# Patient Record
Sex: Female | Born: 1975 | Race: Black or African American | Hispanic: No | Smoking: Former smoker
Health system: Southern US, Community
[De-identification: ages and names within clinical notes are randomized; demographics above are authoritative.]

## PROBLEM LIST (undated history)

## (undated) DIAGNOSIS — K8020 Calculus of gallbladder without cholecystitis without obstruction: Secondary | ICD-10-CM

## (undated) DIAGNOSIS — D249 Benign neoplasm of unspecified breast: Secondary | ICD-10-CM

## (undated) DIAGNOSIS — J209 Acute bronchitis, unspecified: Secondary | ICD-10-CM

## (undated) DIAGNOSIS — K625 Hemorrhage of anus and rectum: Secondary | ICD-10-CM

## (undated) DIAGNOSIS — T7840XA Allergy, unspecified, initial encounter: Secondary | ICD-10-CM

## (undated) DIAGNOSIS — K5790 Diverticulosis of intestine, part unspecified, without perforation or abscess without bleeding: Secondary | ICD-10-CM

## (undated) DIAGNOSIS — E039 Hypothyroidism, unspecified: Secondary | ICD-10-CM

## (undated) DIAGNOSIS — E119 Type 2 diabetes mellitus without complications: Secondary | ICD-10-CM

## (undated) DIAGNOSIS — R748 Abnormal levels of other serum enzymes: Secondary | ICD-10-CM

## (undated) DIAGNOSIS — E669 Obesity, unspecified: Secondary | ICD-10-CM

## (undated) DIAGNOSIS — K760 Fatty (change of) liver, not elsewhere classified: Secondary | ICD-10-CM

## (undated) DIAGNOSIS — Z8489 Family history of other specified conditions: Secondary | ICD-10-CM

## (undated) DIAGNOSIS — E079 Disorder of thyroid, unspecified: Secondary | ICD-10-CM

## (undated) DIAGNOSIS — K648 Other hemorrhoids: Secondary | ICD-10-CM

## (undated) DIAGNOSIS — D649 Anemia, unspecified: Secondary | ICD-10-CM

## (undated) HISTORY — PX: ABDOMINAL HYSTERECTOMY: SHX81

## (undated) HISTORY — DX: Calculus of gallbladder without cholecystitis without obstruction: K80.20

## (undated) HISTORY — DX: Diverticulosis of intestine, part unspecified, without perforation or abscess without bleeding: K57.90

## (undated) HISTORY — DX: Anemia, unspecified: D64.9

## (undated) HISTORY — PX: TUBAL LIGATION: SHX77

## (undated) HISTORY — DX: Hemorrhage of anus and rectum: K62.5

## (undated) HISTORY — DX: Allergy, unspecified, initial encounter: T78.40XA

## (undated) HISTORY — DX: Abnormal levels of other serum enzymes: R74.8

## (undated) HISTORY — DX: Type 2 diabetes mellitus without complications: E11.9

## (undated) HISTORY — DX: Benign neoplasm of unspecified breast: D24.9

## (undated) HISTORY — DX: Fatty (change of) liver, not elsewhere classified: K76.0

## (undated) HISTORY — DX: Obesity, unspecified: E66.9

## (undated) HISTORY — DX: Acute bronchitis, unspecified: J20.9

## (undated) HISTORY — PX: BREAST SURGERY: SHX581

## (undated) HISTORY — DX: Disorder of thyroid, unspecified: E07.9

## (undated) HISTORY — DX: Other hemorrhoids: K64.8

## (undated) HISTORY — PX: CYSTOSCOPY: SUR368

---

## 1997-12-17 ENCOUNTER — Other Ambulatory Visit: Admission: RE | Admit: 1997-12-17 | Discharge: 1997-12-17 | Payer: Self-pay | Admitting: Obstetrics and Gynecology

## 2000-05-31 ENCOUNTER — Ambulatory Visit (HOSPITAL_COMMUNITY): Admission: RE | Admit: 2000-05-31 | Discharge: 2000-05-31 | Payer: Self-pay | Admitting: *Deleted

## 2000-05-31 ENCOUNTER — Encounter: Payer: Self-pay | Admitting: *Deleted

## 2000-08-18 ENCOUNTER — Inpatient Hospital Stay (HOSPITAL_COMMUNITY): Admission: AD | Admit: 2000-08-18 | Discharge: 2000-08-20 | Payer: Self-pay | Admitting: Obstetrics and Gynecology

## 2001-03-02 ENCOUNTER — Ambulatory Visit (HOSPITAL_COMMUNITY): Admission: RE | Admit: 2001-03-02 | Discharge: 2001-03-02 | Payer: Self-pay | Admitting: *Deleted

## 2001-03-02 ENCOUNTER — Encounter: Payer: Self-pay | Admitting: *Deleted

## 2002-03-28 ENCOUNTER — Other Ambulatory Visit: Admission: RE | Admit: 2002-03-28 | Discharge: 2002-03-28 | Payer: Self-pay | Admitting: *Deleted

## 2002-05-23 ENCOUNTER — Ambulatory Visit (HOSPITAL_COMMUNITY): Admission: RE | Admit: 2002-05-23 | Discharge: 2002-05-23 | Payer: Self-pay | Admitting: Obstetrics and Gynecology

## 2002-05-23 ENCOUNTER — Encounter: Payer: Self-pay | Admitting: Obstetrics and Gynecology

## 2002-10-25 ENCOUNTER — Inpatient Hospital Stay (HOSPITAL_COMMUNITY): Admission: AD | Admit: 2002-10-25 | Discharge: 2002-10-27 | Payer: Self-pay | Admitting: Obstetrics and Gynecology

## 2004-05-06 ENCOUNTER — Other Ambulatory Visit: Admission: RE | Admit: 2004-05-06 | Discharge: 2004-05-06 | Payer: Self-pay | Admitting: Obstetrics and Gynecology

## 2004-05-07 ENCOUNTER — Ambulatory Visit (HOSPITAL_COMMUNITY): Admission: RE | Admit: 2004-05-07 | Discharge: 2004-05-07 | Payer: Self-pay | Admitting: Obstetrics and Gynecology

## 2004-09-30 ENCOUNTER — Inpatient Hospital Stay (HOSPITAL_COMMUNITY): Admission: AD | Admit: 2004-09-30 | Discharge: 2004-10-02 | Payer: Self-pay | Admitting: Obstetrics and Gynecology

## 2004-10-01 ENCOUNTER — Encounter (INDEPENDENT_AMBULATORY_CARE_PROVIDER_SITE_OTHER): Payer: Self-pay | Admitting: Specialist

## 2007-10-10 ENCOUNTER — Encounter (INDEPENDENT_AMBULATORY_CARE_PROVIDER_SITE_OTHER): Payer: Self-pay | Admitting: Obstetrics and Gynecology

## 2007-10-10 ENCOUNTER — Inpatient Hospital Stay (HOSPITAL_COMMUNITY): Admission: AD | Admit: 2007-10-10 | Discharge: 2007-10-10 | Payer: Self-pay | Admitting: Obstetrics and Gynecology

## 2008-08-02 ENCOUNTER — Emergency Department (HOSPITAL_COMMUNITY): Admission: EM | Admit: 2008-08-02 | Discharge: 2008-08-02 | Payer: Self-pay | Admitting: Emergency Medicine

## 2008-08-11 ENCOUNTER — Emergency Department (HOSPITAL_COMMUNITY): Admission: EM | Admit: 2008-08-11 | Discharge: 2008-08-11 | Payer: Self-pay | Admitting: Emergency Medicine

## 2009-07-20 ENCOUNTER — Emergency Department (HOSPITAL_COMMUNITY): Admission: EM | Admit: 2009-07-20 | Discharge: 2009-07-20 | Payer: Self-pay | Admitting: Emergency Medicine

## 2009-08-13 ENCOUNTER — Encounter (INDEPENDENT_AMBULATORY_CARE_PROVIDER_SITE_OTHER): Payer: Self-pay | Admitting: *Deleted

## 2009-08-13 ENCOUNTER — Ambulatory Visit: Payer: Self-pay | Admitting: Obstetrics & Gynecology

## 2009-08-13 LAB — CONVERTED CEMR LAB
Chlamydia, DNA Probe: NEGATIVE
GC Probe Amp, Genital: NEGATIVE

## 2009-08-14 ENCOUNTER — Encounter: Payer: Self-pay | Admitting: Physician Assistant

## 2009-08-14 LAB — CONVERTED CEMR LAB
Platelets: 436 10*3/uL — ABNORMAL HIGH (ref 150–400)
RBC: 4.15 M/uL (ref 3.87–5.11)
WBC: 7.1 10*3/uL (ref 4.0–10.5)

## 2009-10-15 ENCOUNTER — Ambulatory Visit: Payer: Self-pay | Admitting: Obstetrics & Gynecology

## 2009-11-10 ENCOUNTER — Inpatient Hospital Stay (HOSPITAL_COMMUNITY)
Admission: RE | Admit: 2009-11-10 | Discharge: 2009-11-12 | Payer: Self-pay | Source: Home / Self Care | Admitting: Obstetrics & Gynecology

## 2009-11-10 ENCOUNTER — Encounter: Payer: Self-pay | Admitting: Obstetrics & Gynecology

## 2009-11-10 HISTORY — PX: ABDOMINAL HYSTERECTOMY: SHX81

## 2009-12-05 ENCOUNTER — Ambulatory Visit: Payer: Self-pay | Admitting: Obstetrics & Gynecology

## 2010-01-04 HISTORY — PX: COLONOSCOPY: SHX174

## 2010-03-06 ENCOUNTER — Ambulatory Visit: Payer: Self-pay | Admitting: Obstetrics and Gynecology

## 2010-03-17 LAB — CBC
HCT: 27.4 % — ABNORMAL LOW (ref 36.0–46.0)
Hemoglobin: 8.7 g/dL — ABNORMAL LOW (ref 12.0–15.0)
MCHC: 31.9 g/dL (ref 30.0–36.0)
MCHC: 32.6 g/dL (ref 30.0–36.0)
MCV: 80.4 fL (ref 78.0–100.0)
Platelets: 334 10*3/uL (ref 150–400)
RDW: 21.5 % — ABNORMAL HIGH (ref 11.5–15.5)
RDW: 22.3 % — ABNORMAL HIGH (ref 11.5–15.5)
WBC: 8.9 10*3/uL (ref 4.0–10.5)

## 2010-03-17 LAB — SURGICAL PCR SCREEN
MRSA, PCR: NEGATIVE
Staphylococcus aureus: NEGATIVE

## 2010-03-17 LAB — PREGNANCY, URINE: Preg Test, Ur: NEGATIVE

## 2010-03-17 LAB — TYPE AND SCREEN: ABO/RH(D): O POS

## 2010-03-21 LAB — URINALYSIS, ROUTINE W REFLEX MICROSCOPIC
Bilirubin Urine: NEGATIVE
Glucose, UA: NEGATIVE mg/dL
Leukocytes, UA: NEGATIVE
Nitrite: NEGATIVE
Specific Gravity, Urine: 1.025 (ref 1.005–1.030)
pH: 5.5 (ref 5.0–8.0)

## 2010-03-21 LAB — DIFFERENTIAL
Basophils Absolute: 0 10*3/uL (ref 0.0–0.1)
Eosinophils Absolute: 0.1 10*3/uL (ref 0.0–0.7)
Eosinophils Relative: 1 % (ref 0–5)
Neutrophils Relative %: 89 % — ABNORMAL HIGH (ref 43–77)

## 2010-03-21 LAB — CBC
MCH: 22.1 pg — ABNORMAL LOW (ref 26.0–34.0)
MCV: 70.7 fL — ABNORMAL LOW (ref 78.0–100.0)
Platelets: 350 10*3/uL (ref 150–400)
RDW: 19.3 % — ABNORMAL HIGH (ref 11.5–15.5)
WBC: 13.4 10*3/uL — ABNORMAL HIGH (ref 4.0–10.5)

## 2010-03-21 LAB — URINE MICROSCOPIC-ADD ON

## 2010-03-21 LAB — POCT I-STAT, CHEM 8
HCT: 30 % — ABNORMAL LOW (ref 36.0–46.0)
Hemoglobin: 10.2 g/dL — ABNORMAL LOW (ref 12.0–15.0)
Sodium: 138 mEq/L (ref 135–145)
TCO2: 25 mmol/L (ref 0–100)

## 2010-03-21 LAB — POCT PREGNANCY, URINE: Preg Test, Ur: NEGATIVE

## 2010-03-21 LAB — GC/CHLAMYDIA PROBE AMP, GENITAL: GC Probe Amp, Genital: NEGATIVE

## 2010-04-12 LAB — CBC
MCHC: 33.1 g/dL (ref 30.0–36.0)
RBC: 4.28 MIL/uL (ref 3.87–5.11)
WBC: 5.7 10*3/uL (ref 4.0–10.5)

## 2010-04-12 LAB — DIFFERENTIAL
Basophils Relative: 0 % (ref 0–1)
Lymphocytes Relative: 31 % (ref 12–46)
Lymphs Abs: 1.8 10*3/uL (ref 0.7–4.0)
Monocytes Relative: 4 % (ref 3–12)
Neutro Abs: 3.5 10*3/uL (ref 1.7–7.7)
Neutrophils Relative %: 61 % (ref 43–77)

## 2010-04-12 LAB — POCT I-STAT, CHEM 8
BUN: <3 mg/dL — ABNORMAL LOW (ref 6–23)
Potassium: 3.9 mEq/L (ref 3.5–5.1)
Sodium: 139 mEq/L (ref 135–145)
TCO2: 24 mmol/L (ref 0–100)

## 2010-04-12 LAB — TSH: TSH: 0.009 u[IU]/mL — ABNORMAL LOW (ref 0.350–4.500)

## 2010-05-22 NOTE — H&P (Signed)
NAMENIEVE, ROJERO                             ACCOUNT NO.:  [id number]   MEDICAL RECORD NO.:  [id number]                   PATIENT TYPE:  INP   LOCATION:  9162                                 FACILITY:  [facility]   PHYSICIAN:  Crist Fat. Rivard, M.D.              DATE OF BIRTH:  1975/04/18   DATE OF ADMISSION:  10/25/2002  DATE OF DISCHARGE:                                HISTORY & PHYSICAL   Ms. Allyne Gee is a 35 year old single black female gravida 4, para 2, 0, 1, 2,  at 40-3/7 weeks who presents with regular uterine contractions this evening.  She denies leaking, bleeding, headache, nausea, vomiting or visual  disturbances.  Her pregnancy has been followed through the [facility]  OB/GYN Certified Nurse Midwife service and has been remarkable for:  1. First trimester bleeding.  2. Smoker.  3. Group B strep positive.    PRENATAL LABS:  Her prenatal labs were collected on March 28, 2002.  Hemoglobin 13.1, hematocrit 39.9, platelets 335,000, blood type O positive,  antibody negative.  Sickle cell trait negative.  RPR nonreactive.  Rubella  immune.  Hepatitis B surface antigen negative.  HIV nonreactive.  Pap smear  within normal limits.  Gonorrhea negative, chlamydia negative.  On May 07, 2002 her quad screen was within normal limits.  One hour Glucola  on August 02, 2002 was 116 and her hemoglobin at that time was 11.4, and her  RPR at that time was nonreactive.  Culture of the vaginal tract for group B  strep, gonorrhea and chlamydia was done on September 24, 2002, gonorrhea and  chlamydia were negative, group B strep was positive.   HISTORY OF PRESENT PREGNANCY:  She presented for care on March 28, 2002 at  [redacted] weeks gestation.  Pregnancy fetal ultrasonography at [redacted] weeks gestation  showed growth consistent with previous dating.  The rest of her prenatal  care was unremarkable.   OBSTETRIC HISTORY:  She is a gravida 4, para 2, 0, 1, 2.  In 1994 she  vaginally delivered a female  infant at [redacted] weeks gestation weighing 7 pounds  and 4 ounces after eight hours of labor.  In 2002 she vaginally delivered a  female infant at 80 and 3/[redacted] weeks gestation weighing 7 pounds and 9 ounces  after ten hours of labor, she had no anesthesia.  In September, 2203 she had  a first trimester spontaneous AB with no D&C.  She had anemia with her  previous pregnancies but no other complications with the pregnancies or the  births.   ALLERGIES:  She has no medication allergies.   MEDICATIONS:  She has used condoms and foams for contraception.   PAST MEDICAL HISTORY:  She reports having had group B strep with her second  pregnancy.   SOCIAL HISTORY:  The patient smokes approximately one pack a day. She denies  any alcohol or illicit drug  use with the pregnancy. The patient is single,  father of the baby is involved and supportive.  The patient has a high  school education and is employed.   SURGICAL HISTORY:  Negative.   FAMILY HISTORY:  She has a family history of arthritis.  Her father and  maternal grandmother both have diabetes.  Mother has thyroid disease.   GENETIC HISTORY:  Remarkable for someone in the family with sickle cell  trait, also patient's daughter has sickle cell trait.   PHYSICAL EXAMINATION:  VITAL SIGNS:  Stable, she is afebrile.  HEENT:  Grossly within normal limits.  CHEST:  Clear to auscultation.  HEART:  Regular rate and rhythm.  ABDOMEN:  Is gravid in contour with fundal height extending approximately 40  cm above the pubic symphysis.  Fetal heart rate is reactive and reassuring.  Uterine contractions every five minutes.  CERVICAL EXAM:  Dilated 6 cm, 80%, vertex -2 with bulging bag of water that  ruptured with exam, for clear fluid.  EXTREMITIES:  Within normal limits.   ASSESSMENT:  1. Intrauterine pregnancy at term.  2. Active labor.  3. Group B strep positive.   PLAN:  1. Admit to birthing suite for consult with Dr. Estanislado Pandy.  2. Routine  orders.  3. Anticipate normal spontaneous birth.  4. Plan Penicillin G for group B strep.     Cam Hai, C.N.M.                     Crist Fat Rivard, M.D.    KS/MEDQ  D:  10/25/2002  T:  10/25/2002  Job:  [id number]

## 2010-05-22 NOTE — Op Note (Signed)
Kim Delgado, Kim Delgado NO.:  [id number]   MEDICAL RECORD NO.:  [id number]          PATIENT TYPE:  INP   LOCATION:  9118                          FACILITY:  [facility]   PHYSICIAN:  Janine Limbo, M.D.DATE OF BIRTH:  July 29, 1975   DATE OF PROCEDURE:  10/01/2004  DATE OF DISCHARGE:                                 OPERATIVE REPORT   PREOPERATIVE DIAGNOSES:  1.  Postpartum day #1.  2.  Desires sterilization.   POSTOPERATIVE DIAGNOSES:  1.  Postpartum day #1.  2.  Desires sterilization.   PROCEDURE:  Postpartum bilateral tubal ligation.   SURGEON:  Janine Limbo, M.D.   FIRST ASSISTANT:  None.   ANESTHETIC:  General.   DISPOSITION:  [name] is a 35 year old female who is now day #1 from a  vaginal delivery of a healthy female.  She is now para 4-0-1-4.  The patient  desires permanent sterilization.  She understands the indications for her  surgical procedure and she accepts the risk of, but not limited to,  anesthetic complications, bleeding, infections, and possible damage to the  surrounding organs.  She understands that there is a small but real failure  rate associated with tubal ligation (17 per 1000).   FINDINGS:  The fallopian tubes were normal bilaterally.   PROCEDURE:  The patient was taken to the operating room, where a general  anesthetic was given.  The patient's abdomen was prepped with multiple  layers of Betadine and then sterilely draped.  The subumbilical area was  injected with 6 mL of 0.5% Marcaine with epinephrine.  An incision was made  in the subumbilical area and carried sharply through the subcutaneous  tissue, fascia, and the anterior peritoneum.  The left fallopian tube was  identified and followed to its fimbriated end.  A knuckle of tube was made  on the left using a free tie and then a suture ligature of 0 plain catgut.  The knuckle of tube thus made was excised.  Hemostasis was adequate.  An identical procedure was carried  out on the  opposite side.  Again hemostasis was adequate.  All instruments were then  removed.  The anterior peritoneum and the fascia were closed using a running  suture of 2-0 Vicryl.  The skin was reapproximated using a subcuticular  suture of 4-0 Monocryl.  Sponge, needle,  and instrument counts were correct on two occasions.  Estimated blood loss was 1 mL.  The patient tolerated her procedure well.  She was awakened from her anesthetic and taken to the recovery room in  stable condition.      Janine Limbo, M.D.  Electronically Signed     AVS/MEDQ  D:  10/01/2004  T:  10/01/2004  Job:  [id number]

## 2010-05-22 NOTE — Discharge Summary (Signed)
Kim Delgado NO.:  [id number]   MEDICAL RECORD NO.:  [id number]          PATIENT TYPE:  INP   LOCATION:  9118                          [facility]ACILITY:  [facility]   PHYSICIAN:  Osborn Coho, M.D.   DATE OF BIRTH:  11-25-1975   DATE OF ADMISSION:  09/30/2004  DATE OF DISCHARGE:  10/02/2004                                 DISCHARGE SUMMARY   ADMISSION DIAGNOSES:  1.  Intrauterine pregnancy at term.  2.  Active labor.  3.  Desires sterilization.   DISCHARGE DIAGNOSES:  1.  Intrauterine pregnancy at term.  2.  Sterilization performed.   PROCEDURES:  1.  Normal spontaneous vaginal birth.  2.  Postpartum bilateral tubal ligation.  3.  General anesthesia.   HOSPITAL COURSE:  Kim Delgado is a 35 year old, G5, P3-0-1-3 at 40-1/7 weeks  who presented in advanced active labor on the afternoon of September 30, 2004.  Her pregnancy had been remarkable for late to prenatal care, smoker,  obesity, positive Group B Streptococcus, desires sterilization.  On  admission, cervix was 9 cm.  She was given 1 dose of IV ampicillin and one  dose of Stadol per patient request.  She then progressed to completely  dilated at 7:32 p.m. and delivered at 7:38 p.m. over an intact perineum.  The infant was a viable female with weight 7 pounds 11 ounces, Apgar's were  9 and 9.  The patient was taken to the recovery phase in good condition.  The infant was taken to full term nursery.   By postpartum day #1, the patient was doing well.  She was anxious about  being put to sleep for her tubal, but did continue to consent for the  procedure.  Dr. Stefano Gaul performed the tubal ligation on October 01, 2004,  under general anesthesia.  The patient tolerated the procedure well and  continued to do well throughout the rest of her hospitalization.  Her  hemoglobin on day #1, post delivery was 11.3.  White blood cell count 14.7  and platelets were 286.  By postpartum day #2 and postop day #1, the  patient  was doing well.  She was up ad lib.  She was tolerating a regular diet.  Her  incision was clean, dry and intact.  Her physical exam was within normal  limits.  She was deemed to have received full benefit of her hospital stay  and was discharged home.   SPECIAL INSTRUCTIONS:  Discharge instructions per [facility]  handout.   DISCHARGE MEDICATIONS:  1.  Motrin 600 mg p.o. q.6h. p.r.n. pain.  2.  Tylox one to two p.o. q.3-4h. p.r.n. pain.   FOLLOW UP:  Followup will occur in 6 weeks at [facility].   CONDITION ON DISCHARGE:  Stable.      Kim Delgado, C.N.M.      Osborn Coho, M.D.  Electronically Signed    VLL/MEDQ  D:  10/02/2004  T:  10/02/2004  Job:  [id number]

## 2010-05-22 NOTE — H&P (Signed)
[facility]  Patient:    Kim Delgado, Kim Delgado                          MRN: [id number] Adm. Date:  [id number] Attending:  Leonard Schwartz Dictator:   Nigel Bridgeman, C.N.M.                         History and Physical  HISTORY OF PRESENT ILLNESS:   The patient is 35 year old, gravida 3, para 1-0-1-1 at 40-3/7 weeks, who presents with uterine contractions since early last evening.  She denies any leaking or bleeding and reports positive fetal movement.  Pregnancy is remarkable for:                               1. Late transfer to [facility] from                                  Dr. Holley Raring office.                               2. Positive group B strep.                               3. Late to care with care beginning at                                  Dr. Holley Raring office at 27 weeks.                               4. Smoker.                               5. Increased body habitus.  PRENATAL LABORATORY DATA:     Blood type is O positive.  Rh antibody negative. VDRL nonreactive.  Rubella titer positive.  Hepatitis B surface antigen negative.  HIV nonreactive.  GC and Chlamydia cultures were negative.  Group B strep culture was positive in May.  Hemoglobin upon entry into practice was 11. It was 11.9 at 36 weeks.  Glucola was normal.  Pap was normal.  EDC of August 15, 2000, was established by last menstrual period and was in agreement with late ultrasound at 29 weeks.  HISTORY OF PRESENT PREGNANCY:                    The patient entered care at [facility] at 36 weeks.  She did not start prenatal care until approximately 27 weeks. Her pregnancy was essentially uncomplicated.  OBSTETRICAL HISTORY:          In 1994, she had a vaginal delivery of a female infant, weight 7 pounds 4 ounces at [redacted] weeks gestation.  She was in labor approximately eight hours.  She had epidural anesthesia.  She had no complications.  She was treated with iron  for anemia.  PAST MEDICAL HISTORY:         She is a previous condom and foam user.  Her  only other hospitalization was for childbirth.  ALLERGIES:                    She has no known medication allergies.  FAMILY HISTORY:               She has a family history of arthritis.  Her father has diabetes.  Her maternal grandmother has diabetes.  There is a family history of thyroid disease and a family history of sickle cell trait.  SOCIAL HISTORY:               The patient is single.  The father of the baby was not involved in the early part of her pregnancy.  He is questionably involved at this point.  The patient is high school educated.  She is employed as Curator.  She is African-American.  She has been followed by the Certified Nurse-Midwife Service, [place].  She denies any alcohol or drug use during this pregnancy.  She has been a 3/4 pack-per-day smoker.  PHYSICAL EXAMINATION:  VITAL SIGNS:                  Vital signs are stable.  The patient is afebrile.  HEENT:                        Within normal limits.  LUNGS:                        Bilateral breath sounds are clear.  HEART:                        Regular rate and rhythm without murmur.  BREASTS:                      Soft and nontender.  ABDOMEN:                      Fundal height is approximately 39 cm. Estimated fetal weight is 7-8 pounds.  Uterine contractions are every 2 to 4-5 minutes, moderate quality.  PELVIC:                       Cervical examination 5-6 cm, 80%, vertex at as -1 station with bulging bag of waters.  Fetal heart rate is reassuring with no decelerations.  EXTREMITIES:                  Deep tendon reflexes are 2+ without clonus. There is a edema noted.  IMPRESSION:                   1. Intrauterine pregnancy, at 40-3/7 weeks.                               2. Active labor.                               3. Positive group B streptococcus.                                4. Smoker.  PLAN:  1. Admit to birthing suite for consult with                                  Dr. Jaymes Graff as attending physician.                               2. Routine Certified Nurse-Midwife orders.                               3. Plan group B strep prophylaxis with                                  penicillin G per standard dosing.                               4. Will plan epidural or IV pain medication                                  per patient request. DD:  08/18/00 TD:  08/18/00 Job: [id number]

## 2010-05-22 NOTE — H&P (Signed)
[name], [name]                   ACCOUNT NO.:  [id number]   MEDICAL RECORD NO.:  [id number]          PATIENT TYPE:  INP   LOCATION:  9164                          [facility]ACILITY:  [facility]   PHYSICIAN:  Kim Delgado, M.D. DATE OF BIRTH:  20-Mar-1975   DATE OF ADMISSION:  09/30/2004  DATE OF DISCHARGE:                                HISTORY & PHYSICAL   HISTORY OF PRESENT ILLNESS:  Kim Delgado is a 35 year old gravida 5, para 3-0-  1-3,  who presents at 40-1/7 weeks, EDD September 29, 2004, in advanced  active labor.  Her contractions have been increasing in frequency and  intensity this afternoon.  She reports no vaginal bleeding.  Spontaneous  rupture of membranes occurred in MAU on admission for clear fluid.  She  denies any headache, visual changes, or epigastric pain.  Her pregnancy has  been followed by the CNM service at [facility] and is remarkable for:  1.  Late  prenatal care; 2.  Smoker; 3.  Obesity; 4.  Group B Strep positive; 5.  Desires sterilization.  This patient entered prenatal care at [facility] at 18  weeks and 6 days, EDC determined by ultrasound and confirmed with follow-up.  Her pregnancy has been essentially unremarkable.  She has been size equal to  dates throughout.  She has been normotensive with no proteinuria.   LABORATORY DATA:  Prenatal lab work on May 06, 2004:  Hemoglobin and  hematocrit 10.9 and 33.8, platelets 282,000, blood type and Rh O positive,  antibody screen negative.  Sickle cell trait negative.  VDRL nonreactive.  Rubella immune.  Hepatitis B surface antigen negative.  HIV nonreactive.  Quad screen within normal limits.  At 28 weeks, one hour glucose challenge  119, hemoglobin 11.4.  At 36 weeks, culture of the vaginal tract is positive  for group B Strep, negative for GC and Chlamydia.   ALLERGIES:  The patient has no known drug allergies.   HABITS:  She smokes approximately one pack of cigarettes per day and denies  the use of alcohol or illicit drugs.   OB HISTORY:  In 1994 the patient had a normal spontaneous vaginal delivery  with the birth of a 7 pound 4 ounce female infant named Kim Delgado with no  complications.  In 2002 the patient had a normal spontaneous vaginal  delivery at term with the birth of a 7 pound 9 ounce female infant named  Kim Delgado with no complications.  In 2003 she had a first trimester SAB with  no D&E and no complications.  In 2004 she had a normal spontaneous vaginal  delivery with the birth of an 8 pound 10 ounce female infant named Kim Delgado with  no complications, and the present pregnancy.   PAST MEDICAL HISTORY:  Unremarkable except for obesity.   FAMILY HISTORY:  Maternal grandmother and patient's father with a history of  diabetes.  Patient's mother with a history of thyroid disease.   GENETIC HISTORY:  The patient's daughter has sickle cell trait, otherwise  unremarkable.   SOCIAL HISTORY:  Kim Delgado is a 35 year old African-American  female.  Her  husband, Marielouise Amey, is involved and supportive.  They do not subscribe to  a religious faith.   REVIEW OF SYSTEMS:  As described above.  The patient is typical of one with  uterine pregnancy at term in advanced active labor.   PHYSICAL EXAMINATION:  VITAL SIGNS:  Stable.  Afebrile.  HEENT:  Unremarkable.  HEART:  Regular rate and rhythm.  LUNGS:  Clear.  ABDOMEN:  Gravid in its contour.  Uterine fundus is noted to extend 40 cm  above the level of the pubic symphysis.  Leopold's maneuver finds the infant  to be in a longitudinalized cephalic presentation and the estimated fetal  weight is 8 pounds.  Digital exam of the cervix finds it to be 9 cm dilated,  completely effaced with the cephalic presenting part a -2 station.  Membranes have ruptured for clear fluid.  The patient is contracting every 3-  4 minutes.  EXTREMITIES:  Show no pathologic edema.  DTRs are 1+ with no clonus.   ASSESSMENT:  1.  Intrauterine pregnancy at term.  2.  Active labor.    PLAN:  Admit per Dr. Jaymes Graff.  Routine CNM orders.  Ampicillin 2 g IV  x1 now due to advanced active labor.  Anticipate spontaneous vaginal  delivery.      Rica Koyanagi, C.N.M.      Kim A. Normand Sloop, M.D.  Electronically Signed    SDM/MEDQ  D:  09/30/2004  T:  09/30/2004  Job:  [id number]

## 2010-07-06 ENCOUNTER — Other Ambulatory Visit: Payer: Self-pay | Admitting: Radiology

## 2010-07-16 ENCOUNTER — Encounter (INDEPENDENT_AMBULATORY_CARE_PROVIDER_SITE_OTHER): Payer: Self-pay | Admitting: General Surgery

## 2010-07-23 ENCOUNTER — Ambulatory Visit (INDEPENDENT_AMBULATORY_CARE_PROVIDER_SITE_OTHER): Payer: 59 | Admitting: General Surgery

## 2010-07-23 ENCOUNTER — Encounter (INDEPENDENT_AMBULATORY_CARE_PROVIDER_SITE_OTHER): Payer: Self-pay | Admitting: General Surgery

## 2010-07-23 VITALS — BP 126/82 | HR 84 | Temp 97.8°F | Ht 68.0 in | Wt 267.8 lb

## 2010-07-23 DIAGNOSIS — D249 Benign neoplasm of unspecified breast: Secondary | ICD-10-CM

## 2010-07-23 NOTE — Progress Notes (Signed)
Subjective:     Patient ID: Kim Delgado, female   DOB: [birth year], 35 y.o.   MRN: [id number]  HPI This is a 35 year old African American female, in good health other than tobacco use and obesity. She is referred by [facility] health, Dr. 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  The patient thought she felt a left breast mass and reported that to her gynecologist. She said that mass has now resolved. She had mammograms and ultrasound. There was no abnormality in the left breast, but in the right breast in the subareolar area there was a 1.7 x 0.8 cm solid density at the 1:00 position in the subareolar location. This was biopsied and shows an intraductal papilloma.  The patient has not had any pain or perceived mass in the right breast. There is been no nipple discharge. She has not had any prior breast problems. There is no family history of breast cancer.  She is here today to decide about whether to have excisional biopsy of this area.  Past Medical History  Diagnosis Date  . Papilloma of breast   . Rectal bleeding     Current outpatient prescriptions:Calcium Carbonate-Vitamin D (CALCIUM PLUS VITAMIN D PO), Take 500 mg by mouth 3 (three) times a week.  , Disp: , Rfl: ;  pediatric multivitamin-iron (POLY-VI-SOL WITH IRON) 15 MG chewable tablet, Chew 1 tablet by mouth daily.  , Disp: , Rfl:   No Known Allergies  Family History  Problem Relation Age of Onset  . Transient ischemic attack Mother   . Breast cancer Maternal Aunt   . Throat cancer Maternal Aunt   . Lung cancer Paternal Aunt     History  Substance Use Topics  . Smoking status: Current Everyday Smoker -- 0.3 packs/day    Types: Cigarettes  . Smokeless tobacco: Not on file  . Alcohol Use: No      Review of Systems  Constitutional: Negative.   HENT: Negative.   Eyes: Negative.   Respiratory: Negative.     Cardiovascular: Negative.   Gastrointestinal: Negative.   Genitourinary: Negative.   Musculoskeletal: Negative.   Skin: Negative.   Neurological: Negative.   Hematological: Negative.   Psychiatric/Behavioral: Negative.        Objective:   Physical Exam  Constitutional: She is oriented to person, place, and time. She appears well-developed and well-nourished. No distress.  HENT:  Head: Atraumatic.  Mouth/Throat: No oropharyngeal exudate.  Eyes: Conjunctivae are normal. Pupils are equal, round, and reactive to light. No scleral icterus.  Neck: Neck supple. No JVD present. No thyromegaly present.  Cardiovascular: Normal rate and regular rhythm.   No murmur heard. Pulmonary/Chest: Effort normal and breath sounds normal. No respiratory distress. She has no wheezes. She has no rales. She exhibits no tenderness.    Abdominal: Soft. Bowel sounds are normal. She exhibits no distension and no mass. There is no rebound and no guarding.    Musculoskeletal: Normal range of motion. She exhibits no edema and no tenderness.  Lymphadenopathy:    She has no cervical adenopathy.  Neurological: She is oriented to person, place, and time. She has normal reflexes. No cranial nerve deficit. She exhibits normal muscle tone. Coordination normal.  Skin: Skin is warm and dry. No rash noted. She is not diaphoretic. No erythema. No pallor.  Psychiatric: She has a normal mood and affect. Her behavior is normal. Judgment and thought content normal.  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  Tobacco use.  Obesity.   Plan:     Scheduled for right breast biopsy with needle localization in the near future.  I discussed the diagnosis with the patient. I have discussed the indications and details of the biopsy with her. Risks and complications have been outlined, including but limited to bleeding, infection, skin  necrosis, cosmetic deformity, reoperation if cancer is found, nerve damage with numbness or chronic pain, and other increasing problems. She seems to understand these issues well. His tunneled or questions are answered. She is infiltrated with this plan.

## 2010-07-23 NOTE — Patient Instructions (Signed)
You have a biopsy proven papilloma in the right breast in the subareolar area. We will schedule you to have a right breast biopsy with needle localization in the near future.

## 2010-08-07 ENCOUNTER — Encounter (HOSPITAL_COMMUNITY)
Admission: RE | Admit: 2010-08-07 | Discharge: 2010-08-07 | Disposition: A | Discharge status: Home / Self Care | Payer: 59 | Source: Ambulatory Visit | Attending: General Surgery | Admitting: General Surgery

## 2010-08-07 LAB — CBC
Hemoglobin: 11.9 g/dL — ABNORMAL LOW (ref 12.0–15.0)
MCH: 27.4 pg (ref 26.0–34.0)
RBC: 4.35 MIL/uL (ref 3.87–5.11)

## 2010-08-07 LAB — DIFFERENTIAL
Basophils Absolute: 0 10*3/uL (ref 0.0–0.1)
Basophils Relative: 0 % (ref 0–1)
Monocytes Relative: 4 % (ref 3–12)
Neutro Abs: 5.5 10*3/uL (ref 1.7–7.7)
Neutrophils Relative %: 58 % (ref 43–77)

## 2010-08-07 LAB — URINE MICROSCOPIC-ADD ON

## 2010-08-07 LAB — URINALYSIS, ROUTINE W REFLEX MICROSCOPIC
Glucose, UA: NEGATIVE mg/dL
pH: 6 (ref 5.0–8.0)

## 2010-08-07 LAB — COMPREHENSIVE METABOLIC PANEL
ALT: 16 U/L (ref 0–35)
AST: 15 U/L (ref 0–37)
Calcium: 9.1 mg/dL (ref 8.4–10.5)
Sodium: 140 mEq/L (ref 135–145)
Total Protein: 7.1 g/dL (ref 6.0–8.3)

## 2010-08-14 ENCOUNTER — Ambulatory Visit (HOSPITAL_COMMUNITY)
Admission: RE | Admit: 2010-08-14 | Discharge: 2010-08-14 | Disposition: A | Discharge status: Home / Self Care | Payer: 59 | Source: Ambulatory Visit | Attending: General Surgery | Admitting: General Surgery

## 2010-08-14 ENCOUNTER — Other Ambulatory Visit (INDEPENDENT_AMBULATORY_CARE_PROVIDER_SITE_OTHER): Payer: Self-pay | Admitting: General Surgery

## 2010-08-14 DIAGNOSIS — N6019 Diffuse cystic mastopathy of unspecified breast: Secondary | ICD-10-CM

## 2010-08-14 DIAGNOSIS — Z01810 Encounter for preprocedural cardiovascular examination: Secondary | ICD-10-CM | POA: Insufficient documentation

## 2010-08-14 DIAGNOSIS — D249 Benign neoplasm of unspecified breast: Secondary | ICD-10-CM | POA: Insufficient documentation

## 2010-08-14 DIAGNOSIS — Z01812 Encounter for preprocedural laboratory examination: Secondary | ICD-10-CM | POA: Insufficient documentation

## 2010-08-14 DIAGNOSIS — Z8673 Personal history of transient ischemic attack (TIA), and cerebral infarction without residual deficits: Secondary | ICD-10-CM | POA: Insufficient documentation

## 2010-08-14 HISTORY — PX: BREAST SURGERY: SHX581

## 2010-08-18 ENCOUNTER — Telehealth (INDEPENDENT_AMBULATORY_CARE_PROVIDER_SITE_OTHER): Payer: Self-pay

## 2010-08-18 NOTE — Op Note (Signed)
NAMESTEVE, Kim Delgado NO.:  [id number]  MEDICAL RECORD NO.:  [id number]  LOCATION:  SDSC                         FACILITY:  [facility]  PHYSICIAN:  Angelia Mould. Derrell Lolling, M.D.DATE OF BIRTH:  04-Nov-1975  DATE OF PROCEDURE:  08/14/2010 DATE OF DISCHARGE:  08/14/2010                              OPERATIVE REPORT   PREOPERATIVE DIAGNOSIS:  Right breast mass, subareolar, suspect papilloma.  POSTOPERATIVE DIAGNOSIS:  Right breast mass, subareolar, suspect papilloma.  OPERATION PERFORMED:  Right partial mastectomy with needle localization.  SURGEON:  Angelia Mould. Derrell Lolling, MD  OPERATIVE INDICATIONS:  This is a 35 year old African American female. She recently presented perceiving a left breast mass which then resolved.  She subsequently had mammograms and ultrasound.  There was no mass in the left, but instead, the radiologist found a 1.7 x 0.8 cm solid density in the right breast in the subareolar area at the 1 o'clock position.  This was biopsied and showed an intraductal papilloma.  She was referred for consideration of excision to exclude occult carcinoma.  On exam, her breasts were large and there was no palpable mass or nipple discharge.  She was brought to operating room electively for excision of this area.  OPERATIVE TECHNIQUE:  The patient underwent wire localization at [facility] this morning.  The wire entered from the very medial aspect of the breast and very superficially traversed across the breast into the subareolar area but was placed very nicely close to the marker clip.  The patient was then examined in the holding area and the x-rays were reviewed.  Everything was felt to be satisfactory.  She was taken to the operating room.  General endotracheal anesthesia was induced. Intravenous antibiotics were given.  The patient was identified as correct patient, correct procedure, and correct site.  The right breast was prepped and draped in a sterile  fashion.  A 0.5% Marcaine with epinephrine was used as a local infiltration anesthetic.  A curvilinear incision was made at the areolar margin medially.  Dissection was carried straight down into the breast tissue until I encountered the wire.  I then brought the wire up into the wound.  I then dissected the subareolar breast tissue around the wire tip removing about a 2.5 cm diameter area of subareolar breast tissue.  Specimen mammogram was performed and this confirmed that the marker clip and the wire were completely contained within the specimen and this looked good.  The specimen was marked with the 6 color margin marker kit and sent to pathology.  Hemostasis was excellent and achieved with electrocautery. The wound was irrigated with saline.  It should be noted there was a lot of milk ducts containing creamy fluid that were encountered.  The breast tissue was reapproximated in layers with interrupted sutures of 3-0 Vicryl.  The skin was closed with a running subcuticular suture of 4-0 Monocryl and Dermabond.  Ice pack was placed.  The patient was taken to recovery room in stable condition.  Estimated blood loss was about 10 mL.  Complications none.  Sponge, needle, and instrument counts were correct.     Angelia Mould. Derrell Lolling, M.D.  HMI/MEDQ  D:  08/14/2010  T:  08/14/2010  Job:  [id number]  cc:   Rogelia Mire, MD  Electronically Signed by Claud Kelp M.D. on 08/18/2010 04:54:09 AM

## 2010-08-18 NOTE — Telephone Encounter (Signed)
Pt advised of benign path result and given po appt for 08-31-10. Pt to call with concerns.

## 2010-08-26 ENCOUNTER — Encounter (INDEPENDENT_AMBULATORY_CARE_PROVIDER_SITE_OTHER): Payer: Self-pay | Admitting: General Surgery

## 2010-08-31 ENCOUNTER — Encounter (INDEPENDENT_AMBULATORY_CARE_PROVIDER_SITE_OTHER): Payer: 59 | Admitting: General Surgery

## 2010-09-10 ENCOUNTER — Encounter (INDEPENDENT_AMBULATORY_CARE_PROVIDER_SITE_OTHER): Payer: Self-pay | Admitting: General Surgery

## 2010-09-10 ENCOUNTER — Ambulatory Visit (INDEPENDENT_AMBULATORY_CARE_PROVIDER_SITE_OTHER): Payer: 59 | Admitting: General Surgery

## 2010-09-10 VITALS — BP 120/78 | HR 80 | Temp 96.8°F | Resp 18

## 2010-09-10 DIAGNOSIS — Z9889 Other specified postprocedural states: Secondary | ICD-10-CM

## 2010-09-10 NOTE — Progress Notes (Signed)
Subjective:     Patient ID: Kim Delgado, female   DOB: Apr 22, 1975, 35 y.o.   MRN: [id number]  HPI This 35 year old white female underwent a right partial mastectomy with needle localization August 13, 2009. Pathology report showed benign intraductal papilloma and benign adenosis. There was no evidence of atypia. I have discussed this with the patient given her a copy of the pathology report  She has no complaints about her wound. No pain or drainage no lumps. She has returned to work and back to full activities.  Review of Systems     Objective:   Physical Exam Right breast exam reveals that the wound has healed very nicely. Circumareolar scar. Tissues are very soft. No sign of induration. No sign of hematoma. No drainage. Good sensation. No deformity.    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  The patient was advised to get a mammogram at age. 39, and annually thereafter, unless any new problems arise.  Return to see me p.r.n.

## 2010-09-10 NOTE — Patient Instructions (Signed)
The biopsy of the right breast reveals a benign papilloma. There is no evidence of malignancy. You have been given a copy of the pathology report. Your wound appears to be healing well. I advise you to get a mammogram at age 35 and annually thereafter. Return to see me if any new problems arise.

## 2010-10-05 LAB — POCT PREGNANCY, URINE: Preg Test, Ur: NEGATIVE

## 2010-10-05 LAB — URINALYSIS, ROUTINE W REFLEX MICROSCOPIC
Bilirubin Urine: NEGATIVE
Hgb urine dipstick: NEGATIVE
Ketones, ur: NEGATIVE
Protein, ur: NEGATIVE
Urobilinogen, UA: 0.2

## 2010-10-05 LAB — CBC
HCT: 40.4
Platelets: 373
RBC: 4.85
WBC: 10.8 — ABNORMAL HIGH

## 2010-10-05 LAB — WET PREP, GENITAL
Trich, Wet Prep: NONE SEEN
Yeast Wet Prep HPF POC: NONE SEEN

## 2010-12-03 ENCOUNTER — Encounter: Payer: Self-pay | Admitting: Internal Medicine

## 2010-12-17 ENCOUNTER — Encounter: Payer: Self-pay | Admitting: Internal Medicine

## 2010-12-23 ENCOUNTER — Telehealth: Payer: Self-pay | Admitting: Gastroenterology

## 2010-12-23 ENCOUNTER — Ambulatory Visit (INDEPENDENT_AMBULATORY_CARE_PROVIDER_SITE_OTHER): Payer: 59 | Admitting: Internal Medicine

## 2010-12-23 ENCOUNTER — Encounter: Payer: Self-pay | Admitting: Internal Medicine

## 2010-12-23 VITALS — BP 134/78 | HR 88 | Ht 68.0 in | Wt 271.0 lb

## 2010-12-23 DIAGNOSIS — K625 Hemorrhage of anus and rectum: Secondary | ICD-10-CM | POA: Insufficient documentation

## 2010-12-23 DIAGNOSIS — K649 Unspecified hemorrhoids: Secondary | ICD-10-CM | POA: Insufficient documentation

## 2010-12-23 DIAGNOSIS — K921 Melena: Secondary | ICD-10-CM

## 2010-12-23 MED ORDER — PEG-KCL-NACL-NASULF-NA ASC-C 100 G PO SOLR: 1.0000 | Freq: Once | ORAL | Status: DC

## 2010-12-23 NOTE — Telephone Encounter (Signed)
Spoke to pt, changed her appt. for her Colonoscopy W/prop from 12/24/10 @ 2pm to same date @ 11:30am she agreed it was fine. asked about going to work later that night, i told her per her instructions she is not to return to work that day, she said she doesn't go back until later that night. I said she can talk to the Dr. Bea Laura at her appt. About that.

## 2010-12-23 NOTE — Progress Notes (Signed)
Subjective:    Patient ID: Kim Delgado, female    DOB: 1975/07/01, 35 y.o.   MRN: [id number]  HPI Kim Delgado is a  35 yo female with PMH of endometriosis and menorrhagia who status post total hysterectomy who is seen in consultation at the request of Dr. Patsy Lager for evaluation of intermittent rectal bleeding. The patient states that she has intermittent bright red blood per rectum over the last 1 year. She reports this is always with bowel movement and is bright red in color. She reports this as painless. It is not occurring with every bowel movement. She reports that she has issues with feeling constipated if she does not drink caffeine and smoke tobacco. She reports while she is using tobacco and caffeine she has 3 bowel movements daily. She reports her bowel movements as soft, but formed and brown. No diarrhea or melena. No abdominal pain. Good appetite. Stable weight. No nausea or vomiting. No heartburn. No known history of hemorrhoids. No fevers or chills.  Review of Systems Constitutional: Negative for fever, chills, night sweats, activity change, appetite change and unexpected weight change HEENT: Negative for sore throat, mouth sores and trouble swallowing. Eyes: Negative for visual disturbance Respiratory: Negative for cough, chest tightness and shortness of breath Cardiovascular: Negative for chest pain, palpitations and lower extremity swelling Gastrointestinal: See history of present illness Genitourinary: Negative for dysuria and hematuria. Musculoskeletal: Negative for back pain, arthralgias and myalgias Skin: Negative for rash or color change Neurological: Negative for headaches, weakness, numbness Hematological: Negative for adenopathy, negative for easy bruising/bleeding Psychiatric/behavioral: Negative for depressed mood, negative for anxiety  Past Medical History  Diagnosis Date  . Papilloma of breast   . Rectal bleeding   . Anemia   . Obesity    Past Surgical History    Procedure Date  . Tubal ligation   . Cystoscopy   . Abdominal hysterectomy     BSO  . Breast surgery    Current Outpatient Prescriptions  Medication Sig Dispense Refill  . Ascorbic Acid (VITAMIN C PO) Take by mouth. Will take three times a week       . Calcium Carbonate-Vitamin D (CALCIUM PLUS VITAMIN D PO) Take 500 mg by mouth 3 (three) times a week.        . Multiple Vitamins-Iron (MULTIVITAMIN/IRON PO) Take 1 tablet by mouth. Will take three times a week       . peg 3350 powder (MOVIPREP) 100 G SOLR Take 1 kit (100 g total) by mouth once.  1 kit  0   No Known Allergies  Family History  Problem Relation Age of Onset  . Transient ischemic attack Mother   . Breast cancer Maternal Aunt   . Throat cancer Maternal Aunt   . Lung cancer Paternal Aunt   . Colon cancer Neg Hx    Social History  . Marital Status: Married    Number of Children: 4   Occupational History  . LPN    Social History Main Topics  . Smoking status: Current Everyday Smoker -- 0.3 packs/day    Types: Cigarettes  . Smokeless tobacco: Never Used   Comment: Counseling sheet given to quit smoking   . Alcohol Use: No  . Drug Use: No  . Sexually Active: None   Social History Narrative   Caffeine daily       Objective:   Physical Exam BP 134/78  Pulse 88  Ht 5\' 8"  (1.727 m)  Wt 271 lb (122.925 kg)  BMI 41.21 kg/m2 Constitutional: Well-developed and well-nourished. No distress. HEENT: Normocephalic and atraumatic. Oropharynx is clear and moist. No oropharyngeal exudate. Conjunctivae are normal. Pupils are equal round and reactive to light. No scleral icterus. Cardiovascular: Normal rate, regular rhythm and intact distal pulses. No M/R/G Pulmonary/chest: Effort normal and breath sounds normal. No wheezing, rales or rhonchi. Abdominal: Soft, mild left lower quadrant tenderness without rebound or guarding, nondistended. Bowel sounds active throughout. There are no masses palpable. No  hepatosplenomegaly. Extremities: no clubbing, cyanosis, or edema Lymphadenopathy: No cervical adenopathy noted. Neurological: Alert and oriented to person place and time. Skin: Skin is warm and dry. No rashes noted. Psychiatric: Normal mood and affect. Behavior is normal.  Urgent care visit reviewed from 11/24/2010, CBC is documented as: Do CBC 10.8 hemoglobin 12.6 platelets 352    Assessment & Plan:  35 yo female with PMH of endometriosis and menorrhagia who status post total hysterectomy who is seen in consultation at the request of Dr. Patsy Lager for evaluation of intermittent rectal bleeding.  1. Rectal bleeding -- the patient has experienced intermittent rectal bleeding over the past year. This does seem to be from a distal source, however given its painless nature it is unlikely to be external hemorrhoids or secondary to  anal fissure.  Given the chronicity of her bleeding, we will pursue colonoscopy for further evaluation and diagnosis. We discussed this test today including the risks and benefits and she is willing to proceed. We will use propofol for sedation. Further treatment will be based on findings at colonoscopy. She did ask that this test be done this calendar year for insurance reasons, and I was able to accommodate her tomorrow.

## 2010-12-23 NOTE — Patient Instructions (Addendum)
You have been given a separate informational sheet regarding your tobacco use, the importance of quitting and local resources to help you quit.  You have been scheduled for a colonoscopy. Please follow written instructions given to you at your visit today.  Please pick up your prep kit at the pharmacy within the next 2-3 days.  We have sent the following medications to your pharmacy for you to pick up at your convenience: MoviPrep

## 2010-12-24 ENCOUNTER — Encounter: Payer: Self-pay | Admitting: *Deleted

## 2010-12-24 ENCOUNTER — Encounter: Payer: Self-pay | Admitting: Internal Medicine

## 2010-12-24 ENCOUNTER — Ambulatory Visit (AMBULATORY_SURGERY_CENTER): Payer: 59 | Admitting: Internal Medicine

## 2010-12-24 ENCOUNTER — Other Ambulatory Visit: Payer: Self-pay | Admitting: Internal Medicine

## 2010-12-24 VITALS — BP 131/75 | HR 76 | Temp 96.8°F | Resp 16 | Ht 68.0 in | Wt 271.0 lb

## 2010-12-24 DIAGNOSIS — K529 Noninfective gastroenteritis and colitis, unspecified: Secondary | ICD-10-CM

## 2010-12-24 DIAGNOSIS — K625 Hemorrhage of anus and rectum: Secondary | ICD-10-CM

## 2010-12-24 DIAGNOSIS — K921 Melena: Secondary | ICD-10-CM

## 2010-12-24 MED ORDER — PRAMOXINE-HC 1-2.5 % EX CREA: TOPICAL_CREAM | Freq: Three times a day (TID) | CUTANEOUS | Status: AC

## 2010-12-24 MED ORDER — SODIUM CHLORIDE 0.9 % IV SOLN: 500.0000 mL | INTRAVENOUS | Status: DC

## 2010-12-24 NOTE — Op Note (Signed)
 [facility] [address] [place], [place]  [address]  COLONOSCOPY PROCEDURE REPORT  PATIENT:  Kim, Delgado  MR#:  [id number] BIRTHDATE:  1975-11-15, 35 yrs. old  GENDER:  female ENDOSCOPIST:  Carie Caddy. Pyrtle, MD REF. BY:  Abbe Amsterdam, M.D. PROCEDURE DATE:  12/24/2010 PROCEDURE:  Colonoscopy with biopsy ASA CLASS:  Class I INDICATIONS:  hematochezia MEDICATIONS:   MAC sedation, administered by CRNA, propofol (Diprivan) 500 mg IV  DESCRIPTION OF PROCEDURE:   After the risks benefits and alternatives of the procedure were thoroughly explained, informed consent was obtained.  Digital rectal exam was performed and revealed no rectal masses.   The LB [id number]-[id number] endoscope was introduced through the anus and advanced to the terminal ileum which was intubated for a short distance, without limitations. The quality of the prep was good, using MoviPrep.  The instrument was then slowly withdrawn as the colon was fully examined. <<PROCEDUREIMAGES>>  FINDINGS:  The mucosa was erythematous in the descending colon and sigmoid/rectosigmoid colon.  Unclear significance, query preparation artifact.  Multiple biopsies were obtained and sent to pathology.  Internal Hemorrhoids were found.   Retroflexed views in the rectum revealed no other findings other than those already described.   The scope was then withdrawn  from the cecum and the procedure completed.  COMPLICATIONS:  None ENDOSCOPIC IMPRESSION: 1) Erythema in the descending colon, sigmoid and rectosigmoid colon.  Biopsies performed and sent to pathology. 2) Internal hemorrhoids  RECOMMENDATIONS: 1) Avoid all NSAIDS for the next 2 weeks. 2) Await pathology results 3) You will receive a letter within 1-2 weeks with the results of your biopsy as well as final recommendations. Please call my office if you have not received a letter after 3 weeks. 4) Trial of protofoam as directed for internal hemorrhoids. 5) Office  followup in 4-6 weeks.  Carie Caddy. Rhea Belton, MD  CC:  Abbe Amsterdam, MD The Patient  n. eSIGNEDCarie Caddy. Pyrtle at 12/24/2010 12:37 PM  Laurence Slate, [phone]

## 2010-12-24 NOTE — Progress Notes (Signed)
Patient did not experience any of the following events: a burn prior to discharge; a fall within the facility; wrong site/side/patient/procedure/implant event; or a hospital transfer or hospital admission upon discharge from the facility. (G8907) Patient did not have preoperative order for IV antibiotic SSI prophylaxis. (G8918)  

## 2010-12-24 NOTE — Patient Instructions (Signed)
No aspirin containing products for two weeks. Just use Tylenol if you need something for pain.  Please follow all discharge instructions given to you by the recovery room nurse. If you have any questions or problems after discharge please call [phone]. You will receive a phone call in the am to see how you are doing and answer any questions you may have. Thank you for choosing [facility] for your health care needs.

## 2010-12-25 ENCOUNTER — Telehealth: Payer: Self-pay | Admitting: *Deleted

## 2010-12-25 NOTE — Telephone Encounter (Signed)
Left message on number given in admitting yesterday, ewm 

## 2010-12-31 ENCOUNTER — Telehealth: Payer: Self-pay | Admitting: Gastroenterology

## 2010-12-31 NOTE — Telephone Encounter (Signed)
Received a refill note from [facility] ? The strength of pramoxine-hydrocortisone cream. Verbal order per Dr. Rhea Belton, called in to the pharmacy 2.55-1%.

## 2011-01-01 ENCOUNTER — Encounter: Payer: Self-pay | Admitting: Internal Medicine

## 2011-01-26 ENCOUNTER — Encounter: Payer: Self-pay | Admitting: Internal Medicine

## 2011-01-28 ENCOUNTER — Ambulatory Visit: Payer: 59 | Admitting: Internal Medicine

## 2011-10-16 ENCOUNTER — Ambulatory Visit: Payer: 59

## 2011-10-16 ENCOUNTER — Ambulatory Visit: Payer: 59 | Admitting: Emergency Medicine

## 2011-10-16 VITALS — BP 115/79 | HR 73 | Temp 97.5°F | Resp 16 | Ht 68.0 in | Wt 285.0 lb

## 2011-10-16 DIAGNOSIS — G4700 Insomnia, unspecified: Secondary | ICD-10-CM

## 2011-10-16 DIAGNOSIS — S139XXA Sprain of joints and ligaments of unspecified parts of neck, initial encounter: Secondary | ICD-10-CM

## 2011-10-16 DIAGNOSIS — F17200 Nicotine dependence, unspecified, uncomplicated: Secondary | ICD-10-CM

## 2011-10-16 DIAGNOSIS — Z79899 Other long term (current) drug therapy: Secondary | ICD-10-CM

## 2011-10-16 DIAGNOSIS — B351 Tinea unguium: Secondary | ICD-10-CM

## 2011-10-16 DIAGNOSIS — Z6841 Body Mass Index (BMI) 40.0 and over, adult: Secondary | ICD-10-CM

## 2011-10-16 MED ORDER — TEMAZEPAM 30 MG PO CAPS: 30.0000 mg | ORAL_CAPSULE | Freq: Every evening | ORAL | Status: DC | PRN

## 2011-10-16 MED ORDER — HYDROCODONE-ACETAMINOPHEN 5-500 MG PO TABS: 1.0000 | ORAL_TABLET | ORAL | Status: AC | PRN

## 2011-10-16 MED ORDER — TERBINAFINE HCL 250 MG PO TABS: 250.0000 mg | ORAL_TABLET | Freq: Every day | ORAL | Status: DC

## 2011-10-16 MED ORDER — CYCLOBENZAPRINE HCL 10 MG PO TABS: 10.0000 mg | ORAL_TABLET | Freq: Three times a day (TID) | ORAL | Status: DC | PRN

## 2011-10-16 MED ORDER — NAPROXEN SODIUM 550 MG PO TABS: 550.0000 mg | ORAL_TABLET | Freq: Two times a day (BID) | ORAL | Status: AC
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
## 2011-10-17 LAB — HEPATIC FUNCTION PANEL
ALT: 20 U/L (ref 0–35)
AST: 16 U/L (ref 0–37)
Albumin: 4.2 g/dL (ref 3.5–5.2)
Total Bilirubin: 0.3 mg/dL (ref 0.3–1.2)

## 2011-10-19 ENCOUNTER — Telehealth: Payer: Self-pay

## 2011-10-19 DIAGNOSIS — F17200 Nicotine dependence, unspecified, uncomplicated: Secondary | ICD-10-CM | POA: Insufficient documentation

## 2011-10-19 DIAGNOSIS — Z6841 Body Mass Index (BMI) 40.0 and over, adult: Secondary | ICD-10-CM | POA: Insufficient documentation

## 2011-10-19 HISTORY — DX: Nicotine dependence, unspecified, uncomplicated: F17.200

## 2011-10-19 NOTE — Telephone Encounter (Signed)
Pt is requesting percoset.   Nausated with meds Dr. Dareen Piano Prescribed  Okay to lmom  Is not taking the muscle relaxer. vicodin is sickening.   Call [phone]

## 2011-10-20 NOTE — Telephone Encounter (Signed)
Dr. Anderson, please advise.

## 2011-10-21 MED ORDER — ONDANSETRON 4 MG PO TBDP: ORAL_TABLET | ORAL | Status: DC

## 2011-10-21 NOTE — Telephone Encounter (Signed)
Patient would like to know the status of this rx as she is in a lot of pain. She says she has done well with percocet in the past if that is an option for her.  Best [phone]

## 2011-10-21 NOTE — Telephone Encounter (Signed)
Pt reports that she does not like the way the muscle relaxant makes her feel mentally, and the vicodan gives her nausea. Advised pt that Dr Dareen Piano will need to review req for Percocet and he will not be in office until the morning. Pt asked if we can send in a Rx for something for nausea to take w/vicodan to see if she can tolerate it. Per Alycia Rossetti, advised pt that we can send in Zofran Rx. No need to contact pt unless there is a problem sending in Rx. Pt will CB if she can not tolerate w/Zofran and still needs to ask Dr Dareen Piano about a Percocet Rx.

## 2012-03-15 ENCOUNTER — Ambulatory Visit (INDEPENDENT_AMBULATORY_CARE_PROVIDER_SITE_OTHER): Payer: 59 | Admitting: Emergency Medicine

## 2012-03-15 VITALS — BP 116/82 | HR 84 | Temp 97.9°F | Resp 18 | Ht 67.5 in | Wt 291.0 lb

## 2012-03-15 DIAGNOSIS — E119 Type 2 diabetes mellitus without complications: Secondary | ICD-10-CM

## 2012-03-15 DIAGNOSIS — Z0000 Encounter for general adult medical examination without abnormal findings: Secondary | ICD-10-CM

## 2012-03-15 LAB — POCT CBC
Lymph, poc: 2.8 (ref 0.6–3.4)
MCH, POC: 27 pg (ref 27–31.2)
MCHC: 30.7 g/dL — AB (ref 31.8–35.4)
MID (cbc): 0.5 (ref 0–0.9)
MPV: 8.6 fL (ref 0–99.8)
POC MID %: 5.9 %M (ref 0–12)
Platelet Count, POC: 365 10*3/uL (ref 142–424)
RDW, POC: 15.1 %
WBC: 7.7 10*3/uL (ref 4.6–10.2)

## 2012-03-15 NOTE — Patient Instructions (Signed)
Your hemoglobin A1c is 7.1. It appears that you have developed diabetes. I would advise you to make an appointment at 10 for her evaluation and get started on treatment of your diabetes

## 2012-03-15 NOTE — Progress Notes (Deleted)
  Subjective:    Patient ID: Kim Delgado, female    DOB: 06-16-1975, 37 y.o.   MRN: [id number]  HPI Here for a wellness screening for her insurance. 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     Review of Systems     Objective:   Physical Exam        Assessment & Plan:

## 2012-03-15 NOTE — Progress Notes (Signed)
@[email or web address] @  Patient ID: Kim Delgado MRN: [id number], DOB: Jan 26, 1975, 37 y.o. Date of Encounter: 03/15/2012, 1:14 PM  Primary Physician: Abbe Amsterdam, MD  Chief Complaint: Physical (CPE)  HPI: 37 y.o. y/o female with history of noted below here for CPE.  Doing well. No issues/complaints.  LMP:  Pap: MMG: Review of Systems:  Consitutional: No fever, chills, fatigue, night sweats, lymphadenopathy, or weight gain. Eyes: No visual changes, eye redness, or discharge. ENT/Mouth: Ears: No otalgia, tinnitus, hearing loss, discharge. Nose: No congestion, rhinorrhea, sinus pain, or epistaxis. Throat: No sore throat, post nasal drip, or teeth pain. Cardiovascular: No CP, palpitations, diaphoresis, DOE, edema, orthopnea, PND. Respiratory: She has a history of exercise-induced asthma she has Dulera to take but rarely takes it. As a pro-air inhaler to use as  Gastrointestinal: No anorexia, dysphagia, reflux, pain, nausea, vomiting, hematemesis, diarrhea, constipation, BRBPR, or melena, history of internal hemmoroids, previous colonoscopy 11/2 year ago.  Breast: No discharge, pain, swelling, or mass. Genitourinary: No dysuria, frequency, urgency, hematuria, incontinence, nocturia, amenorrhea, vaginal discharge, pruritis, burning, abnormal bleeding, or pain. Musculoskeletal: No decreased ROM, myalgias, stiffness, joint swelling, or weakness. Skin: No rash, erythema, lesion changes, pain, warmth, jaundice, or pruritis. Neurological: No headache, dizziness, syncope, seizures, tremors, memory loss, coordination problems, or paresthesias. Psychological: No anxiety, depression, hallucinations, SI/HI. Endocrine: No fatigue, polydipsia, polyphagia, polyuria, or known diabetes. All other systems were reviewed and are otherwise negative.  Past Medical History  Diagnosis Date  . Papilloma of breast   . Rectal bleeding   . Anemia   . Obesity      Past Surgical History  Procedure Laterality Date  .  Tubal ligation    . Cystoscopy    . Abdominal hysterectomy      BSO  . Breast surgery      Home Meds:  Prior to Admission medications   Medication Sig Start Date End Date Taking? Authorizing Provider  Calcium Carbonate-Vitamin D (CALCIUM PLUS VITAMIN D PO) Take 500 mg by mouth 3 (three) times a week.     Yes Historical Provider, MD  Ascorbic Acid (VITAMIN C PO) Take by mouth. Will take three times a week     Historical Provider, MD  cyclobenzaprine (FLEXERIL) 10 MG tablet Take 1 tablet (10 mg total) by mouth 3 (three) times daily as needed for muscle spasms. 10/16/11   Phillips Odor, MD  Multiple Vitamins-Iron (MULTIVITAMIN/IRON PO) Take 1 tablet by mouth. Will take three times a week     Historical Provider, MD  naproxen sodium (ANAPROX DS) 550 MG tablet Take 1 tablet (550 mg total) by mouth 2 (two) times daily with a meal. 10/16/11 10/15/12  Phillips Odor, MD  ondansetron (ZOFRAN-ODT) 4 MG disintegrating tablet Dissolve one tablet under the tongue three times daily as needed for nausea 10/21/11   Raymon Mutton Dunn, PA-C  temazepam (RESTORIL) 30 MG capsule Take 1 capsule (30 mg total) by mouth at bedtime as needed for sleep. 10/16/11   Phillips Odor, MD  terbinafine (LAMISIL) 250 MG tablet Take 1 tablet (250 mg total) by mouth daily. 10/16/11   Phillips Odor, MD    Allergies: No Known Allergies  History   Social History  . Marital Status: Married    Spouse Name: N/A    Number of Children: 4  . Years of Education: N/A   Occupational History  . LPN    Social History Main Topics  . Smoking status: Current Every Day Smoker -- 0.30 packs/day    Types: Cigarettes  .  Smokeless tobacco: Never Used     Comment: Counseling sheet given to quit smoking   . Alcohol Use: No  . Drug Use: No  . Sexually Active: Yes    Birth Control/ Protection: Surgical   Other Topics Concern  . Not on file   Social History Narrative   Caffeine daily     Family History  Problem Relation Age  of Onset  . Transient ischemic attack Mother   . Breast cancer Maternal Aunt   . Throat cancer Maternal Aunt   . Lung cancer Paternal Aunt   . Colon cancer Neg Hx     Physical Exam  Blood pressure 116/82, pulse 84, temperature 97.9 F (36.6 C), temperature source Oral, resp. rate 18, height 5' 7.5" (1.715 m), weight 291 lb (131.997 kg), SpO2 99.00%., Body mass index is 44.88 kg/(m^2). General: Well developed, well nourished, in no acute distress. HEENT: Normocephalic, atraumatic. Conjunctiva pink, sclera non-icteric. Pupils 2 mm constricting to 1 mm, round, regular, and equally reactive to light and accomodation. EOMI. Internal auditory canal clear. TMs with good cone of light and without pathology. Nasal mucosa pink. Nares are without discharge. No sinus tenderness. Oral mucosa pink. Dentition normal. Pharynx without exudate.   Neck: Supple. Trachea midline. No thyromegaly. Full ROM. No lymphadenopathy. Lungs: Clear to auscultation bilaterally without wheezes, rales, or rhonchi. Breathing is of normal effort and unlabored. Cardiovascular: RRR with S1 S2. No murmurs, rubs, or gallops appreciated. Distal pulses 2+ symmetrically. No carotid or abdominal bruits Breast: There no masses felt on breast exam  Abdomen: Soft, non-tender, non-distended with normoactive bowel sounds. No hepatosplenomegaly or masses. No rebound/guarding. No CVA tenderness. Without hernias.  Genitourinary:  No exam performed. Patient is status post hysterectomy.   Musculoskeletal: Full range of motion and 5/5 strength throughout. Without swelling, atrophy, tenderness, crepitus, or warmth. Extremities without clubbing, cyanosis, or edema. Calves supple. Skin: Warm and moist without erythema, ecchymosis, wounds, or rash. Neuro: A+Ox3. CN II-XII grossly intact. Moves all extremities spontaneously. Full sensation throughout. Normal gait. DTR 2+ throughout upper and lower extremities. Finger to nose intact. Psych:  Responds  to questions appropriately with a normal affect.    Results for orders placed in visit on 03/15/12  POCT CBC      Result Value Range   WBC 7.7  4.6 - 10.2 K/uL   Lymph, poc 2.8  0.6 - 3.4   POC LYMPH PERCENT 35.8  10 - 50 %L   MID (cbc) 0.5  0 - 0.9   POC MID % 5.9  0 - 12 %M   POC Granulocyte 4.5  2 - 6.9   Granulocyte percent 58.3  37 - 80 %G   RBC 4.77  4.04 - 5.48 M/uL   Hemoglobin 12.9  12.2 - 16.2 g/dL   HCT, POC 16.1  09.6 - 47.9 %   MCV 88.0  80 - 97 fL   MCH, POC 27.0  27 - 31.2 pg   MCHC 30.7 (*) 31.8 - 35.4 g/dL   RDW, POC 04.5     Platelet Count, POC 365  142 - 424 K/uL   MPV 8.6  0 - 99.8 fL  POCT GLYCOSYLATED HEMOGLOBIN (HGB A1C)      Result Value Range   Hemoglobin A1C 7.1        Assessment/Plan:  37 y.o. y/o female here for CPE. She is completing a wellness exam so she can get a monetary break on her perineum to routine labs were done today  2  -  Signed, Earl Lites, MD 03/15/2012 1:14 PM

## 2012-03-16 DIAGNOSIS — E119 Type 2 diabetes mellitus without complications: Secondary | ICD-10-CM | POA: Insufficient documentation

## 2012-03-17 LAB — NICOTINE/COTININE METABOLITES: Cotinine: <10 ng/mL

## 2012-03-18 ENCOUNTER — Encounter: Payer: Self-pay | Admitting: *Deleted

## 2012-04-17 ENCOUNTER — Encounter: Payer: Self-pay | Admitting: Family Medicine

## 2012-04-17 ENCOUNTER — Ambulatory Visit (INDEPENDENT_AMBULATORY_CARE_PROVIDER_SITE_OTHER): Payer: 59 | Admitting: Family Medicine

## 2012-04-17 VITALS — BP 118/76 | HR 82 | Temp 98.4°F | Resp 16 | Ht 67.75 in | Wt 282.4 lb

## 2012-04-17 DIAGNOSIS — M62838 Other muscle spasm: Secondary | ICD-10-CM

## 2012-04-17 DIAGNOSIS — E119 Type 2 diabetes mellitus without complications: Secondary | ICD-10-CM

## 2012-04-17 DIAGNOSIS — Z23 Encounter for immunization: Secondary | ICD-10-CM

## 2012-04-17 DIAGNOSIS — M542 Cervicalgia: Secondary | ICD-10-CM

## 2012-04-17 MED ORDER — CYCLOBENZAPRINE HCL 5 MG PO TABS: ORAL_TABLET | ORAL | Status: DC

## 2012-04-17 NOTE — Patient Instructions (Addendum)
Heat, stretches, and range of motion to neck as discussed. See the neck care manual. Ibuprofen or alleve as needed only if soreness during the day. If not improved in next 10-14 days - recheck for possible repeat xrays. Return to the clinic or go to the nearest emergency room if any of your symptoms worsen or new symptoms occur.  We will refer you to diabetes and nutrition mgt center. Work on diet changes and increased activity as discussed. Check your blood sugars each day and bring record to office visit in next 8 weeks. If remaining above 200 - recheck sooner.
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
## 2012-04-20 ENCOUNTER — Telehealth: Payer: Self-pay

## 2012-04-20 NOTE — Telephone Encounter (Signed)
Pt came in on Monday to see Dr. Neva Seat. Pt is having back issues and is needing some pain medication. Call back number is [phone]  Pharmacy is [facility] [address]

## 2012-04-21 NOTE — Telephone Encounter (Signed)
Is she taking flexeril? - is she just out of this? - can refill flexeril once if needed, OR few days of Lortab 5/325 (1 every 6 hours - # 10) if needed, but should follow up next few days if not improving. Do not combine flexeril and hydrocodone. Check to see if she just needs refill of flexeril first. Still should follow up next few days if not improving.

## 2012-04-24 NOTE — Telephone Encounter (Signed)
Left message for her to call back

## 2012-04-26 NOTE — Telephone Encounter (Signed)
Called again. Left another message for her to call me back.

## 2012-04-27 ENCOUNTER — Telehealth: Payer: Self-pay | Admitting: Radiology

## 2012-04-27 NOTE — Telephone Encounter (Signed)
Patient not returning my calls  

## 2012-04-27 NOTE — Telephone Encounter (Signed)
Patient finally called back, and states she is better she has used biofreeze with good results. She states she has physical form and needs this filled out. Do you have the form?

## 2012-04-28 ENCOUNTER — Encounter: Payer: 59 | Attending: Family Medicine | Admitting: Dietician

## 2012-04-28 VITALS — Ht 68.0 in | Wt 282.6 lb

## 2012-04-28 DIAGNOSIS — Z713 Dietary counseling and surveillance: Secondary | ICD-10-CM | POA: Insufficient documentation

## 2012-04-28 DIAGNOSIS — E119 Type 2 diabetes mellitus without complications: Secondary | ICD-10-CM

## 2012-04-30 ENCOUNTER — Telehealth: Payer: Self-pay

## 2012-04-30 ENCOUNTER — Encounter: Payer: Self-pay | Admitting: Dietician

## 2012-04-30 NOTE — Telephone Encounter (Signed)
Patient gave Dr Neva Seat some forms (one wellness and the other for school) when she saw him 2 weeks ago. He told her he would pass it along to Dr Cleta Alberts to complete since he did the physical in March. It's been 2 weeks and she has not heard anything. She has called 2 times to see where the forms are and nobody has called back so she is very upset. She would like Dr. Neva Seat to call her back and let her know what is going on.  (I checked Dr. Ellis Parents box and the pickup drawer with no success.)  Best [phone]

## 2012-04-30 NOTE — Progress Notes (Signed)
  Patient was seen on 04/28/2012 for the first of a series of three diabetes self-management courses at [facility]. The following learning objectives were met by the patient during this course:  HT:68 in  WT: 282.6 lb  BMI: 43.1 kg/m2  A1C: 7.1% (03/15/2012)  Defines the role of glucose and insulin  Identifies type of diabetes and pathophysiology  Defines the diagnostic criteria for diabetes and prediabetes  States the risk factors for Type 2 Diabetes  States the symptoms of Type 2 Diabetes  Defines Type 2 Diabetes treatment goals  Defines Type 2 Diabetes treatment options  States the rationale for glucose monitoring  Identifies A1C, glucose targets, and testing times  Identifies proper sharps disposal  Defines the purpose of a diabetes food plan  Identifies carbohydrate food groups  Defines effects of carbohydrate foods on glucose levels  Identifies carbohydrate choices/grams/food labels  States benefits of physical activity and effect on glucose  Review of suggested activity guidelines  Handouts given during class include:  Type 2 Diabetes: Basics Book  My Food Plan Book  Food and Activity Log  Patient has established the following initial goals:  Increase exercise.  Follow a diabetes meal plan.  Lose weight.  Work on stress levels.   Follow-Up Plan: Attend the Diabetes Core Classes if her insurance company will pay for the classes

## 2012-05-01 NOTE — Telephone Encounter (Signed)
I saw her for acute visit, and because Dr. Cleta Alberts had done her physical - he was given her paperwork.  I do not have her forms, and were left for him after the 4/14 visit. I think he already completed them, but have not seen them.  I am having them look in other possible places for her ppwk.

## 2012-05-01 NOTE — Telephone Encounter (Signed)
I do not have the forms, do you?

## 2012-05-02 NOTE — Telephone Encounter (Signed)
Please check for the notes and see if they can be found. If unable to find ask the patient to bring in another set and I will complete them.

## 2012-05-02 NOTE — Telephone Encounter (Signed)
Called her, unable to locate forms. Left message for her to bring in another copy and deliver to me.

## 2012-05-04 ENCOUNTER — Telehealth: Payer: Self-pay

## 2012-05-04 NOTE — Telephone Encounter (Signed)
Please see previous phone message, I found the forms. They were at nurse's station. Called patient to notify. Faxed one form and she will pick the other one up.

## 2012-05-10 ENCOUNTER — Telehealth: Payer: Self-pay

## 2012-05-10 NOTE — Telephone Encounter (Signed)
Patients waist circumference can be measured, she should come in with the form, called her to advise, unfortunately her mail box is full. I am not able to leave a message.

## 2012-05-10 NOTE — Telephone Encounter (Signed)
Patient left voice mail concerning paperwork for CPE. States that we did not fill out the circumference on her paperwork but put "not done" instead. Her employer will not accept this, it is considered an incomplete form.

## 2012-05-12 NOTE — Telephone Encounter (Signed)
Called again. Mail box is full still

## 2012-05-17 NOTE — Telephone Encounter (Signed)
Patient will come in tomorrow for waist measurement only, she will bring her form and ask for me.

## 2012-06-23 ENCOUNTER — Ambulatory Visit: Payer: 59 | Admitting: Physician Assistant

## 2012-06-23 VITALS — BP 131/88 | HR 75 | Temp 98.4°F | Resp 16 | Ht 68.25 in | Wt 285.0 lb

## 2012-06-23 DIAGNOSIS — K648 Other hemorrhoids: Secondary | ICD-10-CM

## 2012-06-23 DIAGNOSIS — K649 Unspecified hemorrhoids: Secondary | ICD-10-CM | POA: Insufficient documentation

## 2012-06-23 DIAGNOSIS — E119 Type 2 diabetes mellitus without complications: Secondary | ICD-10-CM

## 2012-06-23 DIAGNOSIS — Z23 Encounter for immunization: Secondary | ICD-10-CM

## 2012-06-23 MED ORDER — METFORMIN HCL 500 MG PO TABS: 500.0000 mg | ORAL_TABLET | Freq: Two times a day (BID) | ORAL | Status: DC

## 2012-06-23 MED ORDER — HYDROCORTISONE ACETATE 25 MG RE SUPP: 25.0000 mg | Freq: Two times a day (BID) | RECTAL | Status: DC

## 2012-06-23 NOTE — Progress Notes (Signed)
  Subjective:    Patient ID: Kim Delgado, female    DOB: 11-06-1975, 37 y.o.   MRN: [id number]  HPI This 37 y.o. female presents for "A prescription of metformin and waist circumference measurement."  Diagnosed with DM type 2 in 03/2012 at a wellness exam.  Was seen again in 04/2012 for follow-up and advised that if her glucose began running above 200 regularly, to start metformin.  She's noticed some higher readings, and wants to start metformin now. A1C 03/2012 was 7.1%.  LDL was 76, but HDL was low.  She doesn't like to wear socks, but checks her feet daily.  Working on [employer], but hasn't started exercising. Has not had a pneumococcal vaccine.   Also, requests a refill of hemorrhoid treatment-internal hemorrhoids noted on colonoscopy.   Review of Systems Denies chest pain, shortness of breath, HA, dizziness, vision change, nausea, vomiting, diarrhea, constipation, melena, hematochezia, dysuria, increased urinary urgency or frequency, increased hunger or thirst, unintentional weight change, unexplained myalgias or arthralgias, rash.     Objective:   Physical Exam Blood pressure 131/88, pulse 75, temperature 98.4 F (36.9 C), temperature source Oral, resp. rate 16, height 5' 8.25" (1.734 m), weight 285 lb (129.275 kg), SpO2 98.00%. Body mass index is 42.99 kg/(m^2). Well-developed, well nourished BF who is awake, alert and oriented, in NAD. HEENT: [place]/AT, sclera and conjunctiva are clear.   Neck: supple, non-tender, no lymphadenopathy, thyromegaly. Heart: RRR, no murmur Lungs: normal effort, CTA Extremities: no cyanosis, clubbing or edema. Skin: warm and dry without rash. Psychologic: good mood and appropriate affect, normal speech and behavior.  See DM foot exam.  Results for orders placed in visit on 06/23/12  GLUCOSE, POCT (MANUAL RESULT ENTRY)      Result Value Range   POC Glucose 115 (*) 70 - 99 mg/dl  POCT GLYCOSYLATED HEMOGLOBIN (HGB A1C)      Result Value Range   Hemoglobin A1C 6.4         Assessment & Plan:  Type II or unspecified type diabetes mellitus without mention of complication, not stated as uncontrolled - Plan: POCT glucose (manual entry), POCT glycosylated hemoglobin (Hb A1C), Comprehensive metabolic panel, metFORMIN (GLUCOPHAGE) 500 MG tablet  Internal hemorrhoids - Plan: hydrocortisone (ANUSOL-HC) 25 MG suppository  Need for pneumococcal vaccine - Plan: Pneumococcal polysaccharide vaccine 23-valent greater than or equal to 2yo subcutaneous/IM  Fernande Bras, PA-C Physician Assistant-Certified [facility]

## 2012-06-24 LAB — COMPREHENSIVE METABOLIC PANEL
ALT: 17 U/L (ref 0–35)
BUN: 11 mg/dL (ref 6–23)
CO2: 27 mEq/L (ref 19–32)
Calcium: 9.8 mg/dL (ref 8.4–10.5)
Chloride: 103 mEq/L (ref 96–112)
Creat: 0.79 mg/dL (ref 0.50–1.10)

## 2012-06-25 ENCOUNTER — Encounter: Payer: Self-pay | Admitting: Physician Assistant

## 2012-07-17 ENCOUNTER — Ambulatory Visit (INDEPENDENT_AMBULATORY_CARE_PROVIDER_SITE_OTHER): Payer: 59 | Admitting: Physician Assistant

## 2012-07-17 VITALS — BP 112/78 | HR 76 | Temp 97.8°F | Resp 18 | Ht 68.0 in | Wt 284.0 lb

## 2012-07-17 DIAGNOSIS — Z7189 Other specified counseling: Secondary | ICD-10-CM

## 2012-07-17 DIAGNOSIS — Z7185 Encounter for immunization safety counseling: Secondary | ICD-10-CM

## 2012-07-17 NOTE — Progress Notes (Signed)
  Subjective:    Patient ID: Kim Delgado, female    DOB: [birth year], 37 y.o.   MRN: [id number]  HPI 37 year old female presents for immunization counseling.  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  Does admit that she has had the Hepatitis B series in the past as well as she had chicken pox as a child.  She is up to date on all other immunizations. Patient is otherwise healthy with no other concerns today.     Review of Systems  Constitutional: Negative for fever and chills.  Respiratory: Negative for cough.   Gastrointestinal: Negative for nausea and vomiting.  Neurological: Negative for headaches.       Objective:   Physical Exam  Constitutional: She is oriented to person, place, and time. She appears well-developed and well-nourished.  HENT:  Head: Normocephalic and atraumatic.  Right Ear: External ear normal.  Left Ear: External ear normal.  Eyes: Conjunctivae are normal.  Neck: Normal range of motion.  Cardiovascular: Normal rate.   Pulmonary/Chest: Effort normal.  Neurological: She is alert and oriented to person, place, and time.  Psychiatric: She has a normal mood and affect. Her behavior is normal. Judgment and thought content normal.          Assessment & Plan:  Immunization counseling - Plan: Hepatitis B surface antibody, Varicella zoster antibody, IgG  Titers drawn today.  Will print for her records once complete Follow up as needed.

## 2012-07-19 LAB — VARICELLA ZOSTER ANTIBODY, IGG: Varicella IgG: 794.5 Index — ABNORMAL HIGH (ref <135.00)

## 2012-07-20 ENCOUNTER — Ambulatory Visit: Payer: 59 | Admitting: Physician Assistant

## 2012-07-20 ENCOUNTER — Other Ambulatory Visit: Payer: Self-pay | Admitting: Physician Assistant

## 2012-07-20 VITALS — BP 118/66 | HR 88 | Temp 97.9°F | Resp 16 | Ht 68.75 in | Wt 282.2 lb

## 2012-07-20 DIAGNOSIS — Z23 Encounter for immunization: Secondary | ICD-10-CM

## 2012-07-20 LAB — HEPATITIS B SURFACE ANTIBODY, QUANTITATIVE: Hep B S AB Quant (Post): 1.1 m[IU]/mL

## 2012-07-20 NOTE — Patient Instructions (Signed)
Return in about 1 month and then in 6 months to complete the hepatitis b series.   Immunization Schedule, Adult  Influenza vaccine.  Adults should be given 1 dose every year.  Tetanus, diphtheria, and pertussis (Td, Tdap) vaccine.  Adults who have not previously been given Tdap or who do not know their vaccine status should be given 1 dose of Tdap.  Adults should have a Td booster every 10 years.  Doses should be given if needed to catch up on missed doses in the past.  Pregnant women should be given 1 dose of Tdap vaccine during each pregnancy.  Varicella vaccine.  All adults without evidence of immunity to varicella should receive 2 doses or a second dose if they have received only 1 dose.  Pregnant women who do not have evidence of immunity should be given the first dose after their pregnancy.  Human papillomavirus (HPV) vaccine.  Women aged 22 through 26 years who have not been given the vaccine previously should be given the 3 dose series. The second dose should be given 1 to 2 months after the first dose. The third dose should be given at least 24 weeks after the first dose.  The vaccine is not recommended for use in pregnant women. However, pregnancy testing is not needed before being given a dose. If a woman is found to be pregnant after being given a dose, no treatment is needed. In that case, the remaining doses should be delayed until after the pregnancy.  Men aged 53 through 21 years who have not been given the vaccine previously should be given the 3 dose series. Men aged 71 through 58 years may be given the 3 dose series. The second dose should be given 1 to 2 months after the first dose. The third dose should be given at least 24 weeks after the first dose.  Zoster vaccine.  One dose is recommended for adults aged 4 years and older unless certain conditions are present.  Measles, mumps, and rubella (MMR) vaccine.  Adults born before 10 generally are considered  immune to measles and mumps. Healthcare workers born before 1957 who do not have evidence of immunity should consider vaccination.  Adults born in [birth year] or later should have 1 or more doses of MMR vaccine unless there is a contraindication for the vaccine or they have evidence of immunity to the diseases. A second dose should be given at least 28 days after the first dose. Adults receiving certain types of previous vaccines should consider or be given vaccine doses.  For women of childbearing age, rubella immunity should be determined. If there is no evidence of immunity, women who are not pregnant should be vaccinated. If there is no evidence of immunity, women who are pregnant should delay vaccination until after their pregnancy.  Pneumococcal polysaccharide (PPSV23) vaccine.  All adults aged 75 years and older should be given 1 dose.  Adults younger than age 7 years who have certain medical conditions, who smoke cigarettes, who reside in nursing homes or long-term care facilities, or who have an unknown vaccination history should usually be given 1 or 2 doses of the vaccine.  Pneumococcal 13-valent conjugate (PVC13) vaccine.  Adults aged 40 years or older with certain medical conditions and an unknown or incomplete pneumococcal vaccination history should usually be given 1 dose of the vaccine. This dose may be in addition to a PPSV23 vaccine dose.  Meningococcal vaccine.  First-year college students up to age 63 years who  are living in residence halls should be given a dose if they did not receive a dose on or after their 16th birthday.  A dose should be given to microbiologists working with certain meningitis bacteria, military recruits, and people who travel to or live in countries with a high rate of meningitis.  One or 2 doses should be given to adults who have certain high-risk conditions.  Hepatitis A vaccine.  Adults who wish to be protected from this disease, who have certain  high-risk conditions, who work with hepatitis A-infected animals, who work in hepatitis A research labs, or who travel to or work in countries with a high rate of hepatitis A should be given the 2 dose series of the vaccine.  Adults who were previously unvaccinated and who anticipate close contact with an international adoptee during the first 60 days after arrival in the [place] from a country with a high rate of hepatitis A should be given the vaccine. The first dose of the 2 dose series should be given 2 or more weeks before the arrival of the adoptee.  Hepatitis B vaccine.  Adults who wish to be protected from this disease, who have certain high-risk conditions, who may be exposed to blood or other infectious body fluids, who are household contacts or sex partners of hepatitis B positive people, who are clients or workers in certain care facilities, or who travel to or work in countries with a high rate of hepatitis B should be given the 3 dose series of the vaccine. If you travel outside the [place], additional vaccines may be needed. The Centers for Disease Control and Prevention (CDC) provides information about the vaccines, medicines, and other measures necessary to prevent illness and injury during international travel. Visit the CDC website at http://www.church.org/ or call (800) CDC-INFO [(207) 465-2683]. You may also consult a travel clinic or your caregiver. Document Released: 03/13/2003 Document Revised: 03/15/2011 Document Reviewed: 02/05/2011 [facility] Patient Information 2014 [place], [place].

## 2012-07-20 NOTE — Progress Notes (Signed)
  Subjective:    Patient ID: Kim Delgado, female    DOB: [birth year], 37 y.o.   MRN: [id number]  HPI   Kim Delgado is a very pleasant 37 yr old female here for hep b and mmr vaccines.  Titers from 07/17/12 were negative and pt requires vaccinations for school.  She feels well today.     Review of Systems  All other systems reviewed and are negative.       Objective:   Physical Exam  Vitals reviewed. Constitutional: She is oriented to person, place, and time. She appears well-developed and well-nourished. No distress.  HENT:  Head: Normocephalic and atraumatic.  Eyes: Conjunctivae are normal. No scleral icterus.  Pulmonary/Chest: Effort normal.  Neurological: She is alert and oriented to person, place, and time.  Skin: Skin is warm and dry.  Psychiatric: She has a normal mood and affect. Her behavior is normal.         Assessment & Plan:  Need for hepatitis B vaccination - Plan: Hepatitis B vaccine adult IM  Need for MMR vaccine - Plan: MMR vaccine subcutaneous   Kim Delgado is a very pleasant 37 yr old female here for immunizations.  First hep b and MMR given today.  Provided documentation for her to take to school.  Pt to return in 1 month then 6 months to complete help b series.  Discussed the possibility of re-drawing titers after completion of vaccine series.

## 2012-08-19 ENCOUNTER — Ambulatory Visit (INDEPENDENT_AMBULATORY_CARE_PROVIDER_SITE_OTHER): Payer: 59

## 2012-08-19 VITALS — BP 128/72 | Temp 98.7°F

## 2012-08-19 DIAGNOSIS — Z23 Encounter for immunization: Secondary | ICD-10-CM

## 2012-08-19 NOTE — Addendum Note (Signed)
Addended byAlden Benjamin R on: 08/19/2012 08:36 AM   Modules accepted: Level of Service

## 2013-03-17 ENCOUNTER — Ambulatory Visit (INDEPENDENT_AMBULATORY_CARE_PROVIDER_SITE_OTHER): Payer: 59 | Admitting: Family Medicine

## 2013-03-17 VITALS — BP 126/74 | HR 86 | Temp 98.5°F | Resp 16 | Ht 67.0 in | Wt 289.0 lb

## 2013-03-17 DIAGNOSIS — R0609 Other forms of dyspnea: Secondary | ICD-10-CM

## 2013-03-17 DIAGNOSIS — R0989 Other specified symptoms and signs involving the circulatory and respiratory systems: Secondary | ICD-10-CM

## 2013-03-17 DIAGNOSIS — R5381 Other malaise: Secondary | ICD-10-CM

## 2013-03-17 DIAGNOSIS — E119 Type 2 diabetes mellitus without complications: Secondary | ICD-10-CM

## 2013-03-17 DIAGNOSIS — R0683 Snoring: Secondary | ICD-10-CM

## 2013-03-17 DIAGNOSIS — R5383 Other fatigue: Secondary | ICD-10-CM

## 2013-03-17 DIAGNOSIS — G4700 Insomnia, unspecified: Secondary | ICD-10-CM

## 2013-03-17 DIAGNOSIS — Z23 Encounter for immunization: Secondary | ICD-10-CM

## 2013-03-17 DIAGNOSIS — L603 Nail dystrophy: Secondary | ICD-10-CM

## 2013-03-17 DIAGNOSIS — L608 Other nail disorders: Secondary | ICD-10-CM

## 2013-03-17 DIAGNOSIS — Z6841 Body Mass Index (BMI) 40.0 and over, adult: Secondary | ICD-10-CM

## 2013-03-17 DIAGNOSIS — M62838 Other muscle spasm: Secondary | ICD-10-CM

## 2013-03-17 LAB — COMPREHENSIVE METABOLIC PANEL
ALT: 23 U/L (ref 0–35)
AST: 17 U/L (ref 0–37)
Albumin: 4.2 g/dL (ref 3.5–5.2)
Alkaline Phosphatase: 86 U/L (ref 39–117)
BUN: 9 mg/dL (ref 6–23)
CALCIUM: 9.1 mg/dL (ref 8.4–10.5)
CHLORIDE: 102 meq/L (ref 96–112)
CO2: 29 meq/L (ref 19–32)
CREATININE: 0.72 mg/dL (ref 0.50–1.10)
GLUCOSE: 132 mg/dL — AB (ref 70–99)
Potassium: 3.9 mEq/L (ref 3.5–5.3)
Sodium: 138 mEq/L (ref 135–145)
Total Bilirubin: 0.4 mg/dL (ref 0.2–1.2)
Total Protein: 7.4 g/dL (ref 6.0–8.3)

## 2013-03-17 LAB — VITAMIN B12: Vitamin B-12: 910 pg/mL (ref 211–911)

## 2013-03-17 LAB — POCT URINALYSIS DIPSTICK
Bilirubin, UA: NEGATIVE
Glucose, UA: NEGATIVE
Ketones, UA: NEGATIVE
Nitrite, UA: NEGATIVE
PROTEIN UA: NEGATIVE
Urobilinogen, UA: 0.2
pH, UA: 6

## 2013-03-17 LAB — POCT UA - MICROSCOPIC ONLY
BACTERIA, U MICROSCOPIC: NEGATIVE
CASTS, UR, LPF, POC: NEGATIVE
CRYSTALS, UR, HPF, POC: NEGATIVE
MUCUS UA: NEGATIVE
Yeast, UA: NEGATIVE

## 2013-03-17 LAB — POCT CBC
Granulocyte percent: 56.4 %G (ref 37–80)
HEMATOCRIT: 40.7 % (ref 37.7–47.9)
HEMOGLOBIN: 12.9 g/dL (ref 12.2–16.2)
LYMPH, POC: 3.3 (ref 0.6–3.4)
MCH: 28 pg (ref 27–31.2)
MCHC: 31.7 g/dL — AB (ref 31.8–35.4)
MCV: 88.4 fL (ref 80–97)
MID (cbc): 0.5 (ref 0–0.9)
MPV: 9.3 fL (ref 0–99.8)
POC Granulocyte: 4.9 (ref 2–6.9)
POC LYMPH PERCENT: 37.4 %L (ref 10–50)
POC MID %: 6.2 %M (ref 0–12)
Platelet Count, POC: 378 10*3/uL (ref 142–424)
RBC: 4.6 M/uL (ref 4.04–5.48)
RDW, POC: 14.1 %
WBC: 8.7 10*3/uL (ref 4.6–10.2)

## 2013-03-17 LAB — LIPID PANEL
CHOLESTEROL: 126 mg/dL (ref 0–200)
HDL: 31 mg/dL — ABNORMAL LOW (ref >39)
LDL Cholesterol: 66 mg/dL (ref 0–99)
TRIGLYCERIDES: 146 mg/dL (ref <150)
Total CHOL/HDL Ratio: 4.1 Ratio
VLDL: 29 mg/dL (ref 0–40)

## 2013-03-17 LAB — MICROALBUMIN, URINE: Microalb, Ur: 0.5 mg/dL (ref 0.00–1.89)

## 2013-03-17 LAB — TSH: TSH: 5.513 u[IU]/mL — AB (ref 0.350–4.500)

## 2013-03-17 LAB — POCT GLYCOSYLATED HEMOGLOBIN (HGB A1C): Hemoglobin A1C: 6.5

## 2013-03-17 MED ORDER — CYCLOBENZAPRINE HCL 5 MG PO TABS: ORAL_TABLET | ORAL | Status: DC

## 2013-03-17 MED ORDER — TERBINAFINE HCL 250 MG PO TABS: 250.0000 mg | ORAL_TABLET | Freq: Every day | ORAL | Status: DC

## 2013-03-17 NOTE — Patient Instructions (Signed)
1. Return in six weeks for repeat liver function testing.

## 2013-03-17 NOTE — Progress Notes (Signed)
Subjective:    Patient ID: Kim Delgado, female    DOB: July 25, 1975, 38 y.o.   MRN: [id number]  Diabetes Pertinent negatives for hypoglycemia include no dizziness, headaches, seizures, speech difficulty or tremors. Associated symptoms include fatigue. Pertinent negatives for diabetes include no chest pain, no polydipsia, no polyphagia, no polyuria and no weakness.   Chief Complaint  Patient presents with  . Diabetes    check hemoglobin and thyroid levels  . Medication Refill    lamisil   This chart was scribed for Wardell Honour, MD by Thea Alken, ED Scribe. This patient was seen in room 14 and the patient's care was started at 12:19 PM.  HPI Comments: Kim Delgado is a 38 y.o. female who presents to the [facility] requesting medication refill of DM medication. Pt does not have a meter and does not check her blood sugar regularly . She reports that when she checks her sugar at work it reads about 151 at random. She denies tingling in her legs. Pt works third shift as a Corporate treasurer but when she is asleep she does use the restroom. Pt denies taking metformin consistently. She reports that it gives hers hypoglycemic episodes. Pt received flu shot this year at [facility]. Pt reports swelling in legs when standing too long. She denies wearing compression socks. Pt ate about 4 hours ago. Pt denies CP, SOB, and cough.  Due for hepatitis B#3.  Pt reports that she has h/o Hysterectomy due to stage 4 endometriosis. Pt states that her mother is alive and has HTN and has had 2 TIA.  She reports that her father died form AID's. Pt reports 2 sisters and 2 brothers that are healthy. Pt is a non smoker and will have quit 2 years ago from July. Pt does not drink.  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 Pt reports that she has had hard time sleeping and reports that flexeril which is the only medication that will allow her to sleep.   Pt takes Lamisil for her toe nail but has been  non-compliant with Lamisil; would like to restart; has thickening of toenail on L first digit of toe.     She reports that she is exhausted. Pt denies having sleep study and unsure if she stops breathing at night while sleeping.  +snoring; works third shift.  Becoming more fatigued.  Has problem with insomnia; wants blood work including TSH, vitamin D.   Pt is requesting to have her thyroid, hemaglobin A1c and DM checked.    Past Medical History  Diagnosis Date  . Papilloma of breast   . Rectal bleeding   . Anemia   . Obesity   . Allergy   . Diabetes mellitus without complication    No Known Allergies Prior to Admission medications   Medication Sig Start Date End Date Taking? Authorizing Provider  Ascorbic Acid (VITAMIN C PO) Take by mouth. Will take three times a week    Yes Historical Provider, MD  Calcium Carbonate-Vitamin D (CALCIUM PLUS VITAMIN D PO) Take 500 mg by mouth 3 (three) times a week.     Yes Historical Provider, MD  cyclobenzaprine (FLEXERIL) 5 MG tablet 1 pill by mouth up to every 8 hours as needed. Start with one pill by mouth each bedtime as needed due to sedation 04/17/12  Yes Wendie Agreste, MD  ferrous fumarate (HEMOCYTE - 106 MG FE) 325 (106 FE) MG TABS Take  1 tablet by mouth.   Yes Historical Provider, MD  hydrocortisone (ANUSOL-HC) 25 MG suppository Place 1 suppository (25 mg total) rectally 2 (two) times daily. 06/23/12  Yes Chelle S Jeffery, PA-C  metFORMIN (GLUCOPHAGE) 500 MG tablet Take 1 tablet (500 mg total) by mouth 2 (two) times daily with a meal. 06/23/12  Yes Chelle S Jeffery, PA-C  Multiple Vitamins-Iron (MULTIVITAMIN/IRON PO) Take 1 tablet by mouth. Will take three times a week    Yes Historical Provider, MD  temazepam (RESTORIL) 30 MG capsule Take 1 capsule (30 mg total) by mouth at bedtime as needed for sleep. 10/16/11  Yes Ellison Carwin, MD  terbinafine (LAMISIL) 250 MG tablet Take 1 tablet (250 mg total) by mouth daily. 10/16/11  Yes Ellison Carwin, MD   History   Social History  . Marital Status: Married    Spouse Name: N/A    Number of Children: 4  . Years of Education: N/A   Occupational History  . LPN     3rd shift at [employer] History Main Topics  . Smoking status: Former Smoker -- 0.30 packs/day    Types: Cigarettes  . Smokeless tobacco: Never Used     Comment: Counseling sheet given to quit smoking   . Alcohol Use: No  . Drug Use: No  . Sexual Activity: Yes    Birth Control/ Protection: Surgical   Other Topics Concern  . Not on file   Social History Narrative   Marital status: married       Children: 4 children.      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       Education: [employer].  Going back for RN.       Tobacco; quit 2013.       Alcohol: none       Exercise: none in 03/2013.   Family History  Problem Relation Age of Onset  . Transient ischemic attack Mother   . Hypertension Mother   . Breast cancer Maternal Aunt   . Throat cancer Maternal Aunt   . Lung cancer Paternal Aunt   . Colon cancer Neg Hx   . Glaucoma Maternal Grandmother   . Diabetes Maternal Grandmother   . Drug abuse Father     iv drug abuse     Review of Systems  Constitutional: Positive for fatigue. Negative for activity change and appetite change.  Respiratory: Negative for cough and shortness of breath.   Cardiovascular: Positive for leg swelling. Negative for chest pain and palpitations.  Endocrine: Negative for cold intolerance, heat intolerance, polydipsia, polyphagia and polyuria.  Genitourinary: Negative for dysuria, urgency and frequency.  Skin: Positive for color change. Negative for rash.  Neurological: Negative for dizziness, tremors, seizures, syncope, facial asymmetry, speech difficulty, weakness, light-headedness, numbness and headaches.  Psychiatric/Behavioral: Positive for sleep disturbance.      Objective:   Physical Exam  Nursing note and vitals reviewed. Constitutional: She is oriented to  person, place, and time. She appears well-developed and well-nourished. No distress.  HENT:  Head: Normocephalic and atraumatic.  Mouth/Throat: Oropharynx is clear and moist.  Eyes: Conjunctivae and EOM are normal. Pupils are equal, round, and reactive to light.  Neck: Neck supple. No tracheal deviation present. No thyromegaly present.  Cardiovascular: Normal rate, regular rhythm, normal heart sounds and intact distal pulses.  Exam reveals no gallop and no friction rub.   No murmur heard. Pulmonary/Chest: Effort normal and breath sounds normal. No respiratory distress. She has no  wheezes. She has no rales.  Musculoskeletal: Normal range of motion.  Trace edema bilaterally  Neurological: She is alert and oriented to person, place, and time.  Skin: Skin is warm and dry. She is not diaphoretic.  Psychiatric: She has a normal mood and affect. Her behavior is normal.   Results for orders placed in visit on 03/17/13  POCT CBC      Result Value Ref Range   WBC 8.7  4.6 - 10.2 K/uL   Lymph, poc 3.3  0.6 - 3.4   POC LYMPH PERCENT 37.4  10 - 50 %L   MID (cbc) 0.5  0 - 0.9   POC MID % 6.2  0 - 12 %M   POC Granulocyte 4.9  2 - 6.9   Granulocyte percent 56.4  37 - 80 %G   RBC 4.60  4.04 - 5.48 M/uL   Hemoglobin 12.9  12.2 - 16.2 g/dL   HCT, POC 40.7  37.7 - 47.9 %   MCV 88.4  80 - 97 fL   MCH, POC 28.0  27 - 31.2 pg   MCHC 31.7 (*) 31.8 - 35.4 g/dL   RDW, POC 14.1     Platelet Count, POC 378  142 - 424 K/uL   MPV 9.3  0 - 99.8 fL  POCT GLYCOSYLATED HEMOGLOBIN (HGB A1C)      Result Value Ref Range   Hemoglobin A1C 6.5    POCT UA - MICROSCOPIC ONLY      Result Value Ref Range   WBC, Ur, HPF, POC 0-2     RBC, urine, microscopic 0-1     Bacteria, U Microscopic neg     Mucus, UA neg     Epithelial cells, urine per micros 0-5     Crystals, Ur, HPF, POC neg     Casts, Ur, LPF, POC neg     Yeast, UA neg    POCT URINALYSIS DIPSTICK      Result Value Ref Range   Color, UA yellow      Clarity, UA clear     Glucose, UA neg     Bilirubin, UA neg     Ketones, UA neg     Spec Grav, UA <=1.005     Blood, UA tr-lysed     pH, UA 6.0     Protein, UA neg     Urobilinogen, UA 0.2     Nitrite, UA neg     Leukocytes, UA Trace     HEPATITIS B#3 ADMINISTERED BY FABIOLA.    Assessment & Plan:  Diabetes - Plan: Hepatitis B vaccine adult IM, POCT CBC, POCT glycosylated hemoglobin (Hb A1C), POCT UA - Microscopic Only, POCT urinalysis dipstick, Microalbumin, urine, Comprehensive metabolic panel, TSH, Lipid panel  Fatigue - Plan: Hepatitis B vaccine adult IM, POCT CBC, POCT glycosylated hemoglobin (Hb A1C), POCT UA - Microscopic Only, POCT urinalysis dipstick, Microalbumin, urine, Comprehensive metabolic panel, TSH, Lipid panel, Vitamin B12, Vit D  25 hydroxy (rtn osteoporosis monitoring)  Snoring  BMI 40.0-44.9, adult  Dystrophic nail  Insomnia  Need for hepatitis B vaccination  1.  DMII: controlled; obtain labs; continue Metformin PRN; s/p Hepatitis B#2.  Monofilament intact. 2.  Fatigue:  New.  Obtain labs. If labs normal, refer for sleep study. 3.  Snoring:  New. Associated with fatigue, DMII.  Pt to consider sleep study. 4.  Obesity: worsening; pt to work aggressively on exercise, weight loss. 5.  Insomnia: persistent; refill of Flexeril provided.  Avoid caffeine  six hours before bedtime. 6.  S/ p Hepatitis B#3. 7.  Dystrophic nail:  New to this provider; obtain LFTs; rx for Lamisil provided. RTC six weeks for LFTs. Will warrant 12 weeks of therapy.  I personally performed the services described in this documentation, which was scribed in my presence.  The recorded information has been reviewed and is accurate.  Reginia Forts, M.D.  [facility] [address] [place], [place]  [address] [phone] phone [phone] fax

## 2013-03-19 LAB — VITAMIN D 25 HYDROXY (VIT D DEFICIENCY, FRACTURES): Vit D, 25-Hydroxy: 37 ng/mL (ref 30–89)

## 2013-05-28 ENCOUNTER — Ambulatory Visit: Payer: 59 | Admitting: Family Medicine

## 2013-05-28 VITALS — BP 110/80 | HR 74 | Temp 97.8°F | Resp 18 | Ht 68.0 in | Wt 291.0 lb

## 2013-05-28 DIAGNOSIS — R059 Cough, unspecified: Secondary | ICD-10-CM

## 2013-05-28 DIAGNOSIS — J040 Acute laryngitis: Secondary | ICD-10-CM

## 2013-05-28 DIAGNOSIS — J209 Acute bronchitis, unspecified: Secondary | ICD-10-CM

## 2013-05-28 DIAGNOSIS — R05 Cough: Secondary | ICD-10-CM

## 2013-05-28 MED ORDER — HYDROCODONE-HOMATROPINE 5-1.5 MG/5ML PO SYRP: 5.0000 mL | ORAL_SOLUTION | Freq: Three times a day (TID) | ORAL | Status: DC | PRN

## 2013-05-28 MED ORDER — AZITHROMYCIN 250 MG PO TABS: ORAL_TABLET | ORAL | Status: DC

## 2013-05-28 NOTE — Patient Instructions (Signed)

## 2013-05-28 NOTE — Progress Notes (Signed)
Subjective:    Patient ID: Kim Delgado, female    DOB: [birth year], 38 y.o.   MRN: [id number]  Otalgia  Associated symptoms include coughing and a sore throat.  Shortness of Breath Associated symptoms include ear pain and a sore throat. Pertinent negatives include no fever.  Cough Associated symptoms include ear pain, a sore throat and shortness of breath. Pertinent negatives include no fever.   Chief Complaint  Patient presents with   Otalgia    X Tuesday   Shortness of Breath    X Thursday   Cough    Productive, yellow phlegm   This chart was scribed for Robyn Haber, MD by Thea Alken, ED Scribe. This patient was seen in room 8 and the patient's care was started at 3:54 PM.  HPI Comments: Kim Delgado is a 38 y.o. female who presents to the [facility] complaining of cough onset 1 week. She reports she was out in the rain one week ago. The next day she reports she had otalgia, sore throat, SOB and a cough. Pt reports she is hoarse and has pain with talking. Pt has taken Theraflu and cough drops.  Pt is former smoker and quit about 2.5 years ago. Pt denies fever.  Pt reports her blood sugar has been off for the past 3 weeks. Pt reports she was taking metformin every other day but has recently changed to every day. Pt reports she has been stressed.  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  Past Medical History  Diagnosis Date   Papilloma of breast    Rectal bleeding    Anemia    Obesity    Allergy    Diabetes mellitus without complication    No Known Allergies Prior to Admission medications   Medication Sig Start Date End Date Taking? Authorizing Provider  Ascorbic Acid (VITAMIN C PO) Take by mouth. Will take three times a week    Yes Historical Provider, MD  Calcium Carbonate-Vitamin D (CALCIUM PLUS VITAMIN D PO) Take 500 mg by mouth 3 (three) times a week.     Yes Historical Provider, MD  cyclobenzaprine (FLEXERIL) 5 MG tablet 1 pill by mouth  up to every 8 hours as needed. Start with one pill by mouth each bedtime as needed due to sedation 03/17/13  Yes Wardell Honour, MD  ferrous fumarate (HEMOCYTE - 106 MG FE) 325 (106 FE) MG TABS Take 1 tablet by mouth.   Yes Historical Provider, MD  metFORMIN (GLUCOPHAGE) 500 MG tablet Take 1 tablet (500 mg total) by mouth 2 (two) times daily with a meal. 06/23/12  Yes Chelle S Jeffery, PA-C  Multiple Vitamins-Iron (MULTIVITAMIN/IRON PO) Take 1 tablet by mouth. Will take three times a week    Yes Historical Provider, MD  temazepam (RESTORIL) 30 MG capsule Take 1 capsule (30 mg total) by mouth at bedtime as needed for sleep. 10/16/11  Yes Ellison Carwin, MD  terbinafine (LAMISIL) 250 MG tablet Take 1 tablet (250 mg total) by mouth daily. 03/17/13  Yes Wardell Honour, MD  hydrocortisone (ANUSOL-HC) 25 MG suppository Place 1 suppository (25 mg total) rectally 2 (two) times daily. 06/23/12   Fara Chute, PA-C   Review of Systems  Constitutional: Positive for diaphoresis. Negative for fever.  HENT: Positive for ear pain, sore throat and voice change.   Respiratory: Positive for cough and shortness of breath.       Objective:   Physical Exam  Nursing note and vitals reviewed. Constitutional: She is oriented to person, place, and time. She appears well-developed and well-nourished. No distress.  HENT:  Head: Normocephalic and atraumatic.  Right Ear: External ear normal.  Left Ear: External ear normal.  Mouth/Throat: Oropharynx is clear and moist.  Eyes: EOM are normal.  Neck: Normal range of motion. Neck supple. No thyromegaly present.  Cardiovascular: Normal rate, regular rhythm and normal heart sounds.  Exam reveals no gallop and no friction rub.   No murmur heard. Pulmonary/Chest: Effort normal. She has rales ( base of the lung).  Musculoskeletal: Normal range of motion.  Lymphadenopathy:    She has no cervical adenopathy.  Neurological: She is alert and oriented to person, place, and  time.  Skin: Skin is warm and dry.  Psychiatric: She has a normal mood and affect. Her behavior is normal.      Assessment & Plan:   Cough - Plan: azithromycin (ZITHROMAX Z-PAK) 250 MG tablet, HYDROcodone-homatropine (HYCODAN) 5-1.5 MG/5ML syrup  Acute bronchitis - Plan: azithromycin (ZITHROMAX Z-PAK) 250 MG tablet, HYDROcodone-homatropine (HYCODAN) 5-1.5 MG/5ML syrup  Laryngitis - Plan: azithromycin (ZITHROMAX Z-PAK) 250 MG tablet, HYDROcodone-homatropine (HYCODAN) 5-1.5 MG/5ML syrup  Signed, Robyn Haber, MD

## 2013-07-23 ENCOUNTER — Ambulatory Visit (INDEPENDENT_AMBULATORY_CARE_PROVIDER_SITE_OTHER): Payer: 59 | Admitting: Emergency Medicine

## 2013-07-23 VITALS — BP 118/82 | HR 79 | Temp 98.8°F | Resp 16 | Ht 68.58 in | Wt 280.5 lb

## 2013-07-23 DIAGNOSIS — Z6841 Body Mass Index (BMI) 40.0 and over, adult: Secondary | ICD-10-CM

## 2013-07-23 DIAGNOSIS — E119 Type 2 diabetes mellitus without complications: Secondary | ICD-10-CM

## 2013-07-23 DIAGNOSIS — B351 Tinea unguium: Secondary | ICD-10-CM

## 2013-07-23 DIAGNOSIS — J209 Acute bronchitis, unspecified: Secondary | ICD-10-CM

## 2013-07-23 DIAGNOSIS — G4733 Obstructive sleep apnea (adult) (pediatric): Secondary | ICD-10-CM

## 2013-07-23 LAB — POCT GLYCOSYLATED HEMOGLOBIN (HGB A1C): Hemoglobin A1C: 9.1

## 2013-07-23 MED ORDER — IPRATROPIUM BROMIDE 0.02 % IN SOLN
0.5000 mg | Freq: Once | RESPIRATORY_TRACT | Status: AC
  Administered 2013-07-23: 0.5 mg via RESPIRATORY_TRACT

## 2013-07-23 MED ORDER — AZITHROMYCIN 250 MG PO TABS
ORAL_TABLET | ORAL | Status: DC
Start: 2013-07-23 — End: 2013-08-06

## 2013-07-23 MED ORDER — ALBUTEROL SULFATE (2.5 MG/3ML) 0.083% IN NEBU
5.0000 mg | INHALATION_SOLUTION | Freq: Once | RESPIRATORY_TRACT | Status: AC
  Administered 2013-07-23: 5 mg via RESPIRATORY_TRACT

## 2013-07-23 MED ORDER — TERBINAFINE HCL 250 MG PO TABS: 250.0000 mg | ORAL_TABLET | Freq: Every day | ORAL | Status: DC

## 2013-07-23 MED ORDER — ALBUTEROL SULFATE HFA 108 (90 BASE) MCG/ACT IN AERS: 2.0000 | INHALATION_SPRAY | RESPIRATORY_TRACT | Status: DC | PRN

## 2013-07-23 NOTE — Progress Notes (Signed)
[facility] [address] [place] [address] [phone]  Date:  07/23/2013   Name:  Kim Delgado   DOB:  February 24, 1975   MRN:  [id number]  PCP:  Lamar Blinks, MD    Chief Complaint: Annual Exam, Cough and Medication Refill   History of Present Illness:  Kim Delgado is a 38 y.o. very pleasant female patient who presents with the following:  Ill since Friday with cough and wheezing.  Cough is non productive.  No fever or chills.  No history of reactive airway disease.  No coryza. Non smoker (stopped four years ago)   No improvement with over the counter medications or other home remedies. Denies other complaint or health concern today.   Patient Active Problem List   Diagnosis Date Noted  . Internal hemorrhoids 06/23/2012  . Type II or unspecified type diabetes mellitus without mention of complication, not stated as uncontrolled 03/16/2012  . BMI 40.0-44.9, adult 10/19/2011  . Nicotine addiction 10/19/2011  . Rectal bleeding 12/23/2010    Past Medical History  Diagnosis Date  . Papilloma of breast   . Rectal bleeding   . Anemia   . Obesity   . Allergy   . Diabetes mellitus without complication     Past Surgical History  Procedure Laterality Date  . Tubal ligation    . Cystoscopy    . Breast surgery    . Abdominal hysterectomy      BSO; DUB; fibroids; endometriosis.    History  Substance Use Topics  . Smoking status: Former Smoker -- 0.30 packs/day    Types: Cigarettes  . Smokeless tobacco: Never Used     Comment: Counseling sheet given to quit smoking   . Alcohol Use: No    Family History  Problem Relation Age of Onset  . Transient ischemic attack Mother   . Hypertension Mother   . Breast cancer Maternal Aunt   . Throat cancer Maternal Aunt   . Lung cancer Paternal Aunt   . Colon cancer Neg Hx   . Glaucoma Maternal Grandmother   . Diabetes Maternal Grandmother   . Drug abuse Father     iv drug abuse    No Known  Allergies  Medication list has been reviewed and updated.  Current Outpatient Prescriptions on File Prior to Visit  Medication Sig Dispense Refill  . Ascorbic Acid (VITAMIN C PO) Take by mouth. Will take three times a week       . Calcium Carbonate-Vitamin D (CALCIUM PLUS VITAMIN D PO) Take 500 mg by mouth 3 (three) times a week.        . cyclobenzaprine (FLEXERIL) 5 MG tablet 1 pill by mouth up to every 8 hours as needed. Start with one pill by mouth each bedtime as needed due to sedation  30 tablet  5  . ferrous fumarate (HEMOCYTE - 106 MG FE) 325 (106 FE) MG TABS Take 1 tablet by mouth.      . hydrocortisone (ANUSOL-HC) 25 MG suppository Place 1 suppository (25 mg total) rectally 2 (two) times daily.  12 suppository  0  . Multiple Vitamins-Iron (MULTIVITAMIN/IRON PO) Take 1 tablet by mouth. Will take three times a week       . terbinafine (LAMISIL) 250 MG tablet Take 1 tablet (250 mg total) by mouth daily.  30 tablet  1   No current facility-administered medications on file prior to visit.    Review of Systems:  As per HPI, otherwise  negative.    Physical Examination: Filed Vitals:   07/23/13 2001  BP: 118/82  Pulse: 79  Temp: 98.8 F (37.1 C)  Resp: 16   Filed Vitals:   07/23/13 2001  Height: 5' 8.58" (1.742 m)  Weight: 280 lb 8 oz (127.234 kg)   Body mass index is 41.93 kg/(m^2). Ideal Body Weight: Weight in (lb) to have BMI = 25: 166.9  GEN: obese, NAD, Non-toxic, A & O x 3 HEENT: Atraumatic, Normocephalic. Neck supple. No masses, No LAD. Ears and Nose: No external deformity. CV: RRR, No M/G/R. No JVD. No thrill. No extra heart sounds. PULM: CTA B, no wheezes, crackles, rhonchi. No retractions. No resp. distress. No accessory muscle use. ABD: S, NT, ND, +BS. No rebound. No HSM. EXTR: No c/c/e NEURO Normal gait.  PSYCH: Normally interactive. Conversant. Not depressed or anxious appearing.  Calm demeanor.    Assessment and Plan: Acute  bronchospasm Bronchitis Albuterol zpak   Signed,  Ellison Carwin, MD

## 2013-07-24 LAB — CBC WITH DIFFERENTIAL/PLATELET
Basophils Absolute: 0 10*3/uL (ref 0.0–0.1)
Basophils Relative: 0 % (ref 0–1)
EOS ABS: 0.2 10*3/uL (ref 0.0–0.7)
EOS PCT: 3 % (ref 0–5)
HCT: 40 % (ref 36.0–46.0)
HEMOGLOBIN: 13.3 g/dL (ref 12.0–15.0)
Lymphocytes Relative: 46 % (ref 12–46)
Lymphs Abs: 3.3 10*3/uL (ref 0.7–4.0)
MCH: 27.6 pg (ref 26.0–34.0)
MCHC: 33.3 g/dL (ref 30.0–36.0)
MCV: 83 fL (ref 78.0–100.0)
MONOS PCT: 7 % (ref 3–12)
Monocytes Absolute: 0.5 10*3/uL (ref 0.1–1.0)
Neutro Abs: 3.2 10*3/uL (ref 1.7–7.7)
Neutrophils Relative %: 44 % (ref 43–77)
PLATELETS: 344 10*3/uL (ref 150–400)
RBC: 4.82 MIL/uL (ref 3.87–5.11)
RDW: 13.9 % (ref 11.5–15.5)
WBC: 7.2 10*3/uL (ref 4.0–10.5)

## 2013-07-24 LAB — COMPREHENSIVE METABOLIC PANEL
ALT: 18 U/L (ref 0–35)
AST: 13 U/L (ref 0–37)
Albumin: 4.2 g/dL (ref 3.5–5.2)
Alkaline Phosphatase: 103 U/L (ref 39–117)
BUN: 8 mg/dL (ref 6–23)
CALCIUM: 8.9 mg/dL (ref 8.4–10.5)
CHLORIDE: 100 meq/L (ref 96–112)
CO2: 25 meq/L (ref 19–32)
Creat: 0.69 mg/dL (ref 0.50–1.10)
Glucose, Bld: 204 mg/dL — ABNORMAL HIGH (ref 70–99)
Potassium: 3.4 mEq/L — ABNORMAL LOW (ref 3.5–5.3)
Sodium: 137 mEq/L (ref 135–145)
Total Bilirubin: 0.3 mg/dL (ref 0.2–1.2)
Total Protein: 7.1 g/dL (ref 6.0–8.3)

## 2013-07-24 LAB — LIPID PANEL
CHOL/HDL RATIO: 5.8 ratio
CHOLESTEROL: 140 mg/dL (ref 0–200)
HDL: 24 mg/dL — AB (ref >39)
LDL Cholesterol: 68 mg/dL (ref 0–99)
Triglycerides: 241 mg/dL — ABNORMAL HIGH (ref <150)
VLDL: 48 mg/dL — ABNORMAL HIGH (ref 0–40)

## 2013-07-26 ENCOUNTER — Telehealth: Payer: Self-pay | Admitting: *Deleted

## 2013-07-26 NOTE — Telephone Encounter (Signed)
Wellness Screening Results Form filled and faxed. Left message to inform patient.It is ready to be pick up.

## 2013-07-28 LAB — NICOTINE SCREEN, URINE: Nicotine, urine: <2 ng/mL

## 2013-08-02 NOTE — Telephone Encounter (Signed)
Please review labs. 

## 2013-08-02 NOTE — Telephone Encounter (Signed)
Patient wants to know if she needs to have lab work completed, so her form is properly filled out.   Or can she bring back in the form for completion.   [phone]  Test: Nicotine test.

## 2013-08-02 NOTE — Telephone Encounter (Signed)
Her labs are all reviewed.  She can come in and get a copy of her labs or have the results annotated to her form without a formal visit.

## 2013-08-06 ENCOUNTER — Ambulatory Visit (INDEPENDENT_AMBULATORY_CARE_PROVIDER_SITE_OTHER): Payer: 59 | Admitting: Neurology

## 2013-08-06 ENCOUNTER — Encounter: Payer: Self-pay | Admitting: Neurology

## 2013-08-06 VITALS — BP 118/83 | HR 75 | Resp 16 | Ht 67.75 in | Wt 280.0 lb

## 2013-08-06 DIAGNOSIS — F5102 Adjustment insomnia: Secondary | ICD-10-CM | POA: Insufficient documentation

## 2013-08-06 DIAGNOSIS — J209 Acute bronchitis, unspecified: Secondary | ICD-10-CM | POA: Insufficient documentation

## 2013-08-06 DIAGNOSIS — G4726 Circadian rhythm sleep disorder, shift work type: Secondary | ICD-10-CM | POA: Insufficient documentation

## 2013-08-06 NOTE — Telephone Encounter (Signed)
LM for pt to RTC with form so we may fill out the Nicotine portion and refax for her. It was negative. Left clinic hours.

## 2013-08-06 NOTE — Patient Instructions (Signed)
Obesity Obesity is having too much body fat and a body mass index (BMI) of 30 or more. BMI is a number based on your height and weight. The number is an estimate of how much body fat you have. Obesity can happen if you eat more calories than you can burn by exercising or other activity. It can cause major health problems or emergencies.  HOME CARE  Exercise and be active as told by your doctor. Try:  Using stairs when you can.  Parking farther away from store doors.  Gardening, biking, or walking.  Eat healthy foods and drinks that are low in calories. Eat more fruits and vegetables.  Limit fast food, sweets, and snack foods that are made with ingredients that are not natural (processed food).  Eat smaller amounts of food.  Keep a journal and write down what you eat every day. Websites can help with this.  Avoid drinking alcohol. Drink more water and drinks without calories.   Take vitamins and dietary pills (supplements) only as told by your doctor.  Try going to weight-loss support groups or classes to help lessen stress. Dietitians and counselors may also help. GET HELP RIGHT AWAY IF:  You have chest pain or tightness.  You have trouble breathing or feel short of breath.  You feel weak or have loss of feeling (numbness) in your legs.  You feel confused or have trouble talking.  You have sudden changes in your vision. MAKE SURE YOU:  Understand these instructions.  Will watch your condition.  Will get help right away if you are not doing well or get worse. Document Released: 03/15/2011 Document Revised: 05/07/2013 Document Reviewed: 03/15/2011 [facility] Patient Information 2015 [place], [place]. This information is not intended to replace advice given to you by your health care provider. Make sure you discuss any questions you have with your health care provider.

## 2013-08-06 NOTE — Telephone Encounter (Signed)
Pt called and said that no one has called her sense her original message on the 23rd. She had brought in a form to be filled out and faxed when she had her physical done, but it was incomplete, she needs to know if the nicotine portion can be filled out, or if she has to do additional blood work. Please call and advise patient on whether or not we have this information on file along with her paperwork, and if we can just fill it in and refax.

## 2013-08-06 NOTE — Progress Notes (Signed)
[facility] SLEEP MEDICINE CONSULT   Provider:  Larey Seat, M D  Referring Provider: Darreld Mclean, MD Primary Care Physician:  Lamar Blinks, MD  Chief Complaint  Patient presents with  . New Evaluation    Room 11  . 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 Janett Billow  [place] for a sleep evaluation,   Mrs. 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  During that time she stated that she gained 60 pounds, which is not unusual for a sleep worker. She also felt she did never  enough sleep in daytime and often felt exhausted. She would fall asleep easily whenever she would sit down and rest.  She reports that she often would came  Home at 9 AM after a night shift-  but would have to get up at 2:30 PM to pick up her children.  There was definitely not enough time in the day to sleep.  Now, about 4 month ago she changed to early shifts , and she has noted  nocturia and fragmented sleep. She takes benadryl or Flexaril to help sleep.  10 pounds weight loss followed the change from night to day shift. Recently diagnosed as diabetic.   She often wakes up with a dry mouth, which could be related to the diabetes as well as the sleep medication. Overall sleep duration at night is now about 6 hours as fragmentation of sleep. She wakes up up to 3  times from the nocturia. She always runs a fan, white noise.   Bedroom is cool, not quiet and not dark. Watches TV in the bedroom, just started to turn it off.  She does not need an alarm most of the time. Her husband will wake her if she sleeps through. She often incorporates music into her dreams, need the alarm to be set  with a sharp ring tone. She has no frequent night mares, dream intrusions , hallucinations.  She prefers the lateral sleep  position, often on a couch, than transfers to bed. She wakes up on her stomach , but never supine. She has been a coffee drinker  every morning  1 cup , none after lunch.        Review of Systems: Out of a complete 14 system review, the patient complains of only the following symptoms, and all other reviewed systems are negative.  Epworth  2 , FSS  56.  Nocturia, snoring. Interrupted sleep. Diabetes.   History   Social History  . Marital Status: Married    Spouse Name: Kittie Plater    Number of Children: 4  . Years of Education: College   Occupational History  . 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 Smoking status: Former Smoker -- 0.30 packs/day    Types: Cigarettes    Quit date: 07/05/2011  . Smokeless tobacco: Never Used     Comment: Counseling sheet given to quit smoking   . Alcohol Use: No  . Drug Use: No  . Sexual Activity: Yes    Birth Control/ Protection: Surgical   Other Topics Concern  . Not on file   Social History Narrative   Patient is Marital(Miguel) status: married  Children: 4 children.      Employment:  Therapist, sports at [employer]       Education: [employer].  Going back for RN.       Tobacco; quit 2013.       Alcohol: none       Exercise: none in 03/2013.   Patient is right-handed.   Patient drinks one cup of coffee every morning.          Family History  Problem Relation Age of Onset  . Transient ischemic attack Mother   . Hypertension Mother   . Breast cancer Maternal Aunt   . Throat cancer Maternal Aunt   . Lung cancer Paternal Aunt   . Colon cancer Neg Hx   . Glaucoma Maternal Grandmother   . Diabetes Maternal Grandmother   . Drug abuse Father     iv drug abuse    Past Medical History  Diagnosis Date  . Papilloma of breast   . Rectal bleeding   . Anemia   . Obesity   . Allergy   . Diabetes mellitus without complication   . Bronchitis with bronchospasm     Past Surgical History  Procedure Laterality Date   . Tubal ligation    . Cystoscopy    . Breast surgery    . Abdominal hysterectomy      BSO; DUB; fibroids; endometriosis.    Current Outpatient Prescriptions  Medication Sig Dispense Refill  . albuterol (PROVENTIL HFA;VENTOLIN HFA) 108 (90 BASE) MCG/ACT inhaler Inhale 2 puffs into the lungs every 4 (four) hours as needed for wheezing or shortness of breath (cough, shortness of breath or wheezing.).  1 Inhaler  1  . Ascorbic Acid (VITAMIN C PO) Take by mouth. Will take three times a week       . Calcium Carbonate-Vitamin D (CALCIUM PLUS VITAMIN D PO) Take 500 mg by mouth 3 (three) times a week.        . cyclobenzaprine (FLEXERIL) 5 MG tablet 1 pill by mouth up to every 8 hours as needed. Start with one pill by mouth each bedtime as needed due to sedation  30 tablet  5  . ferrous fumarate (HEMOCYTE - 106 MG FE) 325 (106 FE) MG TABS Take 1 tablet by mouth. 3 times a week      . hydrocortisone (ANUSOL-HC) 25 MG suppository Place 1 suppository (25 mg total) rectally 2 (two) times daily.  12 suppository  0  . metFORMIN (GLUCOPHAGE) 500 MG tablet Take 500 mg by mouth daily.      . Multiple Vitamins-Iron (MULTIVITAMIN/IRON PO) Take 1 tablet by mouth. Will take three times a week       . terbinafine (LAMISIL) 250 MG tablet Take 1 tablet (250 mg total) by mouth daily.  30 tablet  1   No current facility-administered medications for this visit.    Allergies as of 08/06/2013  . (No Known Allergies)    Vitals: BP 118/83  Pulse 75  Resp 16  Ht 5' 7.75" (1.721 m)  Wt 280 lb (127.007 kg)  BMI 42.88 kg/m2 Last Weight:  Wt Readings from Last 1 Encounters:  08/06/13 280 lb (127.007 kg)   Last Height:   Ht Readings from Last 1 Encounters:  08/06/13 5' 7.75" (1.721 m)    Physical exam:  General: The patient is awake, alert and appears not in acute distress. The patient is well groomed. Head: Normocephalic, atraumatic. Neck is supple. Mallampati 4 , neck circumference:17.25 , large  tongue.  Cardiovascular:  Regular rate and rhythm 89 , without  murmurs or carotid bruit, and without distended neck veins. Respiratory: Lungs are clear to auscultation. Skin:  Without evidence of edema, or rash Trunk: BMI is 41,  elevated .  Neurologic exam : The patient is awake and alert, oriented to place and time.   Memory subjective described as intact.  There is a normal attention span & concentration ability. Speech is fluent without dysarthria, dysphonia or aphasia. Mood and affect are appropriate.  Cranial nerves: Pupils are equal and briskly reactive to light. Funduscopic exam without evidence of pallor or edema.  Extraocular movements  in vertical and horizontal planes intact and without nystagmus. Visual fields by finger perimetry are intact. Hearing to finger rub intact.  Facial sensation intact to fine touch. Facial motor strength is symmetric and tongue and uvula move midline.  Motor exam:   Normal tone , muscle bulk and symmetric normal strength in all extremities.  Sensory:  Fine touch, pinprick and vibration were tested in all extremities. Proprioception normal.  Coordination: Rapid alternating movements in the fingers/hands is tested and normal.  Finger-to-nose maneuver  without evidence of ataxia, dysmetria or tremor.  Gait and station: Patient walks without assistive device . Deep tendon reflexes: in the  upper and lower extremities are symmetric and intact. Babinski maneuver response is  downgoing.   Assessment:  After physical and neurologic examination, review of laboratory studies, imaging, neurophysiology testing and pre-existing records, assessment is   Obesity, high grade mallampati and neck circumference, snoring and nocturia.  Patient would prefer at [facility] , would return for a titration if needed. [employer].   Plan:  Treatment plan and additional workup : SPLIT study at AHI 20 and score at 4%, get some supine sleep in the diagnostic portion, CO2 if available.   Low carb diets.  Exercise regimen .

## 2013-08-10 NOTE — Telephone Encounter (Signed)
lmom for pt to rtc with form to complete and fax for her.

## 2014-04-01 ENCOUNTER — Encounter: Payer: Self-pay | Admitting: Family Medicine

## 2014-04-01 ENCOUNTER — Ambulatory Visit (INDEPENDENT_AMBULATORY_CARE_PROVIDER_SITE_OTHER): Payer: BLUE CROSS/BLUE SHIELD | Admitting: Family Medicine

## 2014-04-01 VITALS — BP 120/74 | HR 83 | Temp 98.7°F | Resp 16 | Ht 68.0 in | Wt 274.8 lb

## 2014-04-01 DIAGNOSIS — E669 Obesity, unspecified: Secondary | ICD-10-CM

## 2014-04-01 DIAGNOSIS — Z23 Encounter for immunization: Secondary | ICD-10-CM | POA: Diagnosis not present

## 2014-04-01 DIAGNOSIS — L602 Onychogryphosis: Secondary | ICD-10-CM | POA: Diagnosis not present

## 2014-04-01 DIAGNOSIS — Q845 Enlarged and hypertrophic nails: Secondary | ICD-10-CM | POA: Diagnosis not present

## 2014-04-01 DIAGNOSIS — Z1239 Encounter for other screening for malignant neoplasm of breast: Secondary | ICD-10-CM

## 2014-04-01 DIAGNOSIS — N644 Mastodynia: Secondary | ICD-10-CM | POA: Diagnosis not present

## 2014-04-01 DIAGNOSIS — E119 Type 2 diabetes mellitus without complications: Secondary | ICD-10-CM | POA: Diagnosis not present

## 2014-04-01 DIAGNOSIS — M62838 Other muscle spasm: Secondary | ICD-10-CM

## 2014-04-01 LAB — CBC WITH DIFFERENTIAL/PLATELET
BASOS ABS: 0 10*3/uL (ref 0.0–0.1)
BASOS PCT: 0 % (ref 0–1)
EOS PCT: 3 % (ref 0–5)
Eosinophils Absolute: 0.2 10*3/uL (ref 0.0–0.7)
HCT: 42.2 % (ref 36.0–46.0)
Hemoglobin: 13.9 g/dL (ref 12.0–15.0)
LYMPHS PCT: 37 % (ref 12–46)
Lymphs Abs: 2.8 10*3/uL (ref 0.7–4.0)
MCH: 27.9 pg (ref 26.0–34.0)
MCHC: 32.9 g/dL (ref 30.0–36.0)
MCV: 84.7 fL (ref 78.0–100.0)
MONO ABS: 0.3 10*3/uL (ref 0.1–1.0)
MPV: 9.8 fL (ref 8.6–12.4)
Monocytes Relative: 4 % (ref 3–12)
NEUTROS ABS: 4.3 10*3/uL (ref 1.7–7.7)
Neutrophils Relative %: 56 % (ref 43–77)
Platelets: 416 10*3/uL — ABNORMAL HIGH (ref 150–400)
RBC: 4.98 MIL/uL (ref 3.87–5.11)
RDW: 14.2 % (ref 11.5–15.5)
WBC: 7.6 10*3/uL (ref 4.0–10.5)

## 2014-04-01 LAB — COMPLETE METABOLIC PANEL WITH GFR
ALT: 19 U/L (ref 0–35)
AST: 13 U/L (ref 0–37)
Albumin: 4.6 g/dL (ref 3.5–5.2)
Alkaline Phosphatase: 104 U/L (ref 39–117)
BUN: 7 mg/dL (ref 6–23)
CHLORIDE: 97 meq/L (ref 96–112)
CO2: 28 mEq/L (ref 19–32)
Calcium: 9.9 mg/dL (ref 8.4–10.5)
Creat: 0.73 mg/dL (ref 0.50–1.10)
GFR, Est African American: >89 mL/min
GFR, Est Non African American: >89 mL/min
Glucose, Bld: 202 mg/dL — ABNORMAL HIGH (ref 70–99)
POTASSIUM: 4 meq/L (ref 3.5–5.3)
SODIUM: 135 meq/L (ref 135–145)
Total Bilirubin: 0.4 mg/dL (ref 0.2–1.2)
Total Protein: 7.9 g/dL (ref 6.0–8.3)

## 2014-04-01 LAB — POCT GLYCOSYLATED HEMOGLOBIN (HGB A1C): Hemoglobin A1C: 8.4

## 2014-04-01 LAB — LIPID PANEL
CHOL/HDL RATIO: 5 ratio
Cholesterol: 161 mg/dL (ref 0–200)
HDL: 32 mg/dL — ABNORMAL LOW (ref >=46)
LDL Cholesterol: 97 mg/dL (ref 0–99)
Triglycerides: 162 mg/dL — ABNORMAL HIGH (ref <150)
VLDL: 32 mg/dL (ref 0–40)

## 2014-04-01 LAB — TSH: TSH: 3.018 u[IU]/mL (ref 0.350–4.500)

## 2014-04-01 MED ORDER — TERBINAFINE HCL 250 MG PO TABS: 250.0000 mg | ORAL_TABLET | Freq: Every day | ORAL | Status: DC

## 2014-04-01 NOTE — Patient Instructions (Signed)

## 2014-04-01 NOTE — Progress Notes (Addendum)
Subjective:    Patient ID: Kim Delgado, female    DOB: [birth year], 39 y.o.   MRN: [id number]  04/01/2014  Follow-up and Nail Problem   HPI This 39 y.o. female presents for one year follow-up:  1. DMII:  One year follow-up; checking sugars once per week; lowest reading running 150-391.  Nocturia x 1.  Not sleeping through the night.  No excessive thirst.  Has lost 15 pounds in past year.  Taking Metformin every evening; might skip one or two days.  Elevated sugar detected 3 weeks ago.  Intermittent n/t/burning in feet.  Has a burning sensation all over body.  Last eye exam never.   10-12 hours shifts at work. B;  4 baked chicken wingets, coffee with sugar free creamer. Snack: fruit and nut grain bar. Lunch:  Salads or pizza; water or diet pepsi. Snack: fruit n nut grain bars Supper:  Meat, starches, water Snack:  Sometimes.  Comfort eater.    2.  Dystrophic L first nail: evaluated 7/201/15 by Ouida Sills; no toenail fungal cx obtained; treated empirically with Lamisil; took for two months but stopped due to onset of R lower back pain; worried about liver side effects of medication. Toenail remains thickened and intermittently painful.   3.  L breast pain: overdue for mammogram.    Review of Systems  Constitutional: Negative for fever, chills, diaphoresis and fatigue.  HENT: Negative for congestion.   Eyes: Negative for visual disturbance.  Respiratory: Negative for cough and shortness of breath.   Cardiovascular: Negative for chest pain, palpitations and leg swelling.  Gastrointestinal: Negative for nausea, vomiting, abdominal pain, diarrhea and constipation.  Endocrine: Negative for cold intolerance, heat intolerance, polydipsia, polyphagia and polyuria.  Musculoskeletal: Positive for back pain.  Skin: Positive for color change.  Neurological: Negative for dizziness, tremors, seizures, syncope, facial asymmetry, speech difficulty, weakness, light-headedness, numbness and headaches.    Psychiatric/Behavioral: Negative for suicidal ideas, sleep disturbance, self-injury and dysphoric mood. The patient is not nervous/anxious.     Past Medical History  Diagnosis Date  . Papilloma of breast   . Rectal bleeding   . Anemia   . Obesity   . Diabetes mellitus without complication   . Bronchitis with bronchospasm   . Allergy    Past Surgical History  Procedure Laterality Date  . Tubal ligation    . Cystoscopy    . Breast surgery    . Abdominal hysterectomy      BSO; DUB; fibroids; endometriosis.   No Known Allergies Current Outpatient Prescriptions  Medication Sig Dispense Refill  . Ascorbic Acid (VITAMIN C PO) Take by mouth. Will take three times a week     . Calcium Carbonate-Vitamin D (CALCIUM PLUS VITAMIN D PO) Take 500 mg by mouth 3 (three) times a week.      . cyclobenzaprine (FLEXERIL) 5 MG tablet 1 pill by mouth up to every 8 hours as needed. Start with one pill by mouth each bedtime as needed due to sedation 30 tablet 5  . metFORMIN (GLUCOPHAGE) 500 MG tablet Take 500 mg by mouth daily.    Marland Kitchen terbinafine (LAMISIL) 250 MG tablet Take 1 tablet (250 mg total) by mouth daily. 30 tablet 2   No current facility-administered medications for this visit.       Objective:    BP 120/74 mmHg  Pulse 83  Temp(Src) 98.7 F (37.1 C) (Oral)  Resp 16  Ht 5\' 8"  (1.727 m)  Wt 274 lb 12.8 oz (124.648 kg)  BMI 41.79 kg/m2  SpO2 98% Physical Exam  Constitutional: She is oriented to person, place, and time. She appears well-developed and well-nourished. No distress.  HENT:  Head: Normocephalic and atraumatic.  Right Ear: External ear normal.  Left Ear: External ear normal.  Nose: Nose normal.  Mouth/Throat: Oropharynx is clear and moist.  Eyes: Conjunctivae and EOM are normal. Pupils are equal, round, and reactive to light.  Neck: Normal range of motion. Neck supple. Carotid bruit is not present. No thyromegaly present.  Cardiovascular: Normal rate, regular rhythm,  normal heart sounds and intact distal pulses.  Exam reveals no gallop and no friction rub.   No murmur heard. Pulmonary/Chest: Effort normal and breath sounds normal. She has no wheezes. She has no rales.  Abdominal: Soft. Bowel sounds are normal. She exhibits no distension and no mass. There is no tenderness. There is no rebound and no guarding.  Lymphadenopathy:    She has no cervical adenopathy.  Neurological: She is alert and oriented to person, place, and time. No cranial nerve deficit.  Skin: Skin is warm and dry. No rash noted. She is not diaphoretic. No erythema. No pallor.  L first toenail thickened and crumbles easily with sample. Slight scaling around nail and interdigit spaces.  Psychiatric: She has a normal mood and affect. Her behavior is normal.   Results for orders placed or performed in visit on 04/01/14  POCT glycosylated hemoglobin (Hb A1C)  Result Value Ref Range   Hemoglobin A1C 8.4        Assessment & Plan:   1. Type 2 diabetes mellitus without complication   2. Thickened nail   3. Obesity (BMI 30-39.9)   4. Need for prophylactic vaccination and inoculation against influenza   5. Breast cancer screening   6. Mastalgia     1. DMII: worsening; recommend weight loss, exercise, low-sugar and low-carbohydrate food choices.  Pt desires to schedule own ophthalmology appointment. Pt refuses an increase in Metformin to 1000mg  bid; desires to work aggressively on diet and exercise for next three months.  If HgbA1c remains elevated at next visit, will warrant increase in Metformin and possibly addition of second agent. 2.  Thickened nail L first toe: Persistent; send fungal cx; refill of Lamisil provided; RTC six weeks for LFTs. 3.  Mastalgia L intermittent: New; intermittent; refer for mammogram. 4.  Obesity: persistent; recommend weight loss, exercise; recommend https://www.matthews.info/. 5.  S/p flu vaccine.   Meds ordered this encounter  Medications  . terbinafine (LAMISIL)  250 MG tablet    Sig: Take 1 tablet (250 mg total) by mouth daily.    Dispense:  30 tablet    Refill:  2    Return in about 3 months (around 07/02/2014) for complete physical examiniation.     Kristi Elayne Guerin, M.D. [facility] [address] [place], [place]  [address] [phone] phone [phone] fax

## 2014-04-02 LAB — MICROALBUMIN, URINE: Microalb, Ur: 6.8 mg/dL — ABNORMAL HIGH (ref <2.0)

## 2014-04-04 MED ORDER — LISINOPRIL 5 MG PO TABS: 5.0000 mg | ORAL_TABLET | Freq: Every day | ORAL | Status: DC

## 2014-04-04 NOTE — Addendum Note (Signed)
Addended by: Wardell Honour on: 04/04/2014 08:30 AM   Modules accepted: Orders

## 2014-04-05 MED ORDER — CYCLOBENZAPRINE HCL 5 MG PO TABS: ORAL_TABLET | ORAL | Status: DC

## 2014-04-05 MED ORDER — METFORMIN HCL 500 MG PO TABS: 500.0000 mg | ORAL_TABLET | Freq: Two times a day (BID) | ORAL | Status: DC

## 2014-04-05 NOTE — Addendum Note (Signed)
Addended by: Wardell Honour on: 04/05/2014 11:58 AM   Modules accepted: Orders

## 2014-04-28 LAB — CULTURE, FUNGUS WITHOUT SMEAR

## 2014-05-02 ENCOUNTER — Telehealth: Payer: Self-pay

## 2014-05-02 NOTE — Telephone Encounter (Signed)
-  Pt is still worried about her toenail. Let her know there was no fungal growth. Wants to know if she should continue the Lamisil? Also wants to know if she should see a podiatrist.   -Referrals--can we check on the status of the mammogram ordered on 3/28. Pt has not heard anything yet and is still having pain in her breast. Thanks

## 2014-05-02 NOTE — Telephone Encounter (Signed)
LMOM of the below info and that I has sent a message to referrals to look into the mammogram.

## 2014-05-02 NOTE — Telephone Encounter (Signed)
Call patient back --- 1.  Not sure how to interpret fungal culture results.  Pt taking Lamisil last summer may have affected the fungal culture results. Thus, I would complete Lamisil therapy for three total months. Toenails take six months to grow completely out; thus, we will not know if the Lamisil helps any fungal infection for six months after starting Lamisil therapy.  If no improvement in toenail in six months, recommend podiatry evaluation.

## 2014-06-05 ENCOUNTER — Ambulatory Visit (INDEPENDENT_AMBULATORY_CARE_PROVIDER_SITE_OTHER): Payer: BLUE CROSS/BLUE SHIELD | Admitting: Family Medicine

## 2014-06-05 ENCOUNTER — Encounter: Payer: Self-pay | Admitting: Family Medicine

## 2014-06-05 VITALS — BP 130/84 | HR 79 | Temp 98.2°F | Resp 16 | Ht 67.25 in | Wt 273.0 lb

## 2014-06-05 DIAGNOSIS — L603 Nail dystrophy: Secondary | ICD-10-CM | POA: Diagnosis not present

## 2014-06-05 DIAGNOSIS — Z01419 Encounter for gynecological examination (general) (routine) without abnormal findings: Secondary | ICD-10-CM

## 2014-06-05 DIAGNOSIS — Z0000 Encounter for general adult medical examination without abnormal findings: Secondary | ICD-10-CM | POA: Diagnosis not present

## 2014-06-05 DIAGNOSIS — E119 Type 2 diabetes mellitus without complications: Secondary | ICD-10-CM

## 2014-06-05 DIAGNOSIS — E1121 Type 2 diabetes mellitus with diabetic nephropathy: Secondary | ICD-10-CM | POA: Diagnosis not present

## 2014-06-05 NOTE — Patient Instructions (Addendum)
1.  Please schedule your mammogram. 2.  Please schedule your eye exam. 3.  Consider scheduling your sleep study.   Keeping You Healthy  Get These Tests 1. Blood Pressure- Have your blood pressure checked once a year by your health care provider.  Normal blood pressure is 120/80. 2. Weight- Have your body mass index (BMI) calculated to screen for obesity.  BMI is measure of body fat based on height and weight.  You can also calculate your own BMI at GravelBags.it. 3. Cholesterol- Have your cholesterol checked every 5 years starting at age 99 then yearly starting at age 77. 41. Chlamydia, HIV, and other sexually transmitted diseases- Get screened every year until age 54, then within three months of each new sexual provider. 5. Pap Test - Every 1-5 years; discuss with your health care provider. 6. Mammogram- Every 1-2 years starting at age 34--50  Take these medicines  Calcium with Vitamin D-Your body needs 1200 mg of Calcium each day and [phone] IU of Vitamin D daily.  Your body can only absorb 500 mg of Calcium at a time so Calcium must be taken in 2 or 3 divided doses throughout the day.  Multivitamin with folic acid- Once daily if it is possible for you to become pregnant.  Get these Immunizations  Gardasil-Series of three doses; prevents HPV related illness such as genital warts and cervical cancer.  Menactra-Single dose; prevents meningitis.  Tetanus shot- Every 10 years.  Flu shot-Every year.  Take these steps 1. Do not smoke-Your healthcare provider can help you quit.  For tips on how to quit go to www.smokefree.gov or call 1-800 QUITNOW. 2. Be physically active- Exercise 5 days a week for at least 30 minutes.  If you are not already physically active, start slow and gradually work up to 30 minutes of moderate physical activity.  Examples of moderate activity include walking briskly, dancing, swimming, bicycling, etc. 3. Breast Cancer- A self breast exam every month  is important for early detection of breast cancer.  For more information and instruction on self breast exams, ask your healthcare provider or https://www.patel.info/. 4. Eat a healthy diet- Eat a variety of healthy foods such as fruits, vegetables, whole grains, low fat milk, low fat cheeses, yogurt, lean meats, poultry and fish, beans, nuts, tofu, etc.  For more information go to www. Thenutritionsource.org 5. Drink alcohol in moderation- Limit alcohol intake to one drink or less per day. Never drink and drive. 6. Depression- Your emotional health is as important as your physical health.  If you're feeling down or losing interest in things you normally enjoy please talk to your healthcare provider about being screened for depression. 7. Dental visit- Brush and floss your teeth twice daily; visit your dentist twice a year. 8. Eye doctor- Get an eye exam at least every 2 years. 9. Helmet use- Always wear a helmet when riding a bicycle, motorcycle, rollerblading or skateboarding. 64. Safe sex- If you may be exposed to sexually transmitted infections, use a condom. 11. Seat belts- Seat belts can save your live; always wear one. 12. Smoke/Carbon Monoxide detectors- These detectors need to be installed on the appropriate level of your home. Replace batteries at least once a year. 13. Skin cancer- When out in the sun please cover up and use sunscreen 15 SPF or higher. 14. Violence- If anyone is threatening or hurting you, please tell your healthcare provider.

## 2014-06-05 NOTE — Progress Notes (Signed)
Subjective:    Patient ID: Kim Delgado, female    DOB: April 13, 1975, 39 y.o.   MRN: [id number]   HPI This 39 y.o. female presents for Complete Physical Examination.  Last physical:  2015 Pap smear:  2013 [facility].   Mammogram:  Did not undergo mammogram.  Pt plans on calling. [facility]. Colonoscopy:  2012; +internal hemorrhoids TDAP:  2014 Pneumovax:  2014 Hepatitis B series: 2014 Influenza:  04/01/2014 Eye exam:  Has not scheduled yet.  Needs to see insurance coverage. Dental exam:  Every six months; root canal.  DMII:  Taking Metformin one tablet once daily.  Has diarrhea with higher dose of Metformin.  +urine microalbumin at last visit.  Has taken Lisinopril four times.  Dystrophic nail: fungal culture negative yet had taken course in past.  Stopped medication with negative fungal cx; plans to restart.  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    Review of Systems  Constitutional: Positive for fatigue. Negative for fever, chills, diaphoresis, activity change, appetite change and unexpected weight change.  HENT: Negative.  Negative for congestion, dental problem, drooling, ear discharge, ear pain, facial swelling, hearing loss, mouth sores, nosebleeds, postnasal drip, rhinorrhea, sinus pressure, sneezing, sore throat, tinnitus, trouble swallowing and voice change.   Eyes: Negative.  Negative for photophobia, pain, discharge, redness, itching and visual disturbance.  Respiratory: Negative.  Negative for apnea, cough, choking, chest tightness, shortness of breath, wheezing and stridor.   Cardiovascular: Negative.  Negative for chest pain, palpitations and leg swelling.  Gastrointestinal: Positive for constipation. Negative for nausea, vomiting, abdominal pain, diarrhea, blood in stool, abdominal distention, anal bleeding and rectal pain.  Endocrine: Negative.  Negative for cold intolerance, heat  intolerance, polydipsia, polyphagia and polyuria.  Genitourinary: Negative.  Negative for dysuria, urgency, frequency, hematuria, flank pain, decreased urine volume, vaginal bleeding, vaginal discharge, enuresis, difficulty urinating, genital sores, vaginal pain, menstrual problem, pelvic pain and dyspareunia.  Musculoskeletal: Negative.  Negative for myalgias, back pain, joint swelling, arthralgias, gait problem, neck pain and neck stiffness.  Skin: Negative.  Negative for color change, pallor, rash and wound.  Allergic/Immunologic: Positive for environmental allergies. Negative for food allergies and immunocompromised state.  Neurological: Negative.  Negative for dizziness, tremors, seizures, syncope, facial asymmetry, speech difficulty, weakness, light-headedness, numbness and headaches.  Hematological: Negative.  Negative for adenopathy. Does not bruise/bleed easily.  Psychiatric/Behavioral: Positive for sleep disturbance. Negative for suicidal ideas, hallucinations, behavioral problems, confusion, self-injury, dysphoric mood, decreased concentration and agitation. The patient is not nervous/anxious and is not hyperactive.    Past Medical History  Diagnosis Date  . Papilloma of breast   . Rectal bleeding   . Anemia   . Obesity   . Diabetes mellitus without complication   . Bronchitis with bronchospasm   . Allergy    Past Surgical History  Procedure Laterality Date  . Tubal ligation    . Cystoscopy    . Breast surgery    . Abdominal hysterectomy      BSO; CERVIX INTACT; DUB; fibroids; endometriosis.  Dove.   No Known Allergies History   Social History  . Marital Status: Married    Spouse Name: [place]  . Number of Children: 4  . Years of Education: College   Occupational History  . 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 Smoking status: Former Smoker -- 0.30 packs/day  Types: Cigarettes    Quit date: 07/05/2011  . Smokeless tobacco: Never Used      Comment: Counseling sheet given to quit smoking   . Alcohol Use: No  . Drug Use: No  . Sexual Activity: Yes    Birth Control/ Protection: Surgical   Other Topics Concern  . Not on file   Social History Narrative   Patient is Marital(Miguel) status: married x 12 years.  Met 25 years ago.  No abuse.       Children: 4 children (21, 13, 11, 9); no grandchildren.      Lives: with husband 4 children.      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       Education: [employer].  Only has two year RN: wanting to go back for BSN.       Tobacco; quit 07/2011.  Smoked x 20 years.       Alcohol: wine weekly 1 per week; rarely.       Exercise: none in 03/2013; some exercise 2 times per week; walking.       Seatbelt: 100%      Guns: none      Patient is right-handed.   Patient drinks one cup of coffee every morning.         Family History  Problem Relation Age of Onset  . Transient ischemic attack Mother 26    x 2  . Hypertension Mother   . Diabetes Mother     diet controlled  . Stroke Mother   . Breast cancer Maternal Aunt   . Throat cancer Maternal Aunt   . Lung cancer Paternal Aunt   . Colon cancer Neg Hx   . Glaucoma Maternal Grandmother   . Diabetes Maternal Grandmother   . Drug abuse Father     iv drug abuse       Objective:   Physical Exam  Constitutional: She is oriented to person, place, and time. She appears well-developed and well-nourished. No distress.  Obese.  HENT:  Head: Normocephalic and atraumatic.  Right Ear: External ear normal.  Left Ear: External ear normal.  Nose: Nose normal.  Mouth/Throat: Oropharynx is clear and moist.  Eyes: Conjunctivae and EOM are normal. Pupils are equal, round, and reactive to light.  Neck: Normal range of motion and full passive range of motion without pain. Neck supple. No JVD present. Carotid bruit is not present. No thyromegaly present.  Cardiovascular: Normal rate, regular  rhythm and normal heart sounds.  Exam reveals no gallop and no friction rub.   No murmur heard. Pulmonary/Chest: Effort normal and breath sounds normal. She has no wheezes. She has no rales. Right breast exhibits no inverted nipple, no mass, no nipple discharge, no skin change and no tenderness. Left breast exhibits no inverted nipple, no mass, no nipple discharge, no skin change and no tenderness. Breasts are symmetrical.  Abdominal: Soft. Bowel sounds are normal. She exhibits no distension and no mass. There is no tenderness. There is no rebound and no guarding.  Genitourinary: Vagina normal. There is no rash, tenderness or lesion on the right labia. There is no rash, tenderness or lesion on the left labia. Cervix exhibits no motion tenderness, no discharge and no friability.  Musculoskeletal:       Right shoulder: Normal.       Left shoulder: Normal.       Cervical back: Normal.  Lymphadenopathy:    She has no  cervical adenopathy.  Neurological: She is alert and oriented to person, place, and time. She has normal reflexes. No cranial nerve deficit. She exhibits normal muscle tone. Coordination normal.  Skin: Skin is warm and dry. No rash noted. She is not diaphoretic. No erythema. No pallor.  Psychiatric: She has a normal mood and affect. Her behavior is normal. Judgment and thought content normal.  Nursing note and vitals reviewed.       Assessment & Plan:   1. Routine physical examination   2. Encounter for routine gynecological examination   3. Diabetes mellitus without complication   4. Type 2 diabetes mellitus with diabetic nephropathy   5. Dystrophic nail    1. Complete Physical examination: anticipatory guidance --- weight loss, exercise.  Pap smear obtained.  Pt to schedule mammogram.  Immunizations UTD. 2.  Gynecological exam: pap smear obtained since cervix intact; pt to schedule mammogram; s/p hysterectomy with BSO for fibroids. 3.  DM with diabetic nephropathy: stable;  encourage patient to take Lisinopril daily; pt cannot tolerate higher dose of Metformin than 539m daily; recommend three month trial of weight loss, exercise, dietary modification. Consider addition of second agent if HgA1c remains > 7.0 4. Dystrophic nails: persistent; negative fungal cx; recommend completing Lamisil therapy. Obtain LFTs.  Meds ordered this encounter  Medications  . terbinafine (LAMISIL) 250 MG tablet    Sig: Take 250 mg by mouth daily.    Kristi MElayne Guerin M.D. [facility] [place]  [phone]

## 2014-06-06 LAB — PAP IG AND HPV HIGH-RISK: HPV DNA High Risk: NOT DETECTED

## 2014-09-16 ENCOUNTER — Ambulatory Visit (INDEPENDENT_AMBULATORY_CARE_PROVIDER_SITE_OTHER): Payer: BLUE CROSS/BLUE SHIELD | Admitting: Family Medicine

## 2014-09-16 ENCOUNTER — Encounter: Payer: Self-pay | Admitting: Family Medicine

## 2014-09-16 VITALS — BP 124/80 | HR 77 | Temp 98.6°F | Resp 16 | Ht 67.0 in | Wt 273.4 lb

## 2014-09-16 DIAGNOSIS — Z23 Encounter for immunization: Secondary | ICD-10-CM | POA: Diagnosis not present

## 2014-09-16 DIAGNOSIS — J301 Allergic rhinitis due to pollen: Secondary | ICD-10-CM

## 2014-09-16 DIAGNOSIS — L603 Nail dystrophy: Secondary | ICD-10-CM | POA: Diagnosis not present

## 2014-09-16 DIAGNOSIS — E1121 Type 2 diabetes mellitus with diabetic nephropathy: Secondary | ICD-10-CM

## 2014-09-16 LAB — LIPID PANEL
CHOL/HDL RATIO: 4.3 ratio (ref <=5.0)
Cholesterol: 139 mg/dL (ref 125–200)
HDL: 32 mg/dL — AB (ref >=46)
LDL CALC: 74 mg/dL (ref <130)
TRIGLYCERIDES: 167 mg/dL — AB (ref <150)
VLDL: 33 mg/dL — ABNORMAL HIGH (ref <30)

## 2014-09-16 LAB — COMPREHENSIVE METABOLIC PANEL
ALBUMIN: 4.2 g/dL (ref 3.6–5.1)
ALT: 17 U/L (ref 6–29)
AST: 12 U/L (ref 10–30)
Alkaline Phosphatase: 89 U/L (ref 33–115)
BILIRUBIN TOTAL: 0.4 mg/dL (ref 0.2–1.2)
BUN: 11 mg/dL (ref 7–25)
CALCIUM: 9.3 mg/dL (ref 8.6–10.2)
CO2: 28 mmol/L (ref 20–31)
Chloride: 104 mmol/L (ref 98–110)
Creat: 0.73 mg/dL (ref 0.50–1.10)
GLUCOSE: 210 mg/dL — AB (ref 65–99)
POTASSIUM: 4.1 mmol/L (ref 3.5–5.3)
Sodium: 139 mmol/L (ref 135–146)
Total Protein: 7.2 g/dL (ref 6.1–8.1)

## 2014-09-16 LAB — HEMOGLOBIN A1C: HEMOGLOBIN A1C: 7.4 % — AB (ref 4.0–6.0)

## 2014-09-16 LAB — GLUCOSE, POCT (MANUAL RESULT ENTRY): POC Glucose: 229 mg/dl — AB (ref 70–99)

## 2014-09-16 LAB — POCT GLYCOSYLATED HEMOGLOBIN (HGB A1C): Hemoglobin A1C: 7.4

## 2014-09-16 MED ORDER — METFORMIN HCL 500 MG PO TABS: 500.0000 mg | ORAL_TABLET | Freq: Two times a day (BID) | ORAL | Status: DC

## 2014-09-16 NOTE — Patient Instructions (Signed)
Diabetes and Exercise Exercising regularly is important. It is not just about losing weight. It has many health benefits, such as:  Improving your overall fitness, flexibility, and endurance.  Increasing your bone density.  Helping with weight control.  Decreasing your body fat.  Increasing your muscle strength.  Reducing stress and tension.  Improving your overall health. People with diabetes who exercise gain additional benefits because exercise:  Reduces appetite.  Improves the body's use of blood sugar (glucose).  Helps lower or control blood glucose.  Decreases blood pressure.  Helps control blood lipids (such as cholesterol and triglycerides).  Improves the body's use of the hormone insulin by:  Increasing the body's insulin sensitivity.  Reducing the body's insulin needs.  Decreases the risk for heart disease because exercising:  Lowers cholesterol and triglycerides levels.  Increases the levels of good cholesterol (such as high-density lipoproteins [HDL]) in the body.  Lowers blood glucose levels. YOUR ACTIVITY PLAN  Choose an activity that you enjoy and set realistic goals. Your health care provider or diabetes educator can help you make an activity plan that works for you. Exercise regularly as directed by your health care provider. This includes:  Performing resistance training twice a week such as push-ups, sit-ups, lifting weights, or using resistance bands.  Performing 150 minutes of cardio exercises each week such as walking, running, or playing sports.  Staying active and spending no more than 90 minutes at one time being inactive. Even short bursts of exercise are good for you. Three 10-minute sessions spread throughout the day are just as beneficial as a single 30-minute session. Some exercise ideas include:  Taking the dog for a walk.  Taking the stairs instead of the elevator.  Dancing to your favorite song.  Doing an exercise  video.  Doing your favorite exercise with a friend. RECOMMENDATIONS FOR EXERCISING WITH TYPE 1 OR TYPE 2 DIABETES   Check your blood glucose before exercising. If blood glucose levels are greater than 240 mg/dL, check for urine ketones. Do not exercise if ketones are present.  Avoid injecting insulin into areas of the body that are going to be exercised. For example, avoid injecting insulin into:  The arms when playing tennis.  The legs when jogging.  Keep a record of:  Food intake before and after you exercise.  Expected peak times of insulin action.  Blood glucose levels before and after you exercise.  The type and amount of exercise you have done.  Review your records with your health care provider. Your health care provider will help you to develop guidelines for adjusting food intake and insulin amounts before and after exercising.  If you take insulin or oral hypoglycemic agents, watch for signs and symptoms of hypoglycemia. They include:  Dizziness.  Shaking.  Sweating.  Chills.  Confusion.  Drink plenty of water while you exercise to prevent dehydration or heat stroke. Body water is lost during exercise and must be replaced.  Talk to your health care provider before starting an exercise program to make sure it is safe for you. Remember, almost any type of activity is better than none. Document Released: 03/13/2003 Document Revised: 05/07/2013 Document Reviewed: 05/30/2012 ExitCare Patient Information 2015 [employer]. This information is not intended to replace advice given to you by your health care provider. Make sure you discuss any questions you have with your health care provider.  

## 2014-09-16 NOTE — Progress Notes (Signed)
Subjective:    Patient ID: Kim Delgado, female    DOB: 02-01-1975, 39 y.o.   MRN: [id number]  09/16/2014  Diabetes   HPI This 39 y.o. female presents for six month follow-up:   1. DMII:  Six month follow-up; increased Metformin one month ago to bid.  Sugars running 100s; went back to nights; less stressful; enjoying nights.  Off on eating.  If takes Metformin steadily, tolerating well; intermittent diarrhea.  Checking sugars once weekly.  Not exercising; no weight loss.   Did take Lisinopril intermittently.  Has not taken recently for urine microalbuminuria.  Decided to take Metformin more regularly which should help with renal manifestations.    2. Dystrophic nails: just completed Lamisil; having some pain in toe.  Nail was ingrown.  Did have tightness in leg; walked out with improvement; no swelling in legs.  No warmth in leg.   3. Nasal congestion: last week; took 3 days of Cefdinir and decongestant; feels well.  No bloody congestion.  No sneezing; very congested.  Saline nasal spray and Claritin.    4. Health maintenance: has not had mammogram.  Expense is the limitation; pap smear normal.  Reqeusting flu vaccine.    Review of Systems  Constitutional: Negative for fever, chills, diaphoresis and fatigue.  HENT: Positive for congestion. Negative for ear pain, postnasal drip, rhinorrhea, sinus pressure and sore throat.   Eyes: Negative for visual disturbance.  Respiratory: Negative for cough and shortness of breath.   Cardiovascular: Negative for chest pain, palpitations and leg swelling.  Gastrointestinal: Negative for nausea, vomiting, abdominal pain, diarrhea and constipation.  Endocrine: Negative for cold intolerance, heat intolerance, polydipsia, polyphagia and polyuria.  Skin: Negative for color change, pallor, rash and wound.  Neurological: Negative for dizziness, tremors, seizures, syncope, facial asymmetry, speech difficulty, weakness, light-headedness, numbness and  headaches.    Past Medical History  Diagnosis Date  . Papilloma of breast   . Rectal bleeding   . Anemia   . Obesity   . Diabetes mellitus without complication   . Bronchitis with bronchospasm   . Allergy    Past Surgical History  Procedure Laterality Date  . Tubal ligation    . Cystoscopy    . Breast surgery    . Abdominal hysterectomy      BSO; CERVIX INTACT; DUB; fibroids; endometriosis.  Dove.   No Known Allergies Current Outpatient Prescriptions  Medication Sig Dispense Refill  . Ascorbic Acid (VITAMIN C PO) Take by mouth. Will take three times a week     . Calcium Carbonate-Vitamin D (CALCIUM PLUS VITAMIN D PO) Take 500 mg by mouth 3 (three) times a week.      . cyclobenzaprine (FLEXERIL) 5 MG tablet 1 pill by mouth up to every 8 hours as needed. Start with one pill by mouth each bedtime as needed due to sedation 30 tablet 5  . lisinopril (PRINIVIL,ZESTRIL) 5 MG tablet Take 1 tablet (5 mg total) by mouth daily. 30 tablet 5  . metFORMIN (GLUCOPHAGE) 500 MG tablet Take 1 tablet (500 mg total) by mouth 2 (two) times daily with a meal. 180 tablet 1  . terbinafine (LAMISIL) 250 MG tablet Take 250 mg by mouth daily.     No current facility-administered medications for this visit.   Social History   Social History  . Marital Status: Married    Spouse Name: [place]  . Number of Children: 4  . Years of Education: College   Occupational History  . 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 Smoking status: Former Smoker -- 0.30 packs/day    Types: Cigarettes    Quit date: 07/05/2011  . Smokeless tobacco: Never Used     Comment: Counseling sheet given to quit smoking   . Alcohol Use: No  . Drug Use: No  . Sexual Activity: Yes    Birth Control/ Protection: Surgical   Other Topics Concern  . Not on file   Social History Narrative   Patient is Marital(Miguel) status: married x 12 years.  Met 25 years ago.  No abuse.       Children: 4 children  (21, 13, 11, 9); no grandchildren.      Lives: with husband 4 children.      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       Education: [employer].  Only has two year RN: wanting to go back for BSN.       Tobacco; quit 07/2011.  Smoked x 20 years.       Alcohol: wine weekly 1 per week; rarely.       Exercise: none in 03/2013; some exercise 2 times per week; walking.       Seatbelt: 100%      Guns: none      Patient is right-handed.   Patient drinks one cup of coffee every morning.         Family History  Problem Relation Age of Onset  . Transient ischemic attack Mother 25    x 2  . Hypertension Mother   . Diabetes Mother     diet controlled  . Stroke Mother   . Breast cancer Maternal Aunt   . Throat cancer Maternal Aunt   . Lung cancer Paternal Aunt   . Colon cancer Neg Hx   . Glaucoma Maternal Grandmother   . Diabetes Maternal Grandmother   . Drug abuse Father     iv drug abuse       Objective:    BP 124/80 mmHg  Pulse 77  Temp(Src) 98.6 F (37 C) (Oral)  Resp 16  Ht _0  (1.702 m)  Wt 273 lb 6.4 oz (124.013 kg)  BMI 42.81 kg/m2  SpO2 99% Physical Exam  Constitutional: She is oriented to person, place, and time. She appears well-developed and well-nourished. No distress.  HENT:  Head: Normocephalic and atraumatic.  Right Ear: External ear normal.  Left Ear: External ear normal.  Nose: Nose normal.  Mouth/Throat: Oropharynx is clear and moist.  Eyes: Conjunctivae and EOM are normal. Pupils are equal, round, and reactive to light.  Neck: Normal range of motion. Neck supple. Carotid bruit is not present. No thyromegaly present.  Cardiovascular: Normal rate, regular rhythm, normal heart sounds and intact distal pulses.  Exam reveals no gallop and no friction rub.   No murmur heard. Pulmonary/Chest: Effort normal and breath sounds normal. She has no wheezes. She has no rales.  Abdominal: Soft. Bowel sounds are normal.  She exhibits no distension and no mass. There is no tenderness. There is no rebound and no guarding.  Lymphadenopathy:    She has no cervical adenopathy.  Neurological: She is alert and oriented to person, place, and time. No cranial nerve deficit.  Skin: Skin is warm and dry. No rash noted. She is not diaphoretic. No erythema. No pallor.  L first toenail with thickening.  Psychiatric: She has a normal mood and affect.  Her behavior is normal.   Results for orders placed or performed in visit on 09/16/14  Comprehensive metabolic panel  Result Value Ref Range   Sodium 139 135 - 146 mmol/L   Potassium 4.1 3.5 - 5.3 mmol/L   Chloride 104 98 - 110 mmol/L   CO2 28 20 - 31 mmol/L   Glucose, Bld 210 (H) 65 - 99 mg/dL   BUN 11 7 - 25 mg/dL   Creat 0.73 0.50 - 1.10 mg/dL   Total Bilirubin 0.4 0.2 - 1.2 mg/dL   Alkaline Phosphatase 89 33 - 115 U/L   AST 12 10 - 30 U/L   ALT 17 6 - 29 U/L   Total Protein 7.2 6.1 - 8.1 g/dL   Albumin 4.2 3.6 - 5.1 g/dL   Calcium 9.3 8.6 - 10.2 mg/dL  Lipid panel  Result Value Ref Range   Cholesterol 139 125 - 200 mg/dL   Triglycerides 167 (H) <150 mg/dL   HDL 32 (L) >=46 mg/dL   Total CHOL/HDL Ratio 4.3 <=5.0 Ratio   VLDL 33 (H) <30 mg/dL   LDL Cholesterol 74 <130 mg/dL  POCT glucose (manual entry)  Result Value Ref Range   POC Glucose 229 (A) 70 - 99 mg/dl  POCT glycosylated hemoglobin (Hb A1C)  Result Value Ref Range   Hemoglobin A1C 7.4    INFLUENZA VACCINE ADMINISTERED.    Assessment & Plan:   1. Type 2 diabetes mellitus with diabetic nephropathy   2. Flu vaccine need   3. Dystrophic nail   4. Allergic rhinitis due to pollen     1. DMII: Uncontrolled but improving; pt refused to increase Metformin to tid; continue bid dosing; highly recommend weight loss, exercise, dietary modifications.  Refuses Lisinopril for nephropathy. 2.  Dystrophic nail: s/p Lamisil three months of therapy; will take 3 additional months for nail to grow.  Obtain  labs. 3.  Allergic Rhinitis; worsening recently and now improved; recommend Claritin 55m daily for the next two months; can add Flonase if needed. 4. S/p flu vaccine.  Orders Placed This Encounter  Procedures  . Flu Vaccine QUAD 36+ mos IM  . Comprehensive metabolic panel    Order Specific Question:  Has the patient fasted?    Answer:  Yes  . Lipid panel    Order Specific Question:  Has the patient fasted?    Answer:  Yes  . POCT glucose (manual entry)  . POCT glycosylated hemoglobin (Hb A1C)   Meds ordered this encounter  Medications  . metFORMIN (GLUCOPHAGE) 500 MG tablet    Sig: Take 1 tablet (500 mg total) by mouth 2 (two) times daily with a meal.    Dispense:  180 tablet    Refill:  1    Return in about 4 months (around 01/16/2015) for recheck.   Kristi MElayne Guerin M.D. [facility] [place]  [phone]

## 2014-10-30 ENCOUNTER — Encounter: Payer: Self-pay | Admitting: Family Medicine

## 2015-01-24 ENCOUNTER — Encounter: Payer: Self-pay | Admitting: Family Medicine

## 2015-01-29 ENCOUNTER — Encounter: Payer: Self-pay | Admitting: Family Medicine

## 2015-02-17 ENCOUNTER — Ambulatory Visit: Payer: BLUE CROSS/BLUE SHIELD | Admitting: Family Medicine

## 2015-02-19 ENCOUNTER — Ambulatory Visit (INDEPENDENT_AMBULATORY_CARE_PROVIDER_SITE_OTHER): Payer: BLUE CROSS/BLUE SHIELD | Admitting: Family Medicine

## 2015-02-19 ENCOUNTER — Encounter: Payer: Self-pay | Admitting: Family Medicine

## 2015-02-19 VITALS — BP 127/78 | HR 86 | Temp 98.3°F | Resp 16 | Wt 275.6 lb

## 2015-02-19 DIAGNOSIS — E785 Hyperlipidemia, unspecified: Secondary | ICD-10-CM | POA: Diagnosis not present

## 2015-02-19 DIAGNOSIS — E1121 Type 2 diabetes mellitus with diabetic nephropathy: Secondary | ICD-10-CM

## 2015-02-19 DIAGNOSIS — Z114 Encounter for screening for human immunodeficiency virus [HIV]: Secondary | ICD-10-CM | POA: Diagnosis not present

## 2015-02-19 LAB — CBC WITH DIFFERENTIAL/PLATELET
BASOS PCT: 0 % (ref 0–1)
Basophils Absolute: 0 10*3/uL (ref 0.0–0.1)
Eosinophils Absolute: 0.1 10*3/uL (ref 0.0–0.7)
Eosinophils Relative: 2 % (ref 0–5)
HEMATOCRIT: 39.7 % (ref 36.0–46.0)
HEMOGLOBIN: 13.1 g/dL (ref 12.0–15.0)
LYMPHS PCT: 37 % (ref 12–46)
Lymphs Abs: 2.5 10*3/uL (ref 0.7–4.0)
MCH: 28.5 pg (ref 26.0–34.0)
MCHC: 33 g/dL (ref 30.0–36.0)
MCV: 86.3 fL (ref 78.0–100.0)
MONO ABS: 0.3 10*3/uL (ref 0.1–1.0)
MONOS PCT: 5 % (ref 3–12)
MPV: 9.8 fL (ref 8.6–12.4)
NEUTROS ABS: 3.8 10*3/uL (ref 1.7–7.7)
Neutrophils Relative %: 56 % (ref 43–77)
Platelets: 357 10*3/uL (ref 150–400)
RBC: 4.6 MIL/uL (ref 3.87–5.11)
RDW: 13.5 % (ref 11.5–15.5)
WBC: 6.8 10*3/uL (ref 4.0–10.5)

## 2015-02-19 LAB — GLUCOSE, POCT (MANUAL RESULT ENTRY): POC Glucose: 272 mg/dl — AB (ref 70–99)

## 2015-02-19 LAB — COMPREHENSIVE METABOLIC PANEL
ALBUMIN: 4 g/dL (ref 3.6–5.1)
ALT: 18 U/L (ref 6–29)
AST: 13 U/L (ref 10–30)
Alkaline Phosphatase: 100 U/L (ref 33–115)
BUN: 12 mg/dL (ref 7–25)
CALCIUM: 9.1 mg/dL (ref 8.6–10.2)
CHLORIDE: 102 mmol/L (ref 98–110)
CO2: 25 mmol/L (ref 20–31)
Creat: 1 mg/dL (ref 0.50–1.10)
Glucose, Bld: 249 mg/dL — ABNORMAL HIGH (ref 65–99)
Potassium: 4 mmol/L (ref 3.5–5.3)
Sodium: 138 mmol/L (ref 135–146)
Total Bilirubin: 0.3 mg/dL (ref 0.2–1.2)
Total Protein: 7.2 g/dL (ref 6.1–8.1)

## 2015-02-19 LAB — HIV ANTIBODY (ROUTINE TESTING W REFLEX): HIV: NONREACTIVE

## 2015-02-19 LAB — POCT GLYCOSYLATED HEMOGLOBIN (HGB A1C): HEMOGLOBIN A1C: 8.3

## 2015-02-19 NOTE — Progress Notes (Signed)
Subjective:    Patient ID: Kim Delgado, female    DOB: 1975/06/26, 40 y.o.   MRN: [id number]  02/19/2015  Diabetes   HPI This 40 y.o. female presents for evaluation of diabetes.  Checking sugars three times per week.  Sugars running 300 but has been lower since then.  Metformin 570m bid more consistent.  Elevated sugar of 300 motivated patient.  Non-fasting this morning; ate bacon and eggs and water and coffee.  Exercising some; 2-3 times per week; cardio more elliptical.  Also started working with trainer to do boxing for past month.   Never went to eye doctor. Did not take Lisinopril.   Diagnosed four years ago.   Shooting pain in L leg; down to the bone.  Posterior calf to thigh; intermittent; none in 3 days.  Duration 1-2 days and then improves.  Tightness.  Will take ASA with relief.  No n/t but +burning. No b/b dysfunction.    No mammogram yet.  Has suffered with pain in R breast.   Dystrophic nail: now better.  Stopped taking medication.  Took Lamisil 3 months.    Pt moved into apartment.     Review of Systems  Constitutional: Negative for fever, chills, diaphoresis and fatigue.  Eyes: Negative for visual disturbance.  Respiratory: Negative for cough and shortness of breath.   Cardiovascular: Negative for chest pain, palpitations and leg swelling.  Gastrointestinal: Negative for nausea, vomiting, abdominal pain, diarrhea and constipation.  Endocrine: Negative for cold intolerance, heat intolerance, polydipsia, polyphagia and polyuria.  Neurological: Negative for dizziness, tremors, seizures, syncope, facial asymmetry, speech difficulty, weakness, light-headedness, numbness and headaches.    Past Medical History  Diagnosis Date  . Papilloma of breast   . Rectal bleeding   . Anemia   . Obesity   . Diabetes mellitus without complication ([place]   . Bronchitis with bronchospasm   . Allergy    Past Surgical History  Procedure Laterality Date  . Tubal ligation    .  Cystoscopy    . Breast surgery    . Abdominal hysterectomy      BSO; CERVIX INTACT; DUB; fibroids; endometriosis.  [place].   Not on File  Social History   Social History  . Marital Status: Married    Spouse Name: [place] . Number of Children: 4  . Years of Education: College   Occupational History  . 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 Smoking status: Former Smoker -- 0.30 packs/day    Types: Cigarettes    Quit date: 07/05/2011  . Smokeless tobacco: Never Used     Comment: Counseling sheet given to quit smoking   . Alcohol Use: No  . Drug Use: No  . Sexual Activity: Yes    Birth Control/ Protection: Surgical   Other Topics Concern  . Not on file   Social History Narrative   Patient is Marital(Miguel) status: married x 12 years.  Met 25 years ago.  No abuse.       Children: 4 children (21, 13, 11, 9); no grandchildren.      Lives: with husband 4 children.      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       Education: [employer]  Only has two year RN: wanting to go back for BSN.       Tobacco; quit 07/2011.  Smoked x 20 years.  Alcohol: wine weekly 1 per week; rarely.       Exercise: none in 03/2013; some exercise 2 times per week; walking.       Seatbelt: 100%      Guns: none      Patient is right-handed.   Patient drinks one cup of coffee every morning.         Family History  Problem Relation Age of Onset  . Transient ischemic attack Mother 72    x 2  . Hypertension Mother   . Diabetes Mother     diet controlled  . Stroke Mother   . Breast cancer Maternal Aunt   . Throat cancer Maternal Aunt   . Lung cancer Paternal Aunt   . Colon cancer Neg Hx   . Glaucoma Maternal Grandmother   . Diabetes Maternal Grandmother   . Drug abuse Father     iv drug abuse       Objective:    BP 127/78 mmHg  Pulse 86  Temp(Src) 98.3 F (36.8 C) (Oral)  Resp 16  Wt 275 lb 9.6 oz (125.011  kg) Physical Exam  Constitutional: She is oriented to person, place, and time. She appears well-developed and well-nourished. No distress.  HENT:  Head: Normocephalic and atraumatic.  Right Ear: External ear normal.  Left Ear: External ear normal.  Nose: Nose normal.  Mouth/Throat: Oropharynx is clear and moist.  Eyes: Conjunctivae and EOM are normal. Pupils are equal, round, and reactive to light.  Neck: Normal range of motion. Neck supple. Carotid bruit is not present. No thyromegaly present.  Cardiovascular: Normal rate, regular rhythm, normal heart sounds and intact distal pulses.  Exam reveals no gallop and no friction rub.   No murmur heard. Pulmonary/Chest: Effort normal and breath sounds normal. She has no wheezes. She has no rales.  Abdominal: Soft. Bowel sounds are normal. She exhibits no distension and no mass. There is no tenderness. There is no rebound and no guarding.  Lymphadenopathy:    She has no cervical adenopathy.  Neurological: She is alert and oriented to person, place, and time. No cranial nerve deficit.  Skin: Skin is warm and dry. No rash noted. She is not diaphoretic. No erythema. No pallor.  Psychiatric: She has a normal mood and affect. Her behavior is normal.   Results for orders placed or performed in visit on 02/19/15  CBC with Differential/Platelet  Result Value Ref Range   WBC 6.8 4.0 - 10.5 K/uL   RBC 4.60 3.87 - 5.11 MIL/uL   Hemoglobin 13.1 12.0 - 15.0 g/dL   HCT 39.7 36.0 - 46.0 %   MCV 86.3 78.0 - 100.0 fL   MCH 28.5 26.0 - 34.0 pg   MCHC 33.0 30.0 - 36.0 g/dL   RDW 13.5 11.5 - 15.5 %   Platelets 357 150 - 400 K/uL   MPV 9.8 8.6 - 12.4 fL   Neutrophils Relative % 56 43 - 77 %   Neutro Abs 3.8 1.7 - 7.7 K/uL   Lymphocytes Relative 37 12 - 46 %   Lymphs Abs 2.5 0.7 - 4.0 K/uL   Monocytes Relative 5 3 - 12 %   Monocytes Absolute 0.3 0.1 - 1.0 K/uL   Eosinophils Relative 2 0 - 5 %   Eosinophils Absolute 0.1 0.0 - 0.7 K/uL   Basophils  Relative 0 0 - 1 %   Basophils Absolute 0.0 0.0 - 0.1 K/uL   Smear Review Criteria for review not met  Comprehensive metabolic panel  Result Value Ref Range   Sodium 138 135 - 146 mmol/L   Potassium 4.0 3.5 - 5.3 mmol/L   Chloride 102 98 - 110 mmol/L   CO2 25 20 - 31 mmol/L   Glucose, Bld 249 (H) 65 - 99 mg/dL   BUN 12 7 - 25 mg/dL   Creat 1.00 0.50 - 1.10 mg/dL   Total Bilirubin 0.3 0.2 - 1.2 mg/dL   Alkaline Phosphatase 100 33 - 115 U/L   AST 13 10 - 30 U/L   ALT 18 6 - 29 U/L   Total Protein 7.2 6.1 - 8.1 g/dL   Albumin 4.0 3.6 - 5.1 g/dL   Calcium 9.1 8.6 - 10.2 mg/dL  HIV antibody  Result Value Ref Range   HIV 1&2 Ab, 4th Generation NONREACTIVE NONREACTIVE  POCT glucose (manual entry)  Result Value Ref Range   POC Glucose 272 (A) 70 - 99 mg/dl  POCT glycosylated hemoglobin (Hb A1C)  Result Value Ref Range   Hemoglobin A1C 8.3        Assessment & Plan:   1. Type 2 diabetes mellitus with diabetic nephropathy, without long-term current use of insulin ([place])   2. Dyslipidemia   3. Screening for HIV (human immunodeficiency virus)     Orders Placed This Encounter  Procedures  . CBC with Differential/Platelet  . Comprehensive metabolic panel    Order Specific Question:  Has the patient fasted?    Answer:  Yes  . HIV antibody  . POCT glucose (manual entry)  . POCT glycosylated hemoglobin (Hb A1C)   No orders of the defined types were placed in this encounter.    Return in about 4 months (around 06/19/2015) for recheck diabetes.    Llana Deshazo Elayne Guerin, M.D. [facility] [address] [place], [place]  [address] [phone] phone [phone] fax

## 2015-02-19 NOTE — Patient Instructions (Addendum)
[email or web address]      Why follow it? Research shows. . Those who follow the Mediterranean diet have a reduced risk of heart disease  . The diet is associated with a reduced incidence of Parkinson's and Alzheimer's diseases . People following the diet may have longer life expectancies and lower rates of chronic diseases  . The Dietary Guidelines for Americans recommends the Mediterranean diet as an eating plan to promote health and prevent disease  What Is the Mediterranean Diet?  . Healthy eating plan based on typical foods and recipes of Mediterranean-style cooking . The diet is primarily a plant based diet; these foods should make up a majority of meals   Starches - Plant based foods should make up a majority of meals - They are an important sources of vitamins, minerals, energy, antioxidants, and fiber - Choose whole grains, foods high in fiber and minimally processed items  - Typical grain sources include wheat, oats, barley, corn, brown rice, bulgar, farro, millet, polenta, couscous  - Various types of beans include chickpeas, lentils, fava beans, black beans, white beans   Fruits  Veggies - Large quantities of antioxidant rich fruits & veggies; 6 or more servings  - Vegetables can be eaten raw or lightly drizzled with oil and cooked  - Vegetables common to the traditional Mediterranean Diet include: artichokes, arugula, beets, broccoli, brussel sprouts, cabbage, carrots, celery, collard greens, cucumbers, eggplant, kale, leeks, lemons, lettuce, mushrooms, okra, onions, peas, peppers, potatoes, pumpkin, radishes, rutabaga, shallots, spinach, sweet potatoes, turnips, zucchini - Fruits common to the Mediterranean Diet include: apples, apricots, avocados, cherries, clementines, dates, figs, grapefruits, grapes, melons, nectarines, oranges, peaches, pears, pomegranates, strawberries, tangerines  Fats - Replace butter and margarine with healthy oils, such as olive oil, canola oil, and tahini   - Limit nuts to no more than a handful a day  - Nuts include walnuts, almonds, pecans, pistachios, pine nuts  - Limit or avoid candied, honey roasted or heavily salted nuts - Olives are central to the [employer] - can be eaten whole or used in a variety of dishes   Meats Protein - Limiting red meat: no more than a few times a month - When eating red meat: choose lean cuts and keep the portion to the size of deck of cards - Eggs: approx. 0 to 4 times a week  - Fish and lean poultry: at least 2 a week  - Healthy protein sources include, chicken, [place], lean beef, lamb - Increase intake of seafood such as tuna, salmon, trout, mackerel, shrimp, scallops - Avoid or limit high fat processed meats such as sausage and bacon  Dairy - Include moderate amounts of low fat dairy products  - Focus on healthy dairy such as fat free yogurt, skim milk, low or reduced fat cheese - Limit dairy products higher in fat such as whole or 2% milk, cheese, ice cream  Alcohol - Moderate amounts of red wine is ok  - No more than 5 oz daily for women (all ages) and men older than age 61  - No more than 10 oz of wine daily for men younger than 83  Other - Limit sweets and other desserts  - Use herbs and spices instead of salt to flavor foods  - Herbs and spices common to the traditional Mediterranean Diet include: basil, bay leaves, chives, cloves, cumin, fennel, garlic, lavender, marjoram, mint, oregano, parsley, pepper, rosemary, sage, savory, sumac, tarragon, thyme   It's not just a diet, it's a lifestyle:  .  The Mediterranean diet includes lifestyle factors typical of those in the region  . Foods, drinks and meals are best eaten with others and savored . Daily physical activity is important for overall good health . This could be strenuous exercise like running and aerobics . This could also be more leisurely activities such as walking, housework, yard-work, or taking the stairs . Moderation is the key;  a balanced and healthy diet accommodates most foods and drinks . Consider portion sizes and frequency of consumption of certain foods   Meal Ideas & Options:  . Breakfast:  o Whole wheat toast or whole wheat English muffins with peanut butter & hard boiled egg o Steel cut oats topped with apples & cinnamon and skim milk  o Fresh fruit: banana, strawberries, melon, berries, peaches  o Smoothies: strawberries, bananas, greek yogurt, peanut butter o Low fat greek yogurt with blueberries and granola  o Egg white omelet with spinach and mushrooms o Breakfast couscous: whole wheat couscous, apricots, skim milk, cranberries  . Sandwiches:  o Hummus and grilled vegetables (peppers, zucchini, squash) on whole wheat bread   o Grilled chicken on whole wheat pita with lettuce, tomatoes, cucumbers or tzatziki  o Tuna salad on whole wheat bread: tuna salad made with greek yogurt, olives, red peppers, capers, green onions o Garlic rosemary lamb pita: lamb sauted with garlic, rosemary, salt & pepper; add lettuce, cucumber, greek yogurt to pita - flavor with lemon juice and black pepper  . Seafood:  o Mediterranean grilled salmon, seasoned with garlic, basil, parsley, lemon juice and black pepper o Shrimp, lemon, and spinach whole-grain pasta salad made with low fat greek yogurt  o Seared scallops with lemon orzo  o Seared tuna steaks seasoned salt, pepper, coriander topped with tomato mixture of olives, tomatoes, olive oil, minced garlic, parsley, green onions and cappers  . Meats:  o Herbed greek chicken salad with kalamata olives, cucumber, feta  o Red bell peppers stuffed with spinach, bulgur, lean ground beef (or lentils) & topped with feta   o Kebabs: skewers of chicken, tomatoes, onions, zucchini, squash  o [place] burgers: made with red onions, mint, dill, lemon juice, feta cheese topped with roasted red peppers . Vegetarian o Cucumber salad: cucumbers, artichoke hearts, celery, red onion, feta  cheese, tossed in olive oil & lemon juice  o Hummus and whole grain pita points with a greek salad (lettuce, tomato, feta, olives, cucumbers, red onion) o Lentil soup with celery, carrots made with vegetable broth, garlic, salt and pepper  o Tabouli salad: parsley, bulgur, mint, scallions, cucumbers, tomato, radishes, lemon juice, olive oil, salt and pepper.

## 2015-06-24 ENCOUNTER — Ambulatory Visit: Payer: BLUE CROSS/BLUE SHIELD | Admitting: Family Medicine

## 2015-09-16 ENCOUNTER — Ambulatory Visit (INDEPENDENT_AMBULATORY_CARE_PROVIDER_SITE_OTHER): Payer: BLUE CROSS/BLUE SHIELD | Admitting: Physician Assistant

## 2015-09-16 ENCOUNTER — Encounter: Payer: Self-pay | Admitting: Physician Assistant

## 2015-09-16 VITALS — BP 122/80 | HR 85 | Temp 98.2°F | Resp 18 | Ht 67.0 in | Wt 277.0 lb

## 2015-09-16 DIAGNOSIS — Z23 Encounter for immunization: Secondary | ICD-10-CM | POA: Diagnosis not present

## 2015-09-16 DIAGNOSIS — M62838 Other muscle spasm: Secondary | ICD-10-CM

## 2015-09-16 DIAGNOSIS — E118 Type 2 diabetes mellitus with unspecified complications: Secondary | ICD-10-CM

## 2015-09-16 DIAGNOSIS — Z1239 Encounter for other screening for malignant neoplasm of breast: Secondary | ICD-10-CM | POA: Diagnosis not present

## 2015-09-16 DIAGNOSIS — B351 Tinea unguium: Secondary | ICD-10-CM

## 2015-09-16 DIAGNOSIS — Z794 Long term (current) use of insulin: Secondary | ICD-10-CM | POA: Diagnosis not present

## 2015-09-16 DIAGNOSIS — E1165 Type 2 diabetes mellitus with hyperglycemia: Secondary | ICD-10-CM

## 2015-09-16 DIAGNOSIS — Z0000 Encounter for general adult medical examination without abnormal findings: Secondary | ICD-10-CM

## 2015-09-16 DIAGNOSIS — M6248 Contracture of muscle, other site: Secondary | ICD-10-CM

## 2015-09-16 DIAGNOSIS — L298 Other pruritus: Secondary | ICD-10-CM

## 2015-09-16 DIAGNOSIS — N898 Other specified noninflammatory disorders of vagina: Secondary | ICD-10-CM

## 2015-09-16 DIAGNOSIS — E669 Obesity, unspecified: Secondary | ICD-10-CM | POA: Diagnosis not present

## 2015-09-16 DIAGNOSIS — M542 Cervicalgia: Secondary | ICD-10-CM

## 2015-09-16 DIAGNOSIS — IMO0002 Reserved for concepts with insufficient information to code with codable children: Secondary | ICD-10-CM

## 2015-09-16 LAB — POCT CBC
Granulocyte percent: 61.7 %G (ref 37–80)
HCT, POC: 40 % (ref 37.7–47.9)
Hemoglobin: 14 g/dL (ref 12.2–16.2)
Lymph, poc: 2.3 (ref 0.6–3.4)
MCH, POC: 29.1 pg (ref 27–31.2)
MCHC: 35 g/dL (ref 31.8–35.4)
MCV: 83.2 fL (ref 80–97)
MID (cbc): 0.4 (ref 0–0.9)
MPV: 7.9 fL (ref 0–99.8)
POC Granulocyte: 4.4 (ref 2–6.9)
POC LYMPH PERCENT: 32.4 % (ref 10–50)
POC MID %: 5.9 % (ref 0–12)
Platelet Count, POC: 322 10*3/uL (ref 142–424)
RBC: 4.81 M/uL (ref 4.04–5.48)
RDW, POC: 13.7 %
WBC: 7.2 10*3/uL (ref 4.6–10.2)

## 2015-09-16 LAB — COMPLETE METABOLIC PANEL WITHOUT GFR
Albumin: 4.1 g/dL (ref 3.6–5.1)
Alkaline Phosphatase: 103 U/L (ref 33–115)
GFR, Est African American: >89 mL/min (ref >=60)
GFR, Est Non African American: >89 mL/min (ref >=60)
Glucose, Bld: 399 mg/dL — ABNORMAL HIGH (ref 65–99)
Total Bilirubin: 0.4 mg/dL (ref 0.2–1.2)
Total Protein: 7.4 g/dL (ref 6.1–8.1)

## 2015-09-16 LAB — COMPLETE METABOLIC PANEL WITH GFR
ALT: 21 U/L (ref 6–29)
AST: 14 U/L (ref 10–30)
BUN: 9 mg/dL (ref 7–25)
CO2: 26 mmol/L (ref 20–31)
Calcium: 9.4 mg/dL (ref 8.6–10.2)
Chloride: 99 mmol/L (ref 98–110)
Creat: 0.65 mg/dL (ref 0.50–1.10)
Potassium: 4.4 mmol/L (ref 3.5–5.3)
Sodium: 135 mmol/L (ref 135–146)

## 2015-09-16 LAB — POCT GLYCOSYLATED HEMOGLOBIN (HGB A1C): Hemoglobin A1C: 9.5

## 2015-09-16 LAB — GLUCOSE, POCT (MANUAL RESULT ENTRY): POC Glucose: 424 mg/dl — AB (ref 70–99)

## 2015-09-16 LAB — LIPID PANEL
Cholesterol: 154 mg/dL (ref 125–200)
HDL: 28 mg/dL — ABNORMAL LOW (ref >=46)
LDL Cholesterol: 71 mg/dL (ref <130)
Total CHOL/HDL Ratio: 5.5 Ratio — ABNORMAL HIGH (ref <=5.0)
Triglycerides: 276 mg/dL — ABNORMAL HIGH (ref <150)
VLDL: 55 mg/dL — ABNORMAL HIGH (ref <30)

## 2015-09-16 MED ORDER — FLUCONAZOLE 150 MG PO TABS: 150.0000 mg | ORAL_TABLET | Freq: Once | ORAL | 0 refills | Status: DC

## 2015-09-16 MED ORDER — TERBINAFINE HCL 250 MG PO TABS
250.0000 mg | ORAL_TABLET | Freq: Every day | ORAL | 0 refills | Status: DC
Start: 2015-09-16 — End: 2016-09-15

## 2015-09-16 MED ORDER — CYCLOBENZAPRINE HCL 5 MG PO TABS: ORAL_TABLET | ORAL | 5 refills | Status: DC

## 2015-09-16 MED ORDER — METFORMIN HCL 500 MG PO TABS: 500.0000 mg | ORAL_TABLET | Freq: Two times a day (BID) | ORAL | 1 refills | Status: DC

## 2015-09-16 NOTE — Patient Instructions (Addendum)
We recommend that you schedule a mammogram for breast cancer screening. Typically, you do not need a referral to do this. Please contact a local imaging center to schedule your mammogram.  [facility] - [phone]  *ask for the Radiology Department [facility]) - [phone] or [phone]  [facility] - [phone] [place] [phone] [facility] [address] - [phone]  *ask for the [facility] - [phone]  *ask for the Radiology Department [facility] - [phone]  *ask for the [facility] - [phone]     IF you received an x-ray today, you will receive an invoice from [facility]. Please contact [facility] at [phone] with questions or concerns regarding your invoice.   IF you received labwork today, you will receive an invoice from [employer]. Please contact [facility] at [phone] with questions or concerns regarding your invoice.   Our billing staff will not be able to assist you with questions regarding bills from these companies.  You will be contacted with the lab results as soon as they are available. The fastest way to get your results is to activate your My Chart account. Instructions are located on the last page of this paperwork. If you have not heard from [place] regarding the results in 2 weeks, please contact this office.    Health Maintenance, Female Adopting a healthy lifestyle and getting preventive care can go a long way to promote health and wellness. Talk with your health care provider about what schedule of regular examinations is right for you. This is a good chance for you to check in with your provider about disease prevention and staying healthy. In between checkups, there are plenty of things you can do on your own. Experts have done a lot of research  about which lifestyle changes and preventive measures are most likely to keep you healthy. Ask your health care provider for more information. WEIGHT AND DIET  Eat a healthy diet  Be sure to include plenty of vegetables, fruits, low-fat dairy products, and lean protein.  Do not eat a lot of foods high in solid fats, added sugars, or salt.  Get regular exercise. This is one of the most important things you can do for your health.  Most adults should exercise for at least 150 minutes each week. The exercise should increase your heart rate and make you sweat (moderate-intensity exercise).  Most adults should also do strengthening exercises at least twice a week. This is in addition to the moderate-intensity exercise.  Maintain a healthy weight  Body mass index (BMI) is a measurement that can be used to identify possible weight problems. It estimates body fat based on height and weight. Your health care provider can help determine your BMI and help you achieve or maintain a healthy weight.  For females 33 years of age and older:   A BMI below 18.5 is considered underweight.  A BMI of 18.5 to 24.9 is normal.  A BMI of 25 to 29.9 is considered overweight.  A BMI of 30 and above is considered obese.  Watch levels of cholesterol and blood lipids  You should start having your blood tested for lipids and cholesterol at 41 years of age, then have this test every 5 years.  You may need to have your cholesterol levels checked more often if:  Your lipid or cholesterol levels are high.  You  are older than 41 years of age.  You are at high risk for heart disease.  CANCER SCREENING   Lung Cancer  Lung cancer screening is recommended for adults 29-63 years old who are at high risk for lung cancer because of a history of smoking.  A yearly low-dose CT scan of the lungs is recommended for people who:  Currently smoke.  Have quit within the past 15 years.  Have at least a  30-pack-year history of smoking. A pack year is smoking an average of one pack of cigarettes a day for 1 year.  Yearly screening should continue until it has been 15 years since you quit.  Yearly screening should stop if you develop a health problem that would prevent you from having lung cancer treatment.  Breast Cancer  Practice breast self-awareness. This means understanding how your breasts normally appear and feel.  It also means doing regular breast self-exams. Let your health care provider know about any changes, no matter how small.  If you are in your 20s or 30s, you should have a clinical breast exam (CBE) by a health care provider every 1-3 years as part of a regular health exam.  If you are 73 or older, have a CBE every year. Also consider having a breast X-ray (mammogram) every year.  If you have a family history of breast cancer, talk to your health care provider about genetic screening.  If you are at high risk for breast cancer, talk to your health care provider about having an MRI and a mammogram every year.  Breast cancer gene (BRCA) assessment is recommended for women who have family members with BRCA-related cancers. BRCA-related cancers include:  Breast.  Ovarian.  Tubal.  Peritoneal cancers.  Results of the assessment will determine the need for genetic counseling and BRCA1 and BRCA2 testing. Cervical Cancer Your health care provider may recommend that you be screened regularly for cancer of the pelvic organs (ovaries, uterus, and vagina). This screening involves a pelvic examination, including checking for microscopic changes to the surface of your cervix (Pap test). You may be encouraged to have this screening done every 3 years, beginning at age 54.  For women ages 37-65, health care providers may recommend pelvic exams and Pap testing every 3 years, or they may recommend the Pap and pelvic exam, combined with testing for human papilloma virus (HPV), every 5  years. Some types of HPV increase your risk of cervical cancer. Testing for HPV may also be done on women of any age with unclear Pap test results.  Other health care providers may not recommend any screening for nonpregnant women who are considered low risk for pelvic cancer and who do not have symptoms. Ask your health care provider if a screening pelvic exam is right for you.  If you have had past treatment for cervical cancer or a condition that could lead to cancer, you need Pap tests and screening for cancer for at least 20 years after your treatment. If Pap tests have been discontinued, your risk factors (such as having a new sexual partner) need to be reassessed to determine if screening should resume. Some women have medical problems that increase the chance of getting cervical cancer. In these cases, your health care provider may recommend more frequent screening and Pap tests. Colorectal Cancer  This type of cancer can be detected and often prevented.  Routine colorectal cancer screening usually begins at 41 years of age and continues through 41 years of age.  Your health care provider may recommend screening at an earlier age if you have risk factors for colon cancer.  Your health care provider may also recommend using home test kits to check for hidden blood in the stool.  A small camera at the end of a tube can be used to examine your colon directly (sigmoidoscopy or colonoscopy). This is done to check for the earliest forms of colorectal cancer.  Routine screening usually begins at age 65.  Direct examination of the colon should be repeated every 5-10 years through 41 years of age. However, you may need to be screened more often if early forms of precancerous polyps or small growths are found. Skin Cancer  Check your skin from head to toe regularly.  Tell your health care provider about any new moles or changes in moles, especially if there is a change in a mole's shape or  color.  Also tell your health care provider if you have a mole that is larger than the size of a pencil eraser.  Always use sunscreen. Apply sunscreen liberally and repeatedly throughout the day.  Protect yourself by wearing long sleeves, pants, a wide-brimmed hat, and sunglasses whenever you are outside. HEART DISEASE, DIABETES, AND HIGH BLOOD PRESSURE   High blood pressure causes heart disease and increases the risk of stroke. High blood pressure is more likely to develop in:  People who have blood pressure in the high end of the normal range (130-139/85-89 mm Hg).  People who are overweight or obese.  People who are African American.  If you are 70-36 years of age, have your blood pressure checked every 3-5 years. If you are 47 years of age or older, have your blood pressure checked every year. You should have your blood pressure measured twice--once when you are at a hospital or clinic, and once when you are not at a hospital or clinic. Record the average of the two measurements. To check your blood pressure when you are not at a hospital or clinic, you can use:  An automated blood pressure machine at a pharmacy.  A home blood pressure monitor.  If you are between 64 years and 14 years old, ask your health care provider if you should take aspirin to prevent strokes.  Have regular diabetes screenings. This involves taking a blood sample to check your fasting blood sugar level.  If you are at a normal weight and have a low risk for diabetes, have this test once every three years after 41 years of age.  If you are overweight and have a high risk for diabetes, consider being tested at a younger age or more often. PREVENTING INFECTION  Hepatitis B  If you have a higher risk for hepatitis B, you should be screened for this virus. You are considered at high risk for hepatitis B if:  You were born in a country where hepatitis B is common. Ask your health care provider which countries  are considered high risk.  Your parents were born in a high-risk country, and you have not been immunized against hepatitis B (hepatitis B vaccine).  You have HIV or AIDS.  You use needles to inject street drugs.  You live with someone who has hepatitis B.  You have had sex with someone who has hepatitis B.  You get hemodialysis treatment.  You take certain medicines for conditions, including cancer, organ transplantation, and autoimmune conditions. Hepatitis C  Blood testing is recommended for:  Everyone born from [birth year] through 1966.  Anyone with known risk factors for hepatitis C. Sexually transmitted infections (STIs)  You should be screened for sexually transmitted infections (STIs) including gonorrhea and chlamydia if:  You are sexually active and are younger than 41 years of age.  You are older than 41 years of age and your health care provider tells you that you are at risk for this type of infection.  Your sexual activity has changed since you were last screened and you are at an increased risk for chlamydia or gonorrhea. Ask your health care provider if you are at risk.  If you do not have HIV, but are at risk, it may be recommended that you take a prescription medicine daily to prevent HIV infection. This is called pre-exposure prophylaxis (PrEP). You are considered at risk if:  You are sexually active and do not regularly use condoms or know the HIV status of your partner(s).  You take drugs by injection.  You are sexually active with a partner who has HIV. Talk with your health care provider about whether you are at high risk of being infected with HIV. If you choose to begin PrEP, you should first be tested for HIV. You should then be tested every 3 months for as long as you are taking PrEP.  PREGNANCY   If you are premenopausal and you may become pregnant, ask your health care provider about preconception counseling.  If you may become pregnant, take 400 to  800 micrograms (mcg) of folic acid every day.  If you want to prevent pregnancy, talk to your health care provider about birth control (contraception). OSTEOPOROSIS AND MENOPAUSE   Osteoporosis is a disease in which the bones lose minerals and strength with aging. This can result in serious bone fractures. Your risk for osteoporosis can be identified using a bone density scan.  If you are 56 years of age or older, or if you are at risk for osteoporosis and fractures, ask your health care provider if you should be screened.  Ask your health care provider whether you should take a calcium or vitamin D supplement to lower your risk for osteoporosis.  Menopause may have certain physical symptoms and risks.  Hormone replacement therapy may reduce some of these symptoms and risks. Talk to your health care provider about whether hormone replacement therapy is right for you.  HOME CARE INSTRUCTIONS   Schedule regular health, dental, and eye exams.  Stay current with your immunizations.   Do not use any tobacco products including cigarettes, chewing tobacco, or electronic cigarettes.  If you are pregnant, do not drink alcohol.  If you are breastfeeding, limit how much and how often you drink alcohol.  Limit alcohol intake to no more than 1 drink per day for nonpregnant women. One drink equals 12 ounces of beer, 5 ounces of wine, or 1 ounces of hard liquor.  Do not use street drugs.  Do not share needles.  Ask your health care provider for help if you need support or information about quitting drugs.  Tell your health care provider if you often feel depressed.  Tell your health care provider if you have ever been abused or do not feel safe at home.   This information is not intended to replace advice given to you by your health care provider. Make sure you discuss any questions you have with your health care provider.   Document Released: 07/06/2010 Document Revised: 01/11/2014  Document Reviewed: 11/22/2012 Elsevier Interactive Patient Education [employer].

## 2015-09-16 NOTE — Progress Notes (Signed)
Kim Delgado  MRN: [id number] DOB: 1975/10/04  PCP: Lamar Blinks, MD  Subjective:  Pt is a 40 year old female, history of diabetes, hypertension and obesity, presents to clinic for annual physical. Feeling mostly well today.   Diabetes x 5 years. Takes Metformin 500mg  1-2 x day. Needs refill. Mostly compliant. Misses a dose occasionally because she forgets. No side effects. She is not willing to increase dose or add additional medications, regardless of her lab results today.  Works third shift as a Marine scientist. She has a hard time keeping good diet and regular exercise with third shift.   Hypertension - Controlled with Lisinopril 5 mg.   Left-sided neck pain- Suffering from neck pain for several months. She has occasional muscle spasms in neck. No MOI. Difficulty sleeping, can only get comfortable sleeping upright. Does not radiate down arm. Radiates to left shoulder blade. Flexeril help ease the pain. Takes it a few times a week. Needs refill  Toe nail fungus - Chronic. Takes Lamisil. Needs refill  Vagina: Complains of itching burning vagina. No discharge. Recent antibiotic use for finger infection. She has gotten the same symptoms after antibiotic use in the past.  LMP: None hysterotomy.  Eye exam: No eye exam yet. Called yesterday, is covered under insurance. Plans to go this afternoon.  Mammogram: Last immaging 4 years ago. Still has not scheduled an appointment, Dr. Brigitte Pulse referred her at last visit over a year ago. Complains of occasional shooting pain in right breast every few weeks. Does not radiate.    Not received flu shot.   Review of Systems  Cardiovascular: Negative.   Gastrointestinal: Negative.   Genitourinary: Positive for vaginal pain (burning, itching). Negative for vaginal discharge.  Musculoskeletal: Positive for myalgias (left shoulder pain).    Patient Active Problem List   Diagnosis Date Noted  . 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 Severe obesity (BMI >= 40)  ([place]) 08/06/2013  . Acute bronchitis 08/06/2013  . Internal hemorrhoids 06/23/2012  . Diabetes ([place]) 03/16/2012  . BMI 40.0-44.9, adult ([place]) 10/19/2011  . Nicotine addiction 10/19/2011  . Rectal bleeding 12/23/2010    Current Outpatient Prescriptions on File Prior to Visit  Medication Sig Dispense Refill  . cyclobenzaprine (FLEXERIL) 5 MG tablet 1 pill by mouth up to every 8 hours as needed. Start with one pill by mouth each bedtime as needed due to sedation 30 tablet 5  . metFORMIN (GLUCOPHAGE) 500 MG tablet Take 1 tablet (500 mg total) by mouth 2 (two) times daily with a meal. 180 tablet 1  . Ascorbic Acid (VITAMIN C PO) Take by mouth. Will take three times a week     . Calcium Carbonate-Vitamin D (CALCIUM PLUS VITAMIN D PO) Take 500 mg by mouth 3 (three) times a week.      Marland Kitchen lisinopril (PRINIVIL,ZESTRIL) 5 MG tablet Take 1 tablet (5 mg total) by mouth daily. (Patient not taking: Reported on 02/19/2015) 30 tablet 5  . terbinafine (LAMISIL) 250 MG tablet Take 250 mg by mouth daily. Reported on 02/19/2015     No current facility-administered medications on file prior to visit.     No Known Allergies  Objective:  BP 122/80 (BP Location: Right Arm, Patient Position: Sitting, Cuff Size: Large)   Pulse 85   Temp 98.2 F (36.8 C) (Oral)   Resp 18   Ht 5\' 7"  (1.702 m)   Wt 277 lb (125.6 kg)   SpO2 98%   BMI 43.38 kg/m   Physical Exam  Constitutional: She is oriented to person, place, and time and well-developed, well-nourished, and in no distress. No distress.  HENT:  Head: Normocephalic and atraumatic.  Eyes: Conjunctivae are normal. Pupils are equal, round, and reactive to light.  Neck: Normal range of motion. Neck supple.  Cardiovascular: Normal rate, regular rhythm and normal heart sounds.   Pulmonary/Chest: Effort normal and breath sounds normal. No respiratory distress. She has no wheezes. She exhibits no tenderness.  Abdominal: Soft. She exhibits no distension and no mass.  There is no tenderness.  Musculoskeletal: She exhibits no edema.  TTP Paraspinous muscles and trapezius to left side of neck. No sensory deficits appreciated. 5/5 strength b/l arms and grip strength. No bony tenderness c-spine.  Lymphadenopathy:    She has no cervical adenopathy.  Neurological: She is alert and oriented to person, place, and time. GCS score is 15.  Skin: Skin is warm and dry.  Psychiatric: Mood, memory, affect and judgment normal.  Vitals reviewed.  Assessment and Plan :  1. Physical exam - POCT CBC - COMPLETE METABOLIC PANEL WITH GFR - Encouraged patient to work hard on losing weight, maintaining a healthy diet and getting regular exercise. She says she wants to be a positive role model for her daughter, who is also overweight. She states she does not want to add any medications or increase current dose regardless of her lab results.  She has been to diabetic nutritionist in the past, says she already knew everything they went over. She needs to be better about putting it all into practice.   2. Uncontrolled type 2 diabetes mellitus with complication, with long-term current use of insulin (HCC) - POCT glucose (manual entry) - POCT glycosylated hemoglobin (Hb A1C) - metFORMIN (GLUCOPHAGE) 500 MG tablet; Take 1 tablet (500 mg total) by mouth 2 (two) times daily with a meal.  Dispense: 180 tablet; Refill: 1  3. Muscle spasms of neck 4. Muscle pain, cervical - cyclobenzaprine (FLEXERIL) 5 MG tablet; 1 pill by mouth up to every 8 hours as needed. Start with one pill by mouth each bedtime as needed due to sedation  Dispense: 30 tablet; Refill: 5  5. Onychomycosis - terbinafine (LAMISIL) 250 MG tablet; Take 1 tablet (250 mg total) by mouth daily. Reported on 02/19/2015  Dispense: 90 tablet; Refill: 0  6. Obesity - Lipid panel  7. Vaginal itching - fluconazole (DIFLUCAN) 150 MG tablet; Take 1 tablet (150 mg total) by mouth once. Repeat if needed  Dispense: 2 tablet; Refill:  0  8. Screening for breast cancer  9. Needs flu shot - Flu Vaccine QUAD 36+ mos IM   Mercer Pod, PA-C  [facility] 09/16/2015 8:22 AM

## 2015-09-22 NOTE — Progress Notes (Signed)
Please let Kim Delgado know her A1C is 9.5 and blood sugar is 399. The lipid panel shows her cholesterol is not controlled as well. Over the past year, your lipid levels have gotten considerably worse. I know she wants to control her blood sugar and lipids with lifestyle modifications, but that might not be enough. Along with diet and exercise, you may need a statin to help this process. Please consider returning to discuss options to get these levels under control... or I can call in a statin prescription for her if she prefers.  Thank you.

## 2015-09-25 ENCOUNTER — Other Ambulatory Visit: Payer: Self-pay | Admitting: Physician Assistant

## 2015-09-25 DIAGNOSIS — E785 Hyperlipidemia, unspecified: Secondary | ICD-10-CM

## 2015-09-25 DIAGNOSIS — E1169 Type 2 diabetes mellitus with other specified complication: Secondary | ICD-10-CM | POA: Insufficient documentation

## 2015-09-25 MED ORDER — ATORVASTATIN CALCIUM 10 MG PO TABS: 10.0000 mg | ORAL_TABLET | Freq: Every day | ORAL | 3 refills | Status: DC

## 2015-09-25 NOTE — Progress Notes (Signed)
Will start patient on Lipitor 10mg  q daily. I will encourage her to follow-up in one month for lipid panel recheck.  She refuses to increase dose of Metformin - 500mg  BID. Should discuss adding another medication.

## 2015-12-04 ENCOUNTER — Telehealth: Payer: Self-pay

## 2015-12-04 DIAGNOSIS — N898 Other specified noninflammatory disorders of vagina: Secondary | ICD-10-CM

## 2015-12-04 NOTE — Telephone Encounter (Signed)
Pt is needing a diflucan called into the pharmacy at [facility] number [phone]

## 2015-12-05 ENCOUNTER — Telehealth: Payer: Self-pay

## 2015-12-05 NOTE — Telephone Encounter (Signed)
lmtcb

## 2015-12-05 NOTE — Telephone Encounter (Signed)
Patient is returning Karen's call.  Please advise  [phone]

## 2015-12-05 NOTE — Telephone Encounter (Signed)
Patient returned phone call. °

## 2015-12-09 NOTE — Telephone Encounter (Signed)
Pt. States she gets frequent yeast due to diabetes and gets diflucan rx when needed. She admits she is not good about using otc cream to use and oral works better She did get otc azo for yeast and thought maybe try that? Or rx diflucan? Please advise.

## 2015-12-10 MED ORDER — FLUCONAZOLE 150 MG PO TABS: 150.0000 mg | ORAL_TABLET | Freq: Once | ORAL | 0 refills | Status: AC

## 2015-12-10 NOTE — Telephone Encounter (Signed)
Pt did have that filled but that was in Sept. She is having a new yeast infection now.

## 2015-12-10 NOTE — Telephone Encounter (Addendum)
Sent in Rx and notified pt on Vm.

## 2015-12-10 NOTE — Telephone Encounter (Signed)
I wrote a prescription at her last visit for Diflucan. Did she fill this?

## 2015-12-10 NOTE — Telephone Encounter (Signed)
OK to fill Diflucan

## 2016-09-15 ENCOUNTER — Ambulatory Visit (INDEPENDENT_AMBULATORY_CARE_PROVIDER_SITE_OTHER): Payer: 59 | Admitting: Physician Assistant

## 2016-09-15 ENCOUNTER — Encounter: Payer: Self-pay | Admitting: Physician Assistant

## 2016-09-15 ENCOUNTER — Telehealth: Payer: Self-pay

## 2016-09-15 VITALS — BP 127/83 | HR 88 | Temp 98.0°F | Resp 16 | Ht 67.72 in | Wt 268.0 lb

## 2016-09-15 DIAGNOSIS — N760 Acute vaginitis: Secondary | ICD-10-CM | POA: Diagnosis not present

## 2016-09-15 DIAGNOSIS — Z794 Long term (current) use of insulin: Secondary | ICD-10-CM

## 2016-09-15 DIAGNOSIS — M62838 Other muscle spasm: Secondary | ICD-10-CM | POA: Diagnosis not present

## 2016-09-15 DIAGNOSIS — Z0000 Encounter for general adult medical examination without abnormal findings: Secondary | ICD-10-CM | POA: Diagnosis not present

## 2016-09-15 DIAGNOSIS — E1165 Type 2 diabetes mellitus with hyperglycemia: Secondary | ICD-10-CM

## 2016-09-15 DIAGNOSIS — E1121 Type 2 diabetes mellitus with diabetic nephropathy: Secondary | ICD-10-CM

## 2016-09-15 DIAGNOSIS — E119 Type 2 diabetes mellitus without complications: Secondary | ICD-10-CM

## 2016-09-15 DIAGNOSIS — Z1329 Encounter for screening for other suspected endocrine disorder: Secondary | ICD-10-CM | POA: Diagnosis not present

## 2016-09-15 DIAGNOSIS — IMO0002 Reserved for concepts with insufficient information to code with codable children: Secondary | ICD-10-CM

## 2016-09-15 DIAGNOSIS — Z130 Encounter for screening for diseases of the blood and blood-forming organs and certain disorders involving the immune mechanism: Secondary | ICD-10-CM

## 2016-09-15 DIAGNOSIS — E118 Type 2 diabetes mellitus with unspecified complications: Secondary | ICD-10-CM | POA: Diagnosis not present

## 2016-09-15 DIAGNOSIS — E785 Hyperlipidemia, unspecified: Secondary | ICD-10-CM | POA: Diagnosis not present

## 2016-09-15 LAB — POCT URINALYSIS DIP (MANUAL ENTRY)
Bilirubin, UA: NEGATIVE
Glucose, UA: >=1000 mg/dL — AB
Ketones, POC UA: NEGATIVE mg/dL
Leukocytes, UA: NEGATIVE
Nitrite, UA: NEGATIVE
Protein Ur, POC: NEGATIVE mg/dL
Spec Grav, UA: 1.015 (ref 1.010–1.025)
Urobilinogen, UA: 0.2 E.U./dL
pH, UA: 7 (ref 5.0–8.0)

## 2016-09-15 LAB — GLUCOSE, POCT (MANUAL RESULT ENTRY): POC Glucose: 260 mg/dL — AB (ref 70–99)

## 2016-09-15 LAB — POCT GLYCOSYLATED HEMOGLOBIN (HGB A1C): Hemoglobin A1C: 10.6

## 2016-09-15 MED ORDER — CYCLOBENZAPRINE HCL 5 MG PO TABS: ORAL_TABLET | ORAL | 5 refills | Status: DC

## 2016-09-15 MED ORDER — GHT BLOOD GLUCOSE MONITOR W/DEVICE KIT: 1.0000 | PACK | Freq: Two times a day (BID) | 0 refills | Status: DC

## 2016-09-15 MED ORDER — METFORMIN HCL ER (MOD) 500 MG PO TB24: 500.0000 mg | ORAL_TABLET | Freq: Every day | ORAL | 4 refills | Status: DC

## 2016-09-15 MED ORDER — FLUCONAZOLE 150 MG PO TABS: 150.0000 mg | ORAL_TABLET | Freq: Once | ORAL | 0 refills | Status: DC

## 2016-09-15 MED ORDER — FLUCONAZOLE 150 MG PO TABS: 150.0000 mg | ORAL_TABLET | Freq: Once | ORAL | 0 refills | Status: AC

## 2016-09-15 NOTE — Progress Notes (Signed)
[facility] at [facility], [place] [address] [phone]  Date:  09/15/2016   Name:  Kim Delgado   DOB:  08/05/1975   MRN:  [id number]  PCP:  Darreld Mclean, MD    Chief Complaint: Annual Exam   History of Present Illness:  This is a 41 y.o. female with PMH DM, HTN, obesity, HLD who is presenting for CPE. 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 "I only work and sleep".   DM - Metformin 563m bid. Has been out of Metformin x a few months. She refuses to take a higher dose of this medication. Last A1C was 9.5 one year ago.  She is not fasting today. Last meal was 2 hours ago.   Complaints: "hard spot" on vagina and yeast infection. Muscle spasm of neck - this is a chronic problem for her. She would like a refill of flexeril.  Contraception: none Last pap: due 06/2017 Sexual history: She is married. Has not had intercourse since 02/2016.  Immunizations: UTD Eye: Does not get eyes checked.  Diet/Exercise: She is trying to become a vegetarian. Cut down a lot on eating meat. She is not exercising.  Fam hx: Mother - DM, HTN, stroke, TIA; Father - drug abuse; mat aunt - breast cancer; mat aunt (2) - throat cancer; MGM DM and glaucoma.  Tobacco/alcohol/substance use: no smking in 5 years. She drinks alcohol once in a blue moon. No drug use.   Review of Systems:  Review of Systems  Constitutional: Negative for chills, diaphoresis, fatigue and fever.  HENT: Negative for congestion, postnasal drip, rhinorrhea, sinus pressure, sneezing and sore throat.   Respiratory: Negative for cough, chest tightness, shortness of breath and wheezing.   Cardiovascular: Negative for chest pain and palpitations.  Gastrointestinal: Negative for abdominal pain, diarrhea, nausea and vomiting.  Genitourinary: Negative for decreased urine volume, difficulty urinating, dysuria, enuresis, flank pain, frequency, hematuria and urgency.  Musculoskeletal: Negative for back pain.   Neurological: Negative for dizziness, weakness, light-headedness and headaches.    Patient Active Problem List   Diagnosis Date Noted  . Hyperlipidemia 09/25/2015  . 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 Severe obesity (BMI >= 40) ([facility] 08/06/2013  . Acute bronchitis 08/06/2013  . Internal hemorrhoids 06/23/2012  . Diabetes ([facility] 03/16/2012  . BMI 40.0-44.9, adult ([facility] 10/19/2011  . Nicotine addiction 10/19/2011  . Rectal bleeding 12/23/2010    Prior to Admission medications   Medication Sig Start Date End Date Taking? Authorizing Provider  Ascorbic Acid (VITAMIN C PO) Take by mouth. Will take three times a week    Yes [provider]  Calcium Carbonate-Vitamin D (CALCIUM PLUS VITAMIN D PO) Take 500 mg by mouth 3 (three) times a week.     Yes [provider]  cyclobenzaprine (FLEXERIL) 5 MG tablet 1 pill by mouth up to every 8 hours as needed. Start with one pill by mouth each bedtime as needed due to sedation 09/16/15  Yes Lashaundra Lehrmann, EGelene Mink PA-C  metFORMIN (GLUCOPHAGE) 500 MG tablet Take 1 tablet (500 mg total) by mouth 2 (two) times daily with a meal. 09/16/15  Yes Bina Veenstra, EGelene Mink PA-C    No Known Allergies  Past Surgical History:  Procedure Laterality Date  . ABDOMINAL HYSTERECTOMY     BSO; CERVIX INTACT; DUB; fibroids; endometriosis.  [place].  .Marland KitchenBREAST SURGERY    . CYSTOSCOPY    . TUBAL LIGATION      Social  History  Substance Use Topics  . Smoking status: Former Smoker    Packs/day: 0.30    Types: Cigarettes    Quit date: 07/05/2011  . Smokeless tobacco: Never Used     Comment: Counseling sheet given to quit smoking   . Alcohol use No    Family History  Problem Relation Age of Onset  . Transient ischemic attack Mother 1       x 2  . Hypertension Mother   . Diabetes Mother        diet controlled  . Stroke Mother   . Glaucoma Maternal Grandmother   . Diabetes Maternal Grandmother   . Drug abuse Father        iv drug abuse   . Breast cancer Maternal Aunt   . Throat cancer Maternal Aunt   . Lung cancer Paternal Aunt   . Colon cancer Neg Hx     Medication list has been reviewed and updated.  Physical Examination:  Physical Exam  Constitutional: She is oriented to person, place, and time. Vital signs are normal. She appears well-developed and well-nourished. No distress.  obese  HENT:  Head: Normocephalic and atraumatic.  Right Ear: Tympanic membrane normal.  Left Ear: Tympanic membrane normal.  Mouth/Throat: Oropharynx is clear and moist.  No thrush noted  Eyes: Pupils are equal, round, and reactive to light. Conjunctivae and EOM are normal.  Neck: Normal range of motion. Neck supple. No thyromegaly present.  Cardiovascular: Normal rate, regular rhythm and normal heart sounds.   No murmur heard. Pulmonary/Chest: Effort normal and breath sounds normal. She has no wheezes. Right breast exhibits no inverted nipple, no mass and no skin change. Left breast exhibits no inverted nipple, no mass and no skin change.  Abdominal: Soft. Bowel sounds are normal. She exhibits no mass. There is no tenderness. There is no guarding.  Genitourinary: Vagina normal. No breast swelling, tenderness, discharge or bleeding. Pelvic exam was performed with patient prone. There is no tenderness or lesion on the right labia. There is no tenderness or lesion on the left labia.  Musculoskeletal: Normal range of motion.  Neurological: She is alert and oriented to person, place, and time. She has normal reflexes.  Skin: Skin is warm and dry.  Psychiatric: She has a normal mood and affect. Her behavior is normal. Judgment and thought content normal.  Vitals reviewed.  Diabetic Foot Exam - Simple   Simple Foot Form Visual Inspection No deformities, no ulcerations, no other skin breakdown bilaterally:  Yes Sensation Testing Intact to touch and monofilament testing bilaterally:  Yes Pulse Check Posterior Tibialis and Dorsalis pulse  intact bilaterally:  Yes Comments Pt states she does have some numbness sometime when she wakes up in the mornings      BP 127/83   Pulse 88   Temp 98 F (36.7 C) (Oral)   Resp 16   Ht 5' 7.72" (1.72 m)   Wt 268 lb (121.6 kg)   SpO2 98%   BMI 41.09 kg/m  Results for orders placed or performed in visit on 09/15/16  POCT glucose (manual entry)  Result Value Ref Range   POC Glucose 260 (A) 70 - 99 mg/dl  POCT glycosylated hemoglobin (Hb A1C)  Result Value Ref Range   Hemoglobin A1C 10.6   POCT urinalysis dipstick  Result Value Ref Range   Color, UA yellow yellow   Clarity, UA clear clear   Glucose, UA >=1,000 (A) negative mg/dL   Bilirubin, UA negative negative  Ketones, POC UA negative negative mg/dL   Spec Grav, UA 1.015 1.010 - 1.025   Blood, UA trace-intact (A) negative   pH, UA 7.0 5.0 - 8.0   Protein Ur, POC negative negative mg/dL   Urobilinogen, UA 0.2 0.2 or 1.0 E.U./dL   Nitrite, UA Negative Negative   Leukocytes, UA Negative Negative    Assessment and Plan: 1. Annual physical exam - Pt presents for annual exam. She has a h/o medication noncompliance. She refuses to increase her Metformin above 535m bid as "I don't want my body to become dependent on it". Discussed at length with pt complications of uncontrolled DM - A1C today is >10. She refuses to visit endocrinology. She promises to check her sugars regularly and will increase dose of Metformin if needed. She will not come back for lab only to check fasting lipids. She agrees to have this done at her next OV in 3 months.   2. Type 2 diabetes mellitus with diabetic nephropathy, without long-term current use of insulin (HCC) - Comprehensive metabolic panel - POCT glucose (manual entry) - POCT glycosylated hemoglobin (Hb A1C) - POCT urinalysis dipstick - HM Diabetes Foot Exam - Microalbumin, urine - metFORMIN (GLUMETZA) 500 MG (MOD) 24 hr tablet; Take 1 tablet (500 mg total) by mouth daily with breakfast.   Dispense: 90 tablet; Refill: 4  3. Vaginosis - fluconazole (DIFLUCAN) 150 MG tablet; Take 1 tablet (150 mg total) by mouth once. Repeat if needed  Dispense: 2 tablet; Refill: 0  4. Hyperlipidemia, unspecified hyperlipidemia type - She is not fasting today. Check lipids at next OV in 3 months.   5. Muscle spasms of neck - cyclobenzaprine (FLEXERIL) 5 MG tablet; 1 pill by mouth up to every 8 hours as needed. Start with one pill by mouth each bedtime as needed due to sedation  Dispense: 30 tablet; Refill: 5  6. Severe obesity (BMI >= 40) (HCC) - Con't making changes to diet. Start exercising.   7. Screening for deficiency anemia - CBC  8. Screening for thyroid disorder - TSH  9. Uncontrolled type 2 diabetes mellitus with complication, with long-term current use of insulin ([facility] 10. Encounter for DM foot exam - Blood Glucose Monitoring Suppl (GHT BLOOD GLUCOSE MONITOR) w/Device KIT; 1 Device by Does not apply route 2 (two) times daily.  Dispense: 1 kit; Refill: 0   WMercer Pod PA-C  [facility]/12/2016 8:52 AM

## 2016-09-15 NOTE — Patient Instructions (Addendum)
Fill out an Advanced Directive form.  Metformin XR: 500 mg once daily for 7-14 days. Then take 2 pills in the morning for 7-14 days.  If your sugars are still high after 7-10 days at 1,065m, take 3 pills in the morning.  Continue to work hard on improving your diet.  Come back and see me in 2-3 months. Please be fasting (no food for 8 hours prior to your appt).   Thank you for coming in today. I hope you feel we met your needs.  Feel free to call PCP if you have any questions or further requests.  Please consider signing up for MyChart if you do not already have it, as this is a great way to communicate with me.  Best,  Whitney McVey, PA-C  Diabetes Mellitus and Food It is important for you to manage your blood sugar (glucose) level. Your blood glucose level can be greatly affected by what you eat. Eating healthier foods in the appropriate amounts throughout the day at about the same time each day will help you control your blood glucose level. It can also help slow or prevent worsening of your diabetes mellitus. Healthy eating may even help you improve the level of your blood pressure and reach or maintain a healthy weight. General recommendations for healthful eating and cooking habits include:  Eating meals and snacks regularly. Avoid going long periods of time without eating to lose weight.  Eating a diet that consists mainly of plant-based foods, such as fruits, vegetables, nuts, legumes, and whole grains.  Using low-heat cooking methods, such as baking, instead of high-heat cooking methods, such as deep frying.  Work with your dietitian to make sure you understand how to use the Nutrition Facts information on food labels. How can food affect me? Carbohydrates Carbohydrates affect your blood glucose level more than any other type of food. Your dietitian will help you determine how many carbohydrates to eat at each meal and teach you how to count carbohydrates. Counting carbohydrates is  important to keep your blood glucose at a healthy level, especially if you are using insulin or taking certain medicines for diabetes mellitus. Alcohol Alcohol can cause sudden decreases in blood glucose (hypoglycemia), especially if you use insulin or take certain medicines for diabetes mellitus. Hypoglycemia can be a life-threatening condition. Symptoms of hypoglycemia (sleepiness, dizziness, and disorientation) are similar to symptoms of having too much alcohol. If your health care provider has given you approval to drink alcohol, do so in moderation and use the following guidelines:  Women should not have more than one drink per day, and men should not have more than two drinks per day. One drink is equal to: ? 12 oz of beer. ? 5 oz of wine. ? 1 oz of hard liquor.  Do not drink on an empty stomach.  Keep yourself hydrated. Have water, diet soda, or unsweetened iced tea.  Regular soda, juice, and other mixers might contain a lot of carbohydrates and should be counted.  What foods are not recommended? As you make food choices, it is important to remember that all foods are not the same. Some foods have fewer nutrients per serving than other foods, even though they might have the same number of calories or carbohydrates. It is difficult to get your body what it needs when you eat foods with fewer nutrients. Examples of foods that you should avoid that are high in calories and carbohydrates but low in nutrients include:  Trans fats (most processed  foods list trans fats on the Nutrition Facts label).  Regular soda.  Juice.  Candy.  Sweets, such as cake, pie, doughnuts, and cookies.  Fried foods.  What foods can I eat? Eat nutrient-rich foods, which will nourish your body and keep you healthy. The food you should eat also will depend on several factors, including:  The calories you need.  The medicines you take.  Your weight.  Your blood glucose level.  Your blood pressure  level.  Your cholesterol level.  You should eat a variety of foods, including:  Protein. ? Lean cuts of meat. ? Proteins low in saturated fats, such as fish, egg whites, and beans. Avoid processed meats.  Fruits and vegetables. ? Fruits and vegetables that may help control blood glucose levels, such as apples, mangoes, and yams.  Dairy products. ? Choose fat-free or low-fat dairy products, such as milk, yogurt, and cheese.  Grains, bread, pasta, and rice. ? Choose whole grain products, such as multigrain bread, whole oats, and brown rice. These foods may help control blood pressure.  Fats. ? Foods containing healthful fats, such as nuts, avocado, olive oil, canola oil, and fish.  Does everyone with diabetes mellitus have the same meal plan? Because every person with diabetes mellitus is different, there is not one meal plan that works for everyone. It is very important that you meet with a dietitian who will help you create a meal plan that is just right for you. This information is not intended to replace advice given to you by your health care provider. Make sure you discuss any questions you have with your health care provider. Document Released: 09/17/2004 Document Revised: 05/29/2015 Document Reviewed: 11/17/2012 Elsevier Interactive Patient Education  2017 [employer].    We recommend that you schedule a mammogram for breast cancer screening. Typically, you do not need a referral to do this. Please contact a local imaging center to schedule your mammogram.  [facility] - [phone]  *ask for the Radiology Department [facility]) - [phone] or [phone]  [facility] - [phone] [phone] [facility] - [phone]  *ask for the [facility] - [phone]  *ask for the Radiology Department [facility] - [phone]  *ask  for the [facility] - [phone]

## 2016-09-15 NOTE — Telephone Encounter (Signed)
Patient called office, medications sent to incorrect pharmacy. Per patient, she is using [facility] on [facility]. Pharmacy changed and medications re-sent./ S.Dillin Lofgren,CMA

## 2016-09-16 LAB — COMPREHENSIVE METABOLIC PANEL
Albumin: 4.3 g/dL (ref 3.5–5.5)
BUN/Creatinine Ratio: 13 (ref 9–23)
BUN: 9 mg/dL (ref 6–24)
Bilirubin Total: 0.4 mg/dL (ref 0.0–1.2)
Creatinine, Ser: 0.71 mg/dL (ref 0.57–1.00)
GFR calc Af Amer: 121 mL/min/{1.73_m2} (ref >59)
Glucose: 256 mg/dL — ABNORMAL HIGH (ref 65–99)
Total Protein: 7.3 g/dL (ref 6.0–8.5)

## 2016-09-16 LAB — CBC
Hematocrit: 41.5 % (ref 34.0–46.6)
Hemoglobin: 13.7 g/dL (ref 11.1–15.9)
MCH: 27.8 pg (ref 26.6–33.0)
MCHC: 33 g/dL (ref 31.5–35.7)
MCV: 84 fL (ref 79–97)
Platelets: 370 10*3/uL (ref 150–379)
RBC: 4.92 x10E6/uL (ref 3.77–5.28)
RDW: 14 % (ref 12.3–15.4)
WBC: 7.4 10*3/uL (ref 3.4–10.8)

## 2016-09-16 LAB — COMPREHENSIVE METABOLIC PANEL WITH GFR
ALT: 27 IU/L (ref 0–32)
AST: 19 IU/L (ref 0–40)
Albumin/Globulin Ratio: 1.4 (ref 1.2–2.2)
Alkaline Phosphatase: 111 IU/L (ref 39–117)
CO2: 24 mmol/L (ref 20–29)
Calcium: 9.6 mg/dL (ref 8.7–10.2)
Chloride: 98 mmol/L (ref 96–106)
GFR calc non Af Amer: 105 mL/min/{1.73_m2} (ref >59)
Globulin, Total: 3 g/dL (ref 1.5–4.5)
Potassium: 4.1 mmol/L (ref 3.5–5.2)
Sodium: 139 mmol/L (ref 134–144)

## 2016-09-16 LAB — MICROALBUMIN, URINE: Microalbumin, Urine: 34.4 ug/mL

## 2016-09-16 LAB — TSH: TSH: 9.15 u[IU]/mL — ABNORMAL HIGH (ref 0.450–4.500)

## 2016-09-20 ENCOUNTER — Other Ambulatory Visit: Payer: Self-pay | Admitting: Physician Assistant

## 2016-09-20 DIAGNOSIS — Z794 Long term (current) use of insulin: Principal | ICD-10-CM

## 2016-09-20 DIAGNOSIS — E1165 Type 2 diabetes mellitus with hyperglycemia: Secondary | ICD-10-CM

## 2016-09-20 DIAGNOSIS — E118 Type 2 diabetes mellitus with unspecified complications: Principal | ICD-10-CM

## 2016-09-20 DIAGNOSIS — IMO0002 Reserved for concepts with insufficient information to code with codable children: Secondary | ICD-10-CM

## 2016-09-20 MED ORDER — METFORMIN HCL 500 MG PO TABS: 500.0000 mg | ORAL_TABLET | Freq: Two times a day (BID) | ORAL | 1 refills | Status: DC

## 2016-09-22 ENCOUNTER — Telehealth: Payer: Self-pay | Admitting: Physician Assistant

## 2016-09-22 NOTE — Telephone Encounter (Signed)
PATIENT WOULD LIKE WHITNEY MCVEY TO KNOW THAT SHE DOES NOT WANT THE METFORMIN (GLUCOPHAGE) 500 MG. SHE WANTS THE METFORMIN (GLUCOPHAGE) XR 500 MG. SHE SAID WHITNEY SAID IT WOULD BE ALRIGHT. BEST PHONE [phone] (CELL-PLEASE LEAVE HER A MESSAGE) [facility]AC[facility] IS [facility] ON [address] [place]

## 2016-09-30 ENCOUNTER — Encounter: Payer: Self-pay | Admitting: Physician Assistant

## 2016-09-30 MED ORDER — METFORMIN HCL ER 500 MG PO TB24: 500.0000 mg | ORAL_TABLET | Freq: Every day | ORAL | 1 refills | Status: DC

## 2016-09-30 NOTE — Progress Notes (Signed)
Please call pt and let her know her thyroid level came back elevated. I would like to start treatment. Please make and appt to discuss these results and plan.  Thank you!

## 2016-09-30 NOTE — Addendum Note (Signed)
Addended by: Dorise Hiss on: 09/30/2016 01:20 PM   Modules accepted: Orders

## 2016-09-30 NOTE — Telephone Encounter (Signed)
done

## 2016-09-30 NOTE — Addendum Note (Signed)
Addended by: Dorise Hiss on: 09/30/2016 01:25 PM   Modules accepted: Orders

## 2016-09-30 NOTE — Progress Notes (Signed)
Pt should come in before her OV for a LAB only visit for detailed thyroid labs. Thank you!

## 2016-10-01 NOTE — Progress Notes (Signed)
Letter sent.

## 2016-10-06 ENCOUNTER — Ambulatory Visit: Payer: 59 | Admitting: Physician Assistant

## 2016-10-08 ENCOUNTER — Telehealth: Payer: Self-pay | Admitting: Physician Assistant

## 2016-10-08 ENCOUNTER — Other Ambulatory Visit: Payer: Self-pay | Admitting: *Deleted

## 2016-10-08 NOTE — Telephone Encounter (Signed)
Pt called wanting to get a phone number to speak to someone above the Biochemist, clinical here gave her the patient number that I found on [facility] website. Pt also had some issues with her prescription metformin 500 mg xr being sent to the wrong pharmacy suppose to be sent to [facility] on [address] which was fixed but when sent to [facility] pt stated that the directions changed. davina took over the call and helped pt

## 2016-12-22 ENCOUNTER — Ambulatory Visit (INDEPENDENT_AMBULATORY_CARE_PROVIDER_SITE_OTHER): Payer: 59 | Admitting: Physician Assistant

## 2016-12-22 ENCOUNTER — Other Ambulatory Visit: Payer: Self-pay

## 2016-12-22 ENCOUNTER — Encounter: Payer: Self-pay | Admitting: Physician Assistant

## 2016-12-22 VITALS — BP 128/92 | HR 80 | Temp 98.9°F | Resp 16 | Ht 68.0 in | Wt 270.4 lb

## 2016-12-22 DIAGNOSIS — R7989 Other specified abnormal findings of blood chemistry: Secondary | ICD-10-CM | POA: Diagnosis not present

## 2016-12-22 DIAGNOSIS — E1121 Type 2 diabetes mellitus with diabetic nephropathy: Secondary | ICD-10-CM | POA: Diagnosis not present

## 2016-12-22 DIAGNOSIS — E785 Hyperlipidemia, unspecified: Secondary | ICD-10-CM

## 2016-12-22 NOTE — Patient Instructions (Signed)
     IF you received an x-ray today, you will receive an invoice from [facility]. Please contact  Radiology at [phone] with questions or concerns regarding your invoice.   IF you received labwork today, you will receive an invoice from [facility]. Please contact [facility] at [phone] with questions or concerns regarding your invoice.   Our billing staff will not be able to assist you with questions regarding bills from these companies.  You will be contacted with the lab results as soon as they are available. The fastest way to get your results is to activate your My Chart account. Instructions are located on the last page of this paperwork. If you have not heard from us regarding the results in 2 weeks, please contact this office.     

## 2016-12-22 NOTE — Progress Notes (Signed)
Fast track LAB only visit.

## 2016-12-23 LAB — LIPID PANEL
Chol/HDL Ratio: 4.3 ratio (ref 0.0–4.4)
Cholesterol, Total: 132 mg/dL (ref 100–199)
HDL: 31 mg/dL — ABNORMAL LOW (ref >39)
LDL Calculated: 76 mg/dL (ref 0–99)
Triglycerides: 124 mg/dL (ref 0–149)
VLDL Cholesterol Cal: 25 mg/dL (ref 5–40)

## 2016-12-23 LAB — THYROID PANEL WITH TSH
Free Thyroxine Index: 1.8 (ref 1.2–4.9)
T3 Uptake Ratio: 21 % — ABNORMAL LOW (ref 24–39)
T4, Total: 8.7 ug/dL (ref 4.5–12.0)
TSH: 6.09 u[IU]/mL — ABNORMAL HIGH (ref 0.450–4.500)

## 2016-12-23 LAB — HEMOGLOBIN A1C
Est. average glucose Bld gHb Est-mCnc: 192 mg/dL
Hgb A1c MFr Bld: 8.3 % — ABNORMAL HIGH (ref 4.8–5.6)

## 2017-01-17 NOTE — Progress Notes (Signed)
Please call pt and let her know: Your thyroid level is coming back down. We will continue to monitor and see what happens. Come back and see me in 3 months for recheck. We will check your thyroid level and blood sugar.  Keep up the great work!! Your cholesterol levels are improving significantly! And your hemoglobin A1C is coming down! I love it. :) Please schedule a follow-up appt for 3 months. Your results have been released to [facility].  Thank you!

## 2017-03-31 ENCOUNTER — Encounter: Payer: Self-pay | Admitting: Family Medicine

## 2017-03-31 ENCOUNTER — Ambulatory Visit (INDEPENDENT_AMBULATORY_CARE_PROVIDER_SITE_OTHER): Payer: BLUE CROSS/BLUE SHIELD | Admitting: Family Medicine

## 2017-03-31 ENCOUNTER — Other Ambulatory Visit: Payer: Self-pay

## 2017-03-31 VITALS — BP 128/82 | HR 88 | Temp 98.2°F | Resp 17 | Ht 68.0 in | Wt 268.8 lb

## 2017-03-31 DIAGNOSIS — G4733 Obstructive sleep apnea (adult) (pediatric): Secondary | ICD-10-CM | POA: Diagnosis not present

## 2017-03-31 DIAGNOSIS — Z0000 Encounter for general adult medical examination without abnormal findings: Secondary | ICD-10-CM | POA: Diagnosis not present

## 2017-03-31 DIAGNOSIS — Z124 Encounter for screening for malignant neoplasm of cervix: Secondary | ICD-10-CM | POA: Diagnosis not present

## 2017-03-31 DIAGNOSIS — E1121 Type 2 diabetes mellitus with diabetic nephropathy: Secondary | ICD-10-CM | POA: Diagnosis not present

## 2017-03-31 MED ORDER — METFORMIN HCL ER 500 MG PO TB24: 1000.0000 mg | ORAL_TABLET | Freq: Two times a day (BID) | ORAL | 1 refills | Status: DC

## 2017-03-31 NOTE — Progress Notes (Signed)
Chief Complaint  Patient presents with  . Annual Exam    pap    Subjective:  Kim Delgado is a 42 y.o. female here for a health maintenance visit.  Patient is established pt  Pt reports that she started having random sharp vaginal pains She states that it was on the right side She had a hysterectomy in 2011 She states that she was told she had a cervical cuff still in place She had a TAH/BSO She was having some constipation.   She reports that she was referred to [facility] She states that she did not go due to cost She would like another referral  Day time fatigue Hypersomnolence Snoring BMI>40 Wakes up choking and with dry mouth   Patient Active Problem List   Diagnosis Date Noted  . Hyperlipidemia 09/25/2015  . 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 Severe obesity (BMI >= 40) ([place]) 08/06/2013  . Acute bronchitis 08/06/2013  . Internal hemorrhoids 06/23/2012  . Diabetes ([place]) 03/16/2012  . BMI 40.0-44.9, adult ([place]) 10/19/2011  . Nicotine addiction 10/19/2011  . Rectal bleeding 12/23/2010    Past Medical History:  Diagnosis Date  . Allergy   . Anemia   . Bronchitis with bronchospasm   . Diabetes mellitus without complication ([place])   . Obesity   . Papilloma of breast   . Rectal bleeding     Past Surgical History:  Procedure Laterality Date  . ABDOMINAL HYSTERECTOMY     BSO; CERVIX INTACT; DUB; fibroids; endometriosis.  Dove.  Marland Kitchen BREAST SURGERY    . CYSTOSCOPY    . TUBAL LIGATION       Outpatient Medications Prior to Visit  Medication Sig Dispense Refill  . metFORMIN (GLUCOPHAGE-XR) 500 MG 24 hr tablet Take 1 tablet (500 mg total) by mouth daily with breakfast. (Patient taking differently: Take 1,000 mg by mouth 2 (two) times daily. ) 90 tablet 1  . metFORMIN (GLUCOPHAGE-XR) 500 MG 24 hr tablet Take 1,000 mg by mouth 2 (two) times daily at 8 am and 10 pm.    . Ascorbic Acid (VITAMIN C PO) Take by mouth. Will take three times a week     .  Blood Glucose Monitoring Suppl (GHT BLOOD GLUCOSE MONITOR) w/Device KIT 1 Device by Does not apply route 2 (two) times daily. (Patient not taking: Reported on 12/22/2016) 1 kit 0  . Calcium Carbonate-Vitamin D (CALCIUM PLUS VITAMIN D PO) Take 500 mg by mouth 3 (three) times a week.      . cyclobenzaprine (FLEXERIL) 5 MG tablet 1 pill by mouth up to every 8 hours as needed. Start with one pill by mouth each bedtime as needed due to sedation (Patient not taking: Reported on 03/31/2017) 30 tablet 5   No facility-administered medications prior to visit.     No Known Allergies   Family History  Problem Relation Age of Onset  . Transient ischemic attack Mother 8       x 2  . Hypertension Mother   . Diabetes Mother        diet controlled  . Stroke Mother   . Glaucoma Maternal Grandmother   . Diabetes Maternal Grandmother   . Drug abuse Father        iv drug abuse  . Breast cancer Maternal Aunt   . Throat cancer Maternal Aunt   . Lung cancer Paternal Aunt   . Colon cancer Neg Hx      Health Habits: Dental Exam: up to date Eye  Exam: up to date Exercise: 2-3 times/week on average Current exercise activities: walking/running Diet: is working on cutting back on meat  Social History   Socioeconomic History  . Marital status: Married    Spouse name: Floriston  . Number of children: 4  . Years of education: College  . Highest education level: Not on file  Occupational History  . Occupation: LPN    Employer: [employer]    Comment: 3rd shift at [place]  . Financial resource strain: Not on file  . Food insecurity:    Worry: Not on file    Inability: Not on file  . Transportation needs:    Medical: Not on file    Non-medical: Not on file  Tobacco Use  . Smoking status: Former Smoker    Packs/day: 0.30    Types: Cigarettes    Last attempt to quit: 07/05/2011    Years since quitting: 5.7  . Smokeless tobacco: Never Used  . Tobacco comment: Counseling sheet  given to quit smoking   Substance and Sexual Activity  . Alcohol use: No  . Drug use: No  . Sexual activity: Yes    Birth control/protection: Surgical  Lifestyle  . Physical activity:    Days per week: Not on file    Minutes per session: Not on file  . Stress: Not on file  Relationships  . Social connections:    Talks on phone: Not on file    Gets together: Not on file    Attends religious service: Not on file    Active member of club or organization: Not on file    Attends meetings of clubs or organizations: Not on file    Relationship status: Not on file  . Intimate partner violence:    Fear of current or ex partner: Not on file    Emotionally abused: Not on file    Physically abused: Not on file    Forced sexual activity: Not on file  Other Topics Concern  . Not on file  Social History Narrative   Patient is Marital(Miguel) status: married x 12 years.  Met 25 years ago.  No abuse.       Children: 4 children (21, 13, 11, 9); no grandchildren.      Lives: with husband 4 children.      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       Education: [employer].  Only has two year RN: wanting to go back for BSN.       Tobacco; quit 07/2011.  Smoked x 20 years.       Alcohol: wine weekly 1 per week; rarely.       Exercise: none in 03/2013; some exercise 2 times per week; walking.       Seatbelt: 100%      Guns: none      Patient is right-handed.   Patient drinks one cup of coffee every morning.      Social History   Substance and Sexual Activity  Alcohol Use No   Social History   Tobacco Use  Smoking Status Former Smoker  . Packs/day: 0.30  . Types: Cigarettes  . Last attempt to quit: 07/05/2011  . Years since quitting: 5.7  Smokeless Tobacco Never Used  Tobacco Comment   Counseling sheet given to quit smoking    Social History   Substance and Sexual Activity  Drug Use No    GYN:  Sexual Health LMP: No LMP recorded.  Patient has had a hysterectomy. Last pap smear: see HM section History of abnormal pap smears:  Sexually active:not currently Current contraception: TAH/BSO  Health Maintenance: See under health Maintenance activity for review of completion dates as well. Immunization History  Administered Date(s) Administered  . Hepatitis B 07/20/2012  . Hepatitis B, adult 08/19/2012, 03/17/2013  . Influenza,inj,Quad PF,6+ Mos 04/01/2014, 09/16/2014, 09/16/2015  . Influenza-Unspecified 10/04/2012, 10/04/2016  . MMR 07/20/2012  . Pneumococcal Polysaccharide-23 06/23/2012  . Tdap 04/17/2012     Depression Screen-PHQ2/9 Depression screen [facility] 2/9 03/31/2017 03/31/2017 12/22/2016 09/15/2016 09/16/2015  Decreased Interest 0 0 0 0 0  Down, Depressed, Hopeless 0 0 0 0 0  PHQ - 2 Score 0 0 0 0 0    Depression Severity and Treatment Recommendations:  0-4= None  5-9= Mild / Treatment: Support, educate to call if worse; return in one month  10-14= Moderate / Treatment: Support, watchful waiting; Antidepressant or Psycotherapy  15-19= Moderately severe / Treatment: Antidepressant OR Psychotherapy  >= 20 = Major depression, severe / Antidepressant AND Psychotherapy    Review of Systems   Review of Systems  Constitutional: Negative for chills and fever.  HENT: Positive for congestion. Negative for nosebleeds and sinus pain.   Respiratory: Negative for cough, shortness of breath and wheezing.   Cardiovascular: Negative for chest pain, palpitations and leg swelling.  Gastrointestinal: Negative for abdominal pain, constipation, diarrhea, nausea and vomiting.  Genitourinary: Negative for dysuria, frequency and urgency.  Skin: Negative for itching and rash.  Neurological: Negative for dizziness, tingling, tremors and headaches.  Psychiatric/Behavioral: Negative for depression. The patient is not nervous/anxious and does not have insomnia.     See HPI for ROS as well.    Objective:   Vitals:   03/31/17  1509  BP: 128/82  Pulse: 88  Resp: 17  Temp: 98.2 F (36.8 C)  TempSrc: Oral  SpO2: 98%  Weight: 268 lb 12.8 oz (121.9 kg)  Height: 5' 8"  (1.727 m)   BP Readings from Last 3 Encounters:  03/31/17 128/82  12/22/16 (!) 128/92  09/15/16 127/83   Wt Readings from Last 3 Encounters:  03/31/17 268 lb 12.8 oz (121.9 kg)  12/22/16 270 lb 6.4 oz (122.7 kg)  09/15/16 268 lb (121.6 kg)   Body mass index is 40.87 kg/m.  Physical Exam  Constitutional: She is oriented to person, place, and time. She appears well-developed and well-nourished.  HENT:  Head: Normocephalic and atraumatic.  Right Ear: External ear normal.  Left Ear: External ear normal.  Eyes: Conjunctivae and EOM are normal.  Cardiovascular: Normal rate and regular rhythm.  No murmur heard. Pulmonary/Chest: Effort normal and breath sounds normal. No respiratory distress. She has no wheezes.  Abdominal: Soft. She exhibits no distension. There is no tenderness. There is no rebound.  Musculoskeletal: Normal range of motion. She exhibits no edema.  Neurological: She is alert and oriented to person, place, and time. She has normal reflexes.  Skin: Skin is warm.  Psychiatric: She has a normal mood and affect. Her behavior is normal. Judgment and thought content normal.       Assessment/Plan:   Patient was seen for a health maintenance exam.  Counseled the patient on health maintenance issues. Reviewed her health mainteance schedule and ordered appropriate tests (see orders.) Counseled on regular exercise and weight management. Recommend regular eye exams and dental cleaning.   The following issues were addressed today for health maintenance:   Maitri was  seen today for annual exam.  Diagnoses and all orders for this visit:  Health maintenance examination- discussed age appropriate screenings Advised eye and dental exam annually  Pap smear for cervical cancer screening- reviewed pap smear guidelines -     Pap IG, CT/NG  NAA, and HPV (high risk) [facility]  OSA (obstructive sleep apnea)-  BMI meets criteria Will check for underlying metabolic causes -     Ambulatory referral to Sleep Studies -     TSH -     CBC  Type 2 diabetes mellitus with diabetic nephropathy, without long-term current use of insulin ([place])-  Refilled metformin  Not at goal as yet a1c goal<7% Close monitoring  -     Lipid panel -     Comprehensive metabolic panel -     Hemoglobin A1c -     metFORMIN (GLUCOPHAGE-XR) 500 MG 24 hr tablet; Take 2 tablets (1,000 mg total) by mouth 2 (two) times daily at 8 am and 10 pm.  Severe obesity (BMI >= 40) ([place])-  Will refer for sleep study based on BMI  Discussed weight loss  -     Ambulatory referral to Sleep Studies -     Lipid panel -     TSH  Other orders    No follow-ups on file.    Body mass index is 40.87 kg/m.:  Discussed the patient's BMI with patient. The BMI body mass index is 40.87 kg/m.     No future appointments.  Patient Instructions       IF you received an x-ray today, you will receive an invoice from [facility]. Please contact [facility] at [phone] with questions or concerns regarding your invoice.   IF you received labwork today, you will receive an invoice from [facility]. Please contact [facility] at [phone] with questions or concerns regarding your invoice.   Our billing staff will not be able to assist you with questions regarding bills from these companies.  You will be contacted with the lab results as soon as they are available. The fastest way to get your results is to activate your My Chart account. Instructions are located on the last page of this paperwork. If you have not heard from [place] regarding the results in 2 weeks, please contact this office.

## 2017-03-31 NOTE — Patient Instructions (Signed)
     IF you received an x-ray today, you will receive an invoice from [facility]. Please contact [facility] at [phone] with questions or concerns regarding your invoice.   IF you received labwork today, you will receive an invoice from [facility]. Please contact [facility] at [phone] with questions or concerns regarding your invoice.   Our billing staff will not be able to assist you with questions regarding bills from these companies.  You will be contacted with the lab results as soon as they are available. The fastest way to get your results is to activate your My Chart account. Instructions are located on the last page of this paperwork. If you have not heard from us regarding the results in 2 weeks, please contact this office.     

## 2017-04-01 LAB — CBC
HEMATOCRIT: 43 % (ref 34.0–46.6)
HEMOGLOBIN: 14 g/dL (ref 11.1–15.9)
MCH: 28.2 pg (ref 26.6–33.0)
MCHC: 32.6 g/dL (ref 31.5–35.7)
MCV: 87 fL (ref 79–97)
Platelets: 389 10*3/uL — ABNORMAL HIGH (ref 150–379)
RBC: 4.96 x10E6/uL (ref 3.77–5.28)
RDW: 14.2 % (ref 12.3–15.4)
WBC: 8.2 10*3/uL (ref 3.4–10.8)

## 2017-04-01 LAB — LIPID PANEL
CHOL/HDL RATIO: 5.4 ratio — AB (ref 0.0–4.4)
Cholesterol, Total: 161 mg/dL (ref 100–199)
HDL: 30 mg/dL — AB (ref >39)
LDL Calculated: 88 mg/dL (ref 0–99)
Triglycerides: 217 mg/dL — ABNORMAL HIGH (ref 0–149)
VLDL Cholesterol Cal: 43 mg/dL — ABNORMAL HIGH (ref 5–40)

## 2017-04-01 LAB — COMPREHENSIVE METABOLIC PANEL
ALT: 24 IU/L (ref 0–32)
AST: 18 IU/L (ref 0–40)
Albumin/Globulin Ratio: 1.6 (ref 1.2–2.2)
Albumin: 4.5 g/dL (ref 3.5–5.5)
Alkaline Phosphatase: 101 IU/L (ref 39–117)
BILIRUBIN TOTAL: 0.5 mg/dL (ref 0.0–1.2)
BUN/Creatinine Ratio: 12 (ref 9–23)
BUN: 8 mg/dL (ref 6–24)
CALCIUM: 9.5 mg/dL (ref 8.7–10.2)
CHLORIDE: 96 mmol/L (ref 96–106)
CO2: 27 mmol/L (ref 20–29)
Creatinine, Ser: 0.68 mg/dL (ref 0.57–1.00)
GFR calc non Af Amer: 108 mL/min/{1.73_m2} (ref >59)
GFR, EST AFRICAN AMERICAN: 125 mL/min/{1.73_m2} (ref >59)
GLUCOSE: 224 mg/dL — AB (ref 65–99)
Globulin, Total: 2.9 g/dL (ref 1.5–4.5)
Potassium: 3.9 mmol/L (ref 3.5–5.2)
Sodium: 139 mmol/L (ref 134–144)
TOTAL PROTEIN: 7.4 g/dL (ref 6.0–8.5)

## 2017-04-01 LAB — HEMOGLOBIN A1C
ESTIMATED AVERAGE GLUCOSE: 212 mg/dL
Hgb A1c MFr Bld: 9 % — ABNORMAL HIGH (ref 4.8–5.6)

## 2017-04-01 LAB — TSH: TSH: 6.36 u[IU]/mL — AB (ref 0.450–4.500)

## 2017-04-02 LAB — PAP IG, CT-NG NAA, HPV HIGH-RISK
Chlamydia, Nuc. Acid Amp: NEGATIVE
Gonococcus by Nucleic Acid Amp: NEGATIVE
HPV, HIGH-RISK: NEGATIVE
PAP SMEAR COMMENT: 0

## 2017-04-13 ENCOUNTER — Encounter: Payer: Self-pay | Admitting: *Deleted

## 2017-05-26 ENCOUNTER — Encounter: Payer: Self-pay | Admitting: Family Medicine

## 2017-05-27 ENCOUNTER — Encounter: Payer: Self-pay | Admitting: Family Medicine

## 2017-06-02 ENCOUNTER — Telehealth: Payer: Self-pay | Admitting: Family Medicine

## 2017-06-02 NOTE — Telephone Encounter (Signed)
Copied from [place] [id number]. Topic: Quick Communication - See Telephone Encounter >> Jun 02, 2017 10:49 AM Neva Seat wrote: Pt has high TSH levels which has been discussed w/ Dr. Nolon Rod.  Pt is wanting to be put on Levothyroxine synthroid. Please call pt to let her know once this has been done.

## 2017-06-08 NOTE — Telephone Encounter (Signed)
Patient called again wanting to talk to Dr. Nolon Rod or her CMA about her labs. Please call patient back, thanks.

## 2017-06-08 NOTE — Telephone Encounter (Signed)
Pt advised to f/u with [facility] per last lab note.. Transferred to scheduling for visit.

## 2017-06-09 ENCOUNTER — Other Ambulatory Visit: Payer: Self-pay

## 2017-06-09 ENCOUNTER — Ambulatory Visit (INDEPENDENT_AMBULATORY_CARE_PROVIDER_SITE_OTHER): Payer: BLUE CROSS/BLUE SHIELD | Admitting: Family Medicine

## 2017-06-09 ENCOUNTER — Encounter: Payer: Self-pay | Admitting: Family Medicine

## 2017-06-09 VITALS — BP 124/82 | HR 85 | Temp 98.7°F | Ht 68.0 in | Wt 270.6 lb

## 2017-06-09 DIAGNOSIS — R5382 Chronic fatigue, unspecified: Secondary | ICD-10-CM | POA: Diagnosis not present

## 2017-06-09 DIAGNOSIS — E1121 Type 2 diabetes mellitus with diabetic nephropathy: Secondary | ICD-10-CM | POA: Diagnosis not present

## 2017-06-09 DIAGNOSIS — E781 Pure hyperglyceridemia: Secondary | ICD-10-CM

## 2017-06-09 DIAGNOSIS — Z713 Dietary counseling and surveillance: Secondary | ICD-10-CM | POA: Diagnosis not present

## 2017-06-09 DIAGNOSIS — E039 Hypothyroidism, unspecified: Secondary | ICD-10-CM | POA: Diagnosis not present

## 2017-06-09 NOTE — Progress Notes (Signed)
Chief Complaint  Patient presents with  . Results    reveiw of lab work    HPI   Thyroid: Patient presents for evaluation of acquired hypothyroidism. Current symptoms include fatigue but works third shift.  feeling cold and cold intolerance, palpitations, sweating, she is s/p hysterectomy. Patient denies constipation, anxiousness, tremulousness, diarrhea, goiter, ocular symptoms.   She reports that she talked to the NP at her job and she should take Iodine in a multivitamin.  Component     Latest Ref Rng & Units 09/15/2016 12/22/2016 03/31/2017  TSH     0.450 - 4.500 uIU/mL 9.150 (H) 6.090 (H) 6.360 (H)  Thyroxine (T4)     4.5 - 12.0 ug/dL  8.7   T3 Uptake Ratio     24 - 39 %  21 (L)   Free Thyroxine Index     1.2 - 4.9  1.8     Diabetes Mellitus: Patient presents for follow up of diabetes.  Lab Results  Component Value Date   HGBA1C 9.0 (H) 03/31/2017   She denies any episodes of hypoglycemia She states that she went to the NP at work and was advised to check a fasting BG and checking her readings 1 hours after eating She was also given a book on cutting down on breads She has been out of town but was monitoring it She reports that her fasting sugars after going home from 3rd shift and would wake up at 6pm those fasting sugars are 200s but during the night shift her sugars were 120-150s overnight while snacking.  Chronic fatigue Pt works nights She gets off during the 3rd shift She does not exercise Her thyroid has been in the high tsh levels but no formal diagnosis of hypothyroidism  Past Medical History:  Diagnosis Date  . Allergy   . Anemia   . Bronchitis with bronchospasm   . Diabetes mellitus without complication ([place])   . Obesity   . Papilloma of breast   . Rectal bleeding     Current Outpatient Medications  Medication Sig Dispense Refill  . metFORMIN (GLUCOPHAGE-XR) 500 MG 24 hr tablet Take 2 tablets (1,000 mg total) by mouth 2 (two) times daily at 8 am  and 10 pm. 360 tablet 1   No current facility-administered medications for this visit.     Allergies: No Known Allergies  Past Surgical History:  Procedure Laterality Date  . ABDOMINAL HYSTERECTOMY     BSO; CERVIX INTACT; DUB; fibroids; endometriosis.  [place]ove.  Marland Kitchen BREAST SURGERY    . CYSTOSCOPY    . TUBAL LIGATION      Social History   Socioeconomic History  . Marital status: Married    Spouse name: Kenmar  . Number of children: 4  . Years of education: College  . Highest education level: Not on file  Occupational History  . 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 Financial resource strain: Not on file  . Food insecurity:    Worry: Not on file    Inability: Not on file  . Transportation needs:    Medical: Not on file    Non-medical: Not on file  Tobacco Use  . Smoking status: Former Smoker    Packs/day: 0.30    Types: Cigarettes    Last attempt to quit: 07/05/2011    Years since quitting: 5.9  . Smokeless tobacco: Never Used  . Tobacco comment: Counseling sheet  given to quit smoking   Substance and Sexual Activity  . Alcohol use: No  . Drug use: No  . Sexual activity: Yes    Birth control/protection: Surgical  Lifestyle  . Physical activity:    Days per week: Not on file    Minutes per session: Not on file  . Stress: Not on file  Relationships  . Social connections:    Talks on phone: Not on file    Gets together: Not on file    Attends religious service: Not on file    Active member of club or organization: Not on file    Attends meetings of clubs or organizations: Not on file    Relationship status: Not on file  Other Topics Concern  . Not on file  Social History Narrative   Patient is Marital(Miguel) status: married x 12 years.  Met 25 years ago.  No abuse.       Children: 4 children (21, 13, 11, 9); no grandchildren.      Lives: with husband 4 children.      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       Education: [employer].  Only has two year RN: wanting to go back for BSN.       Tobacco; quit 07/2011.  Smoked x 20 years.       Alcohol: wine weekly 1 per week; rarely.       Exercise: none in 03/2013; some exercise 2 times per week; walking.       Seatbelt: 100%      Guns: none      Patient is right-handed.   Patient drinks one cup of coffee every morning.       Family History  Problem Relation Age of Onset  . Transient ischemic attack Mother 90       x 2  . Hypertension Mother   . Diabetes Mother        diet controlled  . Stroke Mother   . Glaucoma Maternal Grandmother   . Diabetes Maternal Grandmother   . Drug abuse Father        iv drug abuse  . Breast cancer Maternal Aunt   . Throat cancer Maternal Aunt   . Lung cancer Paternal Aunt   . Colon cancer Neg Hx      ROS Review of Systems See HPI Constitution: No fevers or chills No malaise No diaphoresis Skin: No rash or itching Eyes: no blurry vision, no double vision GU: no dysuria or hematuria Neuro: no dizziness or headaches all others reviewed and negative   Objective: Vitals:   06/09/17 0945  BP: 124/82  Pulse: 85  Temp: 98.7 F (37.1 C)  TempSrc: Oral  SpO2: 97%  Weight: 270 lb 9.6 oz (122.7 kg)  Height: _0  (1.727 m)   Wt Readings from Last 3 Encounters:  06/09/17 270 lb 9.6 oz (122.7 kg)  03/31/17 268 lb 12.8 oz (121.9 kg)  12/22/16 270 lb 6.4 oz (122.7 kg)   Body mass index is 41.14 kg/m.    Physical Exam Physical Exam  Constitutional: She is oriented to person, place, and time. She appears well-developed and well-nourished.  HENT:  Head: Normocephalic and atraumatic.  Eyes: Conjunctivae and EOM are normal.  Neck: supple, no thyromegaly Cardiovascular: Normal rate, regular rhythm and normal heart sounds.   Pulmonary/Chest: Effort normal and breath sounds normal. No respiratory distress. She has no wheezes.  Abdominal:  Normal appearance and bowel sounds are normal. There is no tenderness. There is no CVA tenderness.  Neurological: She is alert and oriented to person, place, and time.    Assessment and Plan Davionna was seen today for results.  Diagnoses and all orders for this visit:  Type 2 diabetes mellitus with diabetic nephropathy, without long-term current use of insulin ([place])- discussed diabetes control Will check for modifiable risk factors -     Lipid panel; Future -     CMP14+EGFR; Future -     Hemoglobin A1c; Future  Hypertriglyceridemia- pt to return for fasting labs -     Lipid panel; Future -     CMP14+EGFR; Future  Acquired hypothyroidism- discussed that if the levels are still in the hypothyroid range will start Levothyroxine with plan for six weeks follow up then 3 months follow up -     CBC with Differential/Platelet; Future -     TSH + free T4; Future -     T3, Free; Future  Chronic fatigue- multifactorial disorder Will check for anemia, thyroid disease and electrolyte disorder as well as monitoring her diabetes -     CBC with Differential/Platelet; Future -     Lipid panel; Future -     CMP14+EGFR; Future -     TSH + free T4; Future -     T3, Free; Future -     Hemoglobin A1c; Future  Encounter for nutritional counseling- spent 12 minutes discussed dietary changes and answering questions about keto diet vs. Gluten free diet  Severe obesity (BMI >= 40) ([place])- discussed exercise     Morgyn Marut A Nolon Rod

## 2017-06-09 NOTE — Patient Instructions (Addendum)
Low Glycemic Foods (20-49)  Breakfast Cereals: All-Bran                All-Bran Fruit ' n Oats Fiber One               Oatmeal (not instant)  Oat bran  Fruits and fruit juices: (Limit to 1-2 servings per day) Apples               Apricots (fresh & dried)  Blackberries            Blueberries Cherries                  Cranberries             Peaches                  Pears                       Plums                       Prunes Grapefruit                Raspberries            Strawberries           Tangerine Apple juice             Grapefruit juice Tomato juice  Beans and legumes (fresh-cooked): Black-eyed peas     Butter beans Chick peas              Lentils     Green beans           Lima beans               Kidney beans          Navy beans  Non-starchy vegetables: Asparagus, bok choy, broccoli, cabbage, cauliflower, celery, cucumber, greens, lettuce, mushrooms, peppers, tomatoes, okra, onions, snow peas, spinach, summer squash  Grains: Barley                                Bulgur Rye                                    Wild rice  Nuts and oils : Almonds         Peanuts     Sunflower seeds  Hazelnuts      Pecans          Walnuts Oils that are liquid at room temperature  Dairy, fish, and meat: Milk, skim                         Lowfat cheese Yogurt, lowfat, fruit sugar sweetened Lean red meat                      Fish  Skinless chicken & [place]    Shellfish Moderate Glycemic Foods (50-69)  Breakfast Cereals: Bran Buds                             Bran Chex Just Right  Mini-Wheats Special K         Swiss muesli  Fruits: Banana (under-ripe)             Dates Figs                                      Grapes Kiwi                                      Mango Oranges                               Raisins  Fruit Juices: Cranberry juice                    Orange juice  Beans and legumes: [place]-type baked beans Canned pinto, kidney, or  navy beans Green peas  Vegetables: Beets                         Raw Carrots  Sweet potato              Yam Corn on the cob  Breads: Pita (pocket) bread          Oat bran bread Pumpernickel bread           Rye bread Wheat bread, high fiber        Grains: Cornmeal                           Rice, brown   Rice, white                         Couscous Pasta: Macaroni                           Pizza  cheese Raviolimeat filled           Spaghetti, white        Nuts: Cashews                           Macadamia  Snacks: Chocolate                     Ice cream lowfat  Muffin                         Popcorn High Glycemic Foods (70-100)   Breakfast Cereals: Cheerios                 Corn Chex Corn Flakes            Cream of Wheat Grape Nuts              Grape Nut Flakes Life                 Nutri-Grain       Puffed Rice               Puffed Wheat Rice Chex                 Rice Krispies Shredded Wheat  Team Total  Fruits: Pineapple                 Watermelon Banana (over-ripe)  Beverages: Sodas, sweet tea, pineapple juice  Vegetables: Potato, baked, boiled, fried, mashed [place] fries Canned or frozen corn Cooked carrots Parsnips Winter squash  Breads: Most breads (white and whole grain) Bagels                     Bread sticks Bread stuffing          Kaiser roll Dinner rolls  Grains: Rice, instant          Tapioca, with milk  Candy and most cookies Snacks: Donuts                      Corn chips        Jelly beans                 Pretzels Pastries                             Restaurant and ethnic foods Most [place] food (sugar in stir fry or wok sauces) Teriyaki-style meats and vegetables     From [email or web address] . Last Reviewed: August 28, 2015  . Last Edited: October 20, 2015   Protein Foods Foods high in protein such as fish, chicken, meats, soy products, and cheese, are all called "protein foods." You may also hear them referred to  as 'meats or meat substitutes." The biggest difference among foods in this group is how much fat they contain, and for the vegetarian proteins, whether they have carbohydrate.  Protein Choices Plant-Based Proteins Plant-based protein foods provide quality protein, healthy fats, and fiber. They vary in how much fat and carbohydrate they contain, so make sure to read labels. . Beans such as black, kidney, and pinto  . Bean products like baked beans and refried beans  . Hummus and falafel  . Lentils such as brown, green, or yellow  . Peas such as black-eyed or split peas  . Edamame  . Soy nuts  . Nuts and spreads like almond butter, cashew butter, or peanut butter  . Tempeh, tofu  . Products like meatless "chicken" nuggets, "beef" crumbles, "burgers", "bacon", "sausage", and "hot dogs"  Fish and Seafood Try to include fish at least 2 times per week. . Fish high in omega-3 fatty acids like [place] tuna, herring, mackerel, rainbow trout, sardines, and salmon  . Other fish including catfish, cod, flounder, haddock, halibut, orange roughy, and tilapia  . Shellfish including clams, crab, imitation shellfish, lobster, scallops, shrimp, oysters. Agricultural consultant without the skin for less saturated fat and cholesterol. . Chicken, [place], cornish hen .  Cheese and Eggs . Reduced-fat cheese or regular cheese in small amounts  . Cottage cheese  . Whole eggs Game . Buffalo, ostrich, rabbit, venison  . Dove, duck, goose, or pheasant (no skin) Beef, Pork, Veal, Lamb It's best to limit your intake of red meat which is often higher in saturated fat and processed meats like ham, bacon and hot dogs which are often higher in saturated fat and sodium. If you decide to have these, choose the leanest options, which are: . Select or Choice grades of beef trimmed of fat including: chuck, rib, rump roast, round, sirloin, cubed, flank, porterhouse, T-bone steak, tenderloin  . Lamb: chop, leg, or roast   . Veal: loin chop or  roast  . Pork: Canadian bacon, center loin chop, ham, tenderloin    IF you received an x-ray today, you will receive an invoice from [facility]. Please contact [facility] at [phone] with questions or concerns regarding your invoice.   IF you received labwork today, you will receive an invoice from [facility]. Please contact [facility] at [phone] with questions or concerns regarding your invoice.   Our billing staff will not be able to assist you with questions regarding bills from these companies.  You will be contacted with the lab results as soon as they are available. The fastest way to get your results is to activate your My Chart account. Instructions are located on the last page of this paperwork. If you have not heard from [place] regarding the results in 2 weeks, please contact this office.

## 2017-06-10 ENCOUNTER — Ambulatory Visit: Payer: BLUE CROSS/BLUE SHIELD

## 2017-06-10 DIAGNOSIS — E781 Pure hyperglyceridemia: Secondary | ICD-10-CM | POA: Diagnosis not present

## 2017-06-10 DIAGNOSIS — E039 Hypothyroidism, unspecified: Secondary | ICD-10-CM | POA: Diagnosis not present

## 2017-06-10 DIAGNOSIS — R5382 Chronic fatigue, unspecified: Secondary | ICD-10-CM

## 2017-06-10 DIAGNOSIS — E1121 Type 2 diabetes mellitus with diabetic nephropathy: Secondary | ICD-10-CM

## 2017-06-11 LAB — CBC WITH DIFFERENTIAL/PLATELET
BASOS ABS: 0 10*3/uL (ref 0.0–0.2)
Basos: 0 %
EOS (ABSOLUTE): 0.3 10*3/uL (ref 0.0–0.4)
Eos: 4 %
Hematocrit: 38.4 % (ref 34.0–46.6)
Hemoglobin: 12.2 g/dL (ref 11.1–15.9)
IMMATURE GRANULOCYTES: 0 %
Immature Grans (Abs): 0 10*3/uL (ref 0.0–0.1)
Lymphocytes Absolute: 3.3 10*3/uL — ABNORMAL HIGH (ref 0.7–3.1)
Lymphs: 43 %
MCH: 27.7 pg (ref 26.6–33.0)
MCHC: 31.8 g/dL (ref 31.5–35.7)
MCV: 87 fL (ref 79–97)
MONOS ABS: 0.3 10*3/uL (ref 0.1–0.9)
Monocytes: 4 %
NEUTROS PCT: 49 %
Neutrophils Absolute: 3.6 10*3/uL (ref 1.4–7.0)
PLATELETS: 366 10*3/uL (ref 150–450)
RBC: 4.41 x10E6/uL (ref 3.77–5.28)
RDW: 13.9 % (ref 12.3–15.4)
WBC: 7.6 10*3/uL (ref 3.4–10.8)

## 2017-06-11 LAB — CMP14+EGFR
ALBUMIN: 4.4 g/dL (ref 3.5–5.5)
ALK PHOS: 89 IU/L (ref 39–117)
ALT: 20 IU/L (ref 0–32)
AST: 15 IU/L (ref 0–40)
Albumin/Globulin Ratio: 1.6 (ref 1.2–2.2)
BUN / CREAT RATIO: 15 (ref 9–23)
BUN: 9 mg/dL (ref 6–24)
Bilirubin Total: 0.3 mg/dL (ref 0.0–1.2)
CO2: 27 mmol/L (ref 20–29)
CREATININE: 0.61 mg/dL (ref 0.57–1.00)
Calcium: 9.7 mg/dL (ref 8.7–10.2)
Chloride: 99 mmol/L (ref 96–106)
GFR calc non Af Amer: 112 mL/min/{1.73_m2} (ref >59)
GFR, EST AFRICAN AMERICAN: 129 mL/min/{1.73_m2} (ref >59)
GLOBULIN, TOTAL: 2.8 g/dL (ref 1.5–4.5)
Glucose: 118 mg/dL — ABNORMAL HIGH (ref 65–99)
Potassium: 4.2 mmol/L (ref 3.5–5.2)
SODIUM: 139 mmol/L (ref 134–144)
Total Protein: 7.2 g/dL (ref 6.0–8.5)

## 2017-06-11 LAB — T3, FREE: T3 FREE: 3 pg/mL (ref 2.0–4.4)

## 2017-06-11 LAB — LIPID PANEL
CHOLESTEROL TOTAL: 130 mg/dL (ref 100–199)
Chol/HDL Ratio: 4.3 ratio (ref 0.0–4.4)
HDL: 30 mg/dL — ABNORMAL LOW (ref >39)
LDL CALC: 69 mg/dL (ref 0–99)
Triglycerides: 157 mg/dL — ABNORMAL HIGH (ref 0–149)
VLDL Cholesterol Cal: 31 mg/dL (ref 5–40)

## 2017-06-11 LAB — HEMOGLOBIN A1C
ESTIMATED AVERAGE GLUCOSE: 183 mg/dL
HEMOGLOBIN A1C: 8 % — AB (ref 4.8–5.6)

## 2017-06-11 LAB — TSH+FREE T4
Free T4: 1 ng/dL (ref 0.82–1.77)
TSH: 7.45 u[IU]/mL — ABNORMAL HIGH (ref 0.450–4.500)

## 2017-06-20 ENCOUNTER — Telehealth: Payer: Self-pay | Admitting: Family Medicine

## 2017-06-20 NOTE — Telephone Encounter (Signed)
Copied from [place] [phone]. Topic: Inquiry >> Jun 20, 2017  7:30 AM Pricilla Handler wrote: Reason for CRM: Patient has requested a call back from [facility] concerning a new synthroid medication Dr. Nolon Rod was to start the patient on. Patient wants a call back today.       Thank You!!!

## 2017-06-21 NOTE — Telephone Encounter (Signed)
Patient states Dr. Nolon Rod said she would call in a prescription for thyroid medication

## 2017-06-25 ENCOUNTER — Other Ambulatory Visit: Payer: Self-pay | Admitting: Family Medicine

## 2017-06-25 MED ORDER — LEVOTHYROXINE SODIUM 25 MCG PO TABS: 25.0000 ug | ORAL_TABLET | Freq: Every day | ORAL | 0 refills | Status: DC

## 2017-06-25 NOTE — Telephone Encounter (Signed)
Spoke with pt advised will send message to dr Nolon Rod re: synthroid and that pt needs it as she is having symptoms as she discussed with her at last ov.  Pharmacy is [place].  Please advise. Dgaddy, CMA

## 2017-06-27 ENCOUNTER — Telehealth: Payer: Self-pay | Admitting: Family Medicine

## 2017-06-27 NOTE — Telephone Encounter (Signed)
Copied from [place] [id number]. Topic: Inquiry >> Jun 20, 2017  7:30 AM Pricilla Handler wrote: Reason for CRM: Patient has requested a call back from [facility] concerning a new synthroid medication Dr. Nolon Rod was to start the patient on. Patient wants a call back today.       Thank You!!!  >> Jun 27, 2017  1:10 PM Ahmed Prima L wrote: Patient called in angry because nothing has been done for her about her medication for her thyroid. Please call patient back today if possible. She said she was told by Dr Nolon Rod to be on medication and nothing has been called in her. She was called on Saturday and was told she would get a call back. Please advise.   Call back @ [phone]

## 2017-06-28 NOTE — Telephone Encounter (Signed)
Advised patient the prescription was sent on 6/22   Patient to call pharmacy make sure ready for pickup if any issues patient was given my name to ask for.

## 2017-07-06 NOTE — Telephone Encounter (Signed)
Filled 06/25/17. Dgaddy, CMA

## 2017-07-21 ENCOUNTER — Ambulatory Visit: Payer: BLUE CROSS/BLUE SHIELD | Admitting: Family Medicine

## 2017-11-21 ENCOUNTER — Ambulatory Visit: Payer: BLUE CROSS/BLUE SHIELD | Admitting: Family Medicine

## 2017-11-21 ENCOUNTER — Encounter: Payer: Self-pay | Admitting: Family Medicine

## 2017-11-21 VITALS — BP 100/62 | HR 72 | Temp 98.8°F | Ht 68.0 in | Wt 262.0 lb

## 2017-11-21 DIAGNOSIS — Z7689 Persons encountering health services in other specified circumstances: Secondary | ICD-10-CM

## 2017-11-21 DIAGNOSIS — K649 Unspecified hemorrhoids: Secondary | ICD-10-CM

## 2017-11-21 DIAGNOSIS — E039 Hypothyroidism, unspecified: Secondary | ICD-10-CM

## 2017-11-21 DIAGNOSIS — E119 Type 2 diabetes mellitus without complications: Secondary | ICD-10-CM | POA: Diagnosis not present

## 2017-11-21 NOTE — Patient Instructions (Signed)
How to Avoid Diabetes Mellitus Problems You can take action to prevent or slow down problems that are caused by diabetes (diabetes mellitus). Following your diabetes plan and taking care of yourself can reduce your risk of serious or life-threatening complications. Manage your diabetes  Follow instructions from your health care providers about managing your diabetes. Your diabetes may be managed by a team of health care providers who can teach you how to care for yourself and can answer questions that you have.  Educate yourself about your condition so you can make healthy choices about eating and physical activity.  Check your blood sugar (glucose) levels as often as directed. Your health care provider will help you decide how often to check your blood glucose level depending on your treatment goals and how well you are meeting them.  Ask your health care provider if you should take low-dose aspirin daily and what dose is recommended for you. Taking low-dose aspirin daily is recommended to help prevent cardiovascular disease. Do not use nicotine or tobacco Do not use any products that contain nicotine or tobacco, such as cigarettes and e-cigarettes. If you need help quitting, ask your health care provider. Nicotine raises your risk for diabetes problems. If you quit using nicotine:  You will lower your risk for heart attack, stroke, nerve disease, and kidney disease.  Your cholesterol and blood pressure may improve.  Your blood circulation will improve.  Keep your blood pressure under control To control your blood pressure:  Follow instructions from your health care provider about meal planning, exercise, and medicines.  Make sure your health care provider checks your blood pressure at every medical visit.  A blood pressure reading consists of two numbers. Generally, the goal is to keep your top number (systolic pressure) at or below 130, and your bottom number (diastolic pressure) at or  below 80. Your health care provider may recommend a lower target blood pressure. Your individualized target blood pressure is determined based on:  Your age.  Your medicines.  How long you have had diabetes.  Any other medical conditions you have.  Keep your cholesterol under control To control your cholesterol:  Follow instructions from your health care provider about meal planning, exercise, and medicines.  Have your cholesterol checked at least once a year.  You may be prescribed medicine to lower cholesterol (statin). If you are not taking a statin, ask your health care provider if you should be.  Controlling your cholesterol may:  Help prevent heart disease and stroke. These are the most common health problems for people with diabetes.  Improve your blood flow.  Schedule and keep yearly physical exams and eye exams Your health care provider will tell you how often you need medical visits depending on your diabetes management plan. Keep all follow-up visits as directed. This is important so possible problems can be identified early and complications can be avoided or treated.  Every visit with your health care provider should include measuring your: ? Weight. ? Blood pressure. ? Blood glucose control.  Your A1c (hemoglobin A1c) level should be checked: ? At least 2 times a year, if you are meeting your treatment goals. ? 4 times a year, if you are not meeting treatment goals or if your treatment goals have changed.  Your blood lipids (lipid profile) should be checked yearly. You should also be checked yearly for protein in your urine (urine microalbumin).  If you have type 1 diabetes, get an eye exam 3-5 years after you  are diagnosed, and then once a year after your first exam.  If you have type 2 diabetes, get an eye exam as soon as you are diagnosed, and then once a year after your first exam.  Keep your vaccines current It is recommended that you receive:  A flu  (influenza) vaccine every year.  A pneumonia (pneumococcal) vaccine and a hepatitis B vaccine. If you are age 6 or older, you may get the pneumonia vaccine as a series of two separate shots.  Ask your health care provider which other vaccines may be recommended. Take care of your feet Diabetes may cause you to have poor blood circulation to your legs and feet. Because of this, taking care of your feet is very important. Diabetes can cause:  The skin on the feet to get thinner, break more easily, and heal more slowly.  Nerve damage in your legs and feet, which results in decreased feeling. You may not notice minor injuries that could lead to serious problems.  To avoid foot problems:  Check your skin and feet every day for cuts, bruises, redness, blisters, or sores.  Schedule a foot exam with your health care provider once every year. This exam includes: ? Inspecting of the structure and skin of your feet. ? Checking the pulses and sensation in your feet.  Make sure that your health care provider performs a visual foot exam at every medical visit.  Take care of your teeth People with poorly controlled diabetes are more likely to have gum (periodontal) disease. Diabetes can make periodontal diseases harder to control. If not treated, periodontal diseases can lead to tooth loss. To prevent this:  Brush your teeth twice a day.  Floss at least once a day.  Visit your dentist 2 times a year.  Drink responsibly Limit alcohol intake to no more than 1 drink a day for nonpregnant women and 2 drinks a day for men. One drink equals 12 oz of beer, 5 oz of wine, or 1 oz of hard liquor. It is important to eat food when you drink alcohol to avoid low blood glucose (hypoglycemia). Avoid alcohol if you:  Have a history of alcohol abuse or dependence.  Are pregnant.  Have liver disease, pancreatitis, advanced neuropathy, or severe hypertriglyceridemia.  Lessen stress Living with diabetes can  be stressful. When you are experiencing stress, your blood glucose may be affected in two ways:  Stress hormones may cause your blood glucose to rise.  You may be distracted from taking good care of yourself.  Be aware of your stress level and make changes to help you manage challenging situations. To lower your stress levels:  Consider joining a support group.  Do planned relaxation or meditation.  Do a hobby that you enjoy.  Maintain healthy relationships.  Exercise regularly.  Work with your health care provider or a mental health professional.  Summary  You can take action to prevent or slow down problems that are caused by diabetes (diabetes mellitus). Following your diabetes plan and taking care of yourself can reduce your risk of serious or life-threatening complications.  Follow instructions from your health care providers about managing your diabetes. Your diabetes may be managed by a team of health care providers who can teach you how to care for yourself and can answer questions that you have.  Your health care provider will tell you how often you need medical visits depending on your diabetes management plan. Keep all follow-up visits as directed. This is important  so possible problems can be identified early and complications can be avoided or treated. This information is not intended to replace advice given to you by your health care provider. Make sure you discuss any questions you have with your health care provider. Document Released: 09/08/2010 Document Revised: 09/20/2015 Document Reviewed: 09/20/2015 Elsevier Interactive Patient Education  2018 [employer].  Hypothyroidism Hypothyroidism is a disorder of the thyroid. The thyroid is a large gland that is located in the lower front of the neck. The thyroid releases hormones that control how the body works. With hypothyroidism, the thyroid does not make enough of these hormones. What are the causes? Causes of  hypothyroidism may include:  Viral infections.  Pregnancy.  Your own defense system (immune system) attacking your thyroid.  Certain medicines.  Birth defects.  Past radiation treatments to your head or neck.  Past treatment with radioactive iodine.  Past surgical removal of part or all of your thyroid.  Problems with the gland that is located in the center of your brain (pituitary).  What are the signs or symptoms? Signs and symptoms of hypothyroidism may include:  Feeling as though you have no energy (lethargy).  Inability to tolerate cold.  Weight gain that is not explained by a change in diet or exercise habits.  Dry skin.  Coarse hair.  Menstrual irregularity.  Slowing of thought processes.  Constipation.  Sadness or depression.  How is this diagnosed? Your health care provider may diagnose hypothyroidism with blood tests and ultrasound tests. How is this treated? Hypothyroidism is treated with medicine that replaces the hormones that your body does not make. After you begin treatment, it may take several weeks for symptoms to go away. Follow these instructions at home:  Take medicines only as directed by your health care provider.  If you start taking any new medicines, tell your health care provider.  Keep all follow-up visits as directed by your health care provider. This is important. As your condition improves, your dosage needs may change. You will need to have blood tests regularly so that your health care provider can watch your condition. Contact a health care provider if:  Your symptoms do not get better with treatment.  You are taking thyroid replacement medicine and: ? You sweat excessively. ? You have tremors. ? You feel anxious. ? You lose weight rapidly. ? You cannot tolerate heat. ? You have emotional swings. ? You have diarrhea. ? You feel weak. Get help right away if:  You develop chest pain.  You develop an irregular  heartbeat.  You develop a rapid heartbeat. This information is not intended to replace advice given to you by your health care provider. Make sure you discuss any questions you have with your health care provider. Document Released: 12/21/2004 Document Revised: 05/29/2015 Document Reviewed: 05/08/2013 Elsevier Interactive Patient Education  2018 [employer].

## 2017-11-21 NOTE — Progress Notes (Signed)
Patient presents to clinic today to establish care.  SUBJECTIVE: PMH: Pt is a 42 yo female with pmh sig for DM II, hemorrhoids, hypothyroidism.  Pt was previously seen by Dr. Delia Chimes at [facility].  DM II: -dx'd 8 yrs ago -taking metformin XR 1000 mg BID -eating paleo diet.  Hypothyroidism: -taking levothyroxine 37.5 mg daily -last TSH yesterday, unsure of results  H/o hemorrhoids: -internal -taking a probiotic daily  Allergies: NKDA Walnuts/pecans  Social Hx: Pt is married.  She is employed as an Therapist, sports.  Pt denies EtOH, tobacco, and drug use.  Pt notes past smoking hx.  Health Maintenance: Dental -- [facility], but plans on changing. Immunizations --influenza vaccine 2019, TB test 2018 Colonoscopy --2014? Mammogram --2013 PAP --2018? LMP--2011.  S/p hysterectomy 2011 for endometriosis, fibroids  Past Medical History:  Diagnosis Date  . Allergy   . Anemia   . Bronchitis with bronchospasm   . Diabetes mellitus without complication ([place])   . Obesity   . Papilloma of breast   . Rectal bleeding     Past Surgical History:  Procedure Laterality Date  . ABDOMINAL HYSTERECTOMY     BSO; CERVIX INTACT; DUB; fibroids; endometriosis.  [place]ove.  Marland Kitchen BREAST SURGERY    . CYSTOSCOPY    . TUBAL LIGATION      Current Outpatient Medications on File Prior to Visit  Medication Sig Dispense Refill  . levothyroxine (SYNTHROID, LEVOTHROID) 25 MCG tablet Take 1 tablet (25 mcg total) by mouth daily before breakfast. Take on an empty stomach 1 hr before breakfast. Drink plenty of water. (Patient taking differently: Take 37.5 mcg by mouth daily before breakfast. Take on an empty stomach 1 hr before breakfast. Drink plenty of water.) 90 tablet 0  . metFORMIN (GLUCOPHAGE-XR) 500 MG 24 hr tablet Take 2 tablets (1,000 mg total) by mouth 2 (two) times daily at 8 am and 10 pm. 360 tablet 1   No current facility-administered medications on file prior to visit.     No Known  Allergies  Family History  Problem Relation Age of Onset  . Transient ischemic attack Mother 53       x 2  . Hypertension Mother   . Diabetes Mother        diet controlled  . Stroke Mother   . Glaucoma Maternal Grandmother   . Diabetes Maternal Grandmother   . Drug abuse Father        iv drug abuse  . Breast cancer Maternal Aunt   . Throat cancer Maternal Aunt   . Lung cancer Paternal Aunt   . Colon cancer Neg Hx     Social History   Socioeconomic History  . Marital status: Married    Spouse name: [place]  . Number of children: 4  . Years of education: College  . Highest education level: Not on file  Occupational History  . Occupation: LPN    Employer: [employer]    Comment: 3rd shift at [employer]  . Financial resource strain: Not on file  . Food insecurity:    Worry: Not on file    Inability: Not on file  . Transportation needs:    Medical: Not on file    Non-medical: Not on file  Tobacco Use  . Smoking status: Former Smoker    Packs/day: 0.30    Types: Cigarettes    Last attempt to quit: 07/05/2011    Years since quitting: 6.3  . Smokeless tobacco: Never Used  .  Tobacco comment: Counseling sheet given to quit smoking   Substance and Sexual Activity  . Alcohol use: No  . Drug use: No  . Sexual activity: Yes    Birth control/protection: Surgical  Lifestyle  . Physical activity:    Days per week: Not on file    Minutes per session: Not on file  . Stress: Not on file  Relationships  . Social connections:    Talks on phone: Not on file    Gets together: Not on file    Attends religious service: Not on file    Active member of club or organization: Not on file    Attends meetings of clubs or organizations: Not on file    Relationship status: Not on file  . Intimate partner violence:    Fear of current or ex partner: Not on file    Emotionally abused: Not on file    Physically abused: Not on file    Forced sexual activity: Not on file   Other Topics Concern  . Not on file  Social History Narrative   Patient is Marital(Miguel) status: married x 12 years.  Met 25 years ago.  No abuse.       Children: 4 children (21, 13, 11, 9); no grandchildren.      Lives: with husband 4 children.      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       Education: [employer].  Only has two year RN: wanting to go back for BSN.       Tobacco; quit 07/2011.  Smoked x 20 years.       Alcohol: wine weekly 1 per week; rarely.       Exercise: none in 03/2013; some exercise 2 times per week; walking.       Seatbelt: 100%      Guns: none      Patient is right-handed.   Patient drinks one cup of coffee every morning.       ROS General: Denies fever, chills, night sweats, changes in weight, changes in appetite HEENT: Denies headaches, ear pain, changes in vision, rhinorrhea, sore throat CV: Denies CP, palpitations, SOB, orthopnea Pulm: Denies SOB, cough, wheezing GI: Denies abdominal pain, nausea, vomiting, diarrhea, constipation GU: Denies dysuria, hematuria, frequency, vaginal discharge Msk: Denies muscle cramps, joint pains Neuro: Denies weakness, numbness, tingling Skin: Denies rashes, bruising Psych: Denies depression, anxiety, hallucinations  BP 100/62 (BP Location: Right Arm, Patient Position: Sitting, Cuff Size: Large)   Pulse 72   Temp 98.8 F (37.1 C) (Oral)   Ht 5' 8"  (1.727 m)   Wt 262 lb (118.8 kg)   SpO2 95%   BMI 39.84 kg/m   Physical Exam Gen. Pleasant, well developed, well-nourished, in NAD HEENT - [place]/AT, PERRL, no scleral icterus, no nasal drainage.  No thyromegaly Lungs: no use of accessory muscles, CTAB, no wheezes, rales or rhonchi Cardiovascular: RRR, No r/g/m, no peripheral edema Abdomen: BS present, soft, nontender,nondistended, Neuro:  A&Ox3, CN II-XII intact, normal gait Skin:  Warm, dry, intact, no lesions  No results found for this or any previous visit (from  the past 2160 hour(s)).  Assessment/Plan: Type 2 diabetes mellitus without complication, without long-term current use of insulin (HCC) -Continue metformin XR 1000 mg twice daily -Continue lifestyle modification  Hemorrhoids, unspecified hemorrhoid type -Stable -Discussed increasing p.o. intake of water and vegetables to prevent constipation and straining.  Acquired hypothyroidism -Continue levothyroxine 37.5 mcg daily -Last  TSH yesterday.  Awaiting results.  Will make adjustments to medications if needed  Encounter to establish care -We reviewed the PMH, PSH, FH, SH, Meds and Allergies. -We provided refills for any medications we will prescribe as needed. -We addressed current concerns per orders and patient instructions. -We have asked for records for pertinent exams, studies, vaccines and notes from previous providers. -We have advised patient to follow up per instructions below.  F/u prn  Grier Mitts, MD

## 2017-11-24 DIAGNOSIS — E039 Hypothyroidism, unspecified: Secondary | ICD-10-CM | POA: Insufficient documentation

## 2018-02-04 ENCOUNTER — Other Ambulatory Visit: Payer: Self-pay

## 2018-02-04 ENCOUNTER — Encounter: Payer: Self-pay | Admitting: Family Medicine

## 2018-02-04 ENCOUNTER — Ambulatory Visit: Payer: BLUE CROSS/BLUE SHIELD | Admitting: Family Medicine

## 2018-02-04 VITALS — BP 126/83 | HR 84 | Temp 98.6°F | Ht 68.0 in | Wt 267.2 lb

## 2018-02-04 DIAGNOSIS — Z09 Encounter for follow-up examination after completed treatment for conditions other than malignant neoplasm: Secondary | ICD-10-CM

## 2018-02-04 DIAGNOSIS — R233 Spontaneous ecchymoses: Secondary | ICD-10-CM

## 2018-02-04 DIAGNOSIS — E119 Type 2 diabetes mellitus without complications: Secondary | ICD-10-CM

## 2018-02-04 DIAGNOSIS — K649 Unspecified hemorrhoids: Secondary | ICD-10-CM

## 2018-02-04 DIAGNOSIS — T148XXA Other injury of unspecified body region, initial encounter: Secondary | ICD-10-CM | POA: Insufficient documentation

## 2018-02-04 LAB — POCT CBC
Granulocyte percent: 54 %G (ref 37–80)
HCT, POC: 37.3 % (ref 29–41)
Hemoglobin: 12.4 g/dL (ref 11–14.6)
Lymph, poc: 3.2 (ref 0.6–3.4)
MCH, POC: 28.3 pg (ref 27–31.2)
MCHC: 33.2 g/dL (ref 31.8–35.4)
MCV: 85.2 fL (ref 76–111)
MID (cbc): 0.2 (ref 0–0.9)
MPV: 7.5 fL (ref 0–99.8)
POC Granulocyte: 3.9 (ref 2–6.9)
POC LYMPH PERCENT: 43.2 %L (ref 10–50)
POC MID %: 2.8 %M (ref 0–12)
Platelet Count, POC: 374 10*3/uL (ref 142–424)
RBC: 4.38 M/uL (ref 4.04–5.48)
RDW, POC: 14 %
WBC: 7.3 10*3/uL (ref 4.6–10.2)

## 2018-02-04 NOTE — Progress Notes (Signed)
Established Patient Office Visit  Subjective:  Patient ID: Kim Delgado, female    DOB: 19-May-1975  Age: 43 y.o. MRN: [id number]  CC:  Chief Complaint  Patient presents with  . Generalized Body Aches    sharp electrical like pain in mostly the extremites and warmth in the back   . abnormal brusing    on both legs  . Chills    ongoing    HPI Kim Delgado is a 43 year female who presents for follow up.   Past Medical History:  Diagnosis Date  . Allergy   . Anemia   . Bronchitis with bronchospasm   . Diabetes mellitus without complication ([place])   . Internal hemorrhoids   . Obesity   . Papilloma of breast   . Rectal bleeding    Current Status: Since her last office visit, her left leg pain began on Thursday 1/30, and she was unable to walk. She has been having increased bruising in both legs. She works at night is is often on her feet.  She reports an incident of blood in her stools, which she r/t her internal hemorrhoids. She denies petechiae, gingival bleeding, epistaxis, profuse bleeding from superficial cuts, menorrhagia, mentrorrhagia, hematochezia, and hematuria. She denies fatigue, frequent urination, blurred vision, excessive hunger, excessive thirst, weight gain, weight loss, and poor wound healing. Her last Hgb A1c on 01/13/2018 was 6.8.  She denies fevers, chills, fatigue, recent infections, weight loss, and night sweats. She has not had any headaches, and falls. No chest pain, heart palpitations, cough and shortness of breath reported. No reports of GI problems such as nausea, vomiting, diarrhea, and constipation. She has no reports of dysuria and hematuria. No depression or anxiety reported.    Past Surgical History:  Procedure Laterality Date  . ABDOMINAL HYSTERECTOMY     BSO; CERVIX INTACT; DUB; fibroids; endometriosis.  [place].  Marland Kitchen BREAST SURGERY    . CYSTOSCOPY    . TUBAL LIGATION      Family History  Problem Relation Age of Onset  . Transient ischemic attack  Mother 5       x 2  . Hypertension Mother   . Diabetes Mother        diet controlled  . Stroke Mother   . Glaucoma Maternal Grandmother   . Diabetes Maternal Grandmother   . Drug abuse Father        iv drug abuse  . Breast cancer Maternal Aunt   . Throat cancer Maternal Aunt   . Lung cancer Paternal Aunt   . Colon cancer Neg Hx     Social History   Socioeconomic History  . Marital status: Married    Spouse name: [place]  . Number of children: 4  . Years of education: College  . Highest education level: Not on file  Occupational History  . 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 Financial resource strain: Not on file  . Food insecurity:    Worry: Not on file    Inability: Not on file  . Transportation needs:    Medical: Not on file    Non-medical: Not on file  Tobacco Use  . Smoking status: Former Smoker    Packs/day: 0.30    Types: Cigarettes    Last attempt to quit: 07/05/2011    Years since quitting: 6.5  . Smokeless tobacco: Never Used  . Tobacco comment: Counseling  sheet given to quit smoking   Substance and Sexual Activity  . Alcohol use: No  . Drug use: No  . Sexual activity: Yes    Birth control/protection: Surgical  Lifestyle  . Physical activity:    Days per week: Not on file    Minutes per session: Not on file  . Stress: Not on file  Relationships  . Social connections:    Talks on phone: Not on file    Gets together: Not on file    Attends religious service: Not on file    Active member of club or organization: Not on file    Attends meetings of clubs or organizations: Not on file    Relationship status: Not on file  . Intimate partner violence:    Fear of current or ex partner: Not on file    Emotionally abused: Not on file    Physically abused: Not on file    Forced sexual activity: Not on file  Other Topics Concern  . Not on file  Social History Narrative   Patient is  Marital(Miguel) status: married x 12 years.  Met 25 years ago.  No abuse.       Children: 4 children (21, 13, 11, 9); no grandchildren.      Lives: with husband 4 children.      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       Education: [employer].  Only has two year RN: wanting to go back for BSN.       Tobacco; quit 07/2011.  Smoked x 20 years.       Alcohol: wine weekly 1 per week; rarely.       Exercise: none in 03/2013; some exercise 2 times per week; walking.       Seatbelt: 100%      Guns: none      Patient is right-handed.   Patient drinks one cup of coffee every morning.       Outpatient Medications Prior to Visit  Medication Sig Dispense Refill  . levothyroxine (SYNTHROID, LEVOTHROID) 50 MCG tablet TAKE 1 TABLET BY MOUTH ONCE DAILY IN THE MORNING ON AN EMPTY STOMACH    . metFORMIN (GLUCOPHAGE-XR) 750 MG 24 hr tablet Take 750 mg by mouth 2 (two) times daily with a meal.    . Multiple Vitamins-Minerals (MULTIVITAMIN ADULT PO) Take by mouth.    . omega-3 acid ethyl esters (LOVAZA) 1 g capsule Take 1 g by mouth 2 (two) times daily.    Marland Kitchen levothyroxine (SYNTHROID, LEVOTHROID) 25 MCG tablet Take 1 tablet (25 mcg total) by mouth daily before breakfast. Take on an empty stomach 1 hr before breakfast. Drink plenty of water. (Patient taking differently: Take 50 mcg by mouth daily before breakfast. Take on an empty stomach 1 hr before breakfast. Drink plenty of water.) 90 tablet 0  . metFORMIN (GLUCOPHAGE-XR) 500 MG 24 hr tablet Take 2 tablets (1,000 mg total) by mouth 2 (two) times daily at 8 am and 10 pm. (Patient taking differently: Take 750 mg by mouth 2 (two) times daily at 8 am and 10 pm. ) 360 tablet 1   No facility-administered medications prior to visit.     No Known Allergies  ROS Review of Systems  Constitutional: Negative.   HENT: Negative.   Eyes: Negative.   Respiratory: Negative.   Cardiovascular: Negative.     Gastrointestinal: Negative.   Endocrine: Negative.   Genitourinary: Negative.  Skin: Negative.   Allergic/Immunologic: Negative.   Neurological: Positive for headaches.  Hematological: Negative.   Psychiatric/Behavioral: Negative.    Objective:    Physical Exam  Constitutional: She is oriented to person, place, and time. She appears well-developed and well-nourished.  HENT:  Head: Normocephalic and atraumatic.  Eyes: Conjunctivae are normal.  Neck: Normal range of motion. Neck supple.  Cardiovascular: Normal rate, regular rhythm, normal heart sounds and intact distal pulses.  Pulmonary/Chest: Effort normal and breath sounds normal.  Abdominal: Soft. Bowel sounds are normal.  Musculoskeletal: Normal range of motion.        General: Edema (right knee) present.  Neurological: She is alert and oriented to person, place, and time. She has normal reflexes.  Skin: Skin is warm and dry.  Scattered bruises on extremities.   Psychiatric: She has a normal mood and affect. Her behavior is normal. Thought content normal.  Nursing note and vitals reviewed.   BP 126/83 (BP Location: Right Arm, Patient Position: Sitting, Cuff Size: Large)   Pulse 84   Temp 98.6 F (37 C) (Oral)   Ht 5' 8"  (1.727 m)   Wt 267 lb 3.2 oz (121.2 kg)   SpO2 96%   BMI 40.63 kg/m  Wt Readings from Last 3 Encounters:  02/04/18 267 lb 3.2 oz (121.2 kg)  11/21/17 262 lb (118.8 kg)  06/09/17 270 lb 9.6 oz (122.7 kg)     Health Maintenance Due  Topic Date Due  . OPHTHALMOLOGY EXAM  07/11/1984  . INFLUENZA VACCINE  08/04/2017  . FOOT EXAM  09/15/2017  . URINE MICROALBUMIN  09/15/2017  . HEMOGLOBIN A1C  12/10/2017    There are no preventive care reminders to display for this patient.  Lab Results  Component Value Date   TSH 7.450 (H) 06/10/2017   Lab Results  Component Value Date   WBC 7.3 02/04/2018   HGB 12.4 02/04/2018   HCT 37.3 02/04/2018   MCV 85.2 02/04/2018   PLT 366 06/10/2017   Lab  Results  Component Value Date   NA 139 06/10/2017   K 4.2 06/10/2017   CO2 27 06/10/2017   GLUCOSE 118 (H) 06/10/2017   BUN 9 06/10/2017   CREATININE 0.61 06/10/2017   BILITOT 0.3 06/10/2017   ALKPHOS 89 06/10/2017   AST 15 06/10/2017   ALT 20 06/10/2017   PROT 7.2 06/10/2017   ALBUMIN 4.4 06/10/2017   CALCIUM 9.7 06/10/2017   Lab Results  Component Value Date   CHOL 130 06/10/2017   Lab Results  Component Value Date   HDL 30 (L) 06/10/2017   Lab Results  Component Value Date   LDLCALC 69 06/10/2017   Lab Results  Component Value Date   TRIG 157 (H) 06/10/2017   Lab Results  Component Value Date   CHOLHDL 4.3 06/10/2017   Lab Results  Component Value Date   HGBA1C 8.0 (H) 06/10/2017   Assessment & Plan:    1. Bruising Platelets are stable. She will use compression hose, rest often, and elevate legs. Additional discharge instructions printed on AVS. - POCT CBC  2. Type 2 diabetes mellitus without complication, without long-term current use of insulin (HCC) Improved. Hgb A1c not at 8.0, from 9.0 on 03/2017. Continue Metformin as prescribed. She will continue to decrease foods/beverages high in sugars and carbs and follow Heart Healthy or DASH diet. Increase physical activity to at least 30 minutes cardio exercise daily.   3. Hemorrhoids, unspecified hemorrhoid type Stable.   4. Follow up  She will follow up as needed.   No orders of the defined types were placed in this encounter.  Kathe Becton,  MSN, FNP-C [facility] [place], [place] [address] [phone] Problem List Items Addressed This Visit      Cardiovascular and Mediastinum   Hemorrhoids   Relevant Medications   omega-3 acid ethyl esters (LOVAZA) 1 g capsule     Endocrine   Diabetes ([place])   Relevant Medications   metFORMIN (GLUCOPHAGE-XR) 750 MG 24 hr tablet    Other Visit Diagnoses    Bruising    -  Primary   Relevant Orders   POCT  CBC (Completed)   Follow up          No orders of the defined types were placed in this encounter.   Follow-up: No follow-ups on file.    Azzie Glatter, FNP

## 2018-02-04 NOTE — Patient Instructions (Addendum)
If you have lab work done today you will be contacted with your lab results within the next 2 weeks.  If you have not heard from [place] then please contact us. The fastest way to get your results is to register for My Chart.   IF you received an x-ray today, you will receive an invoice from [facility]. Please contact [facility] at [phone] with questions or concerns regarding your invoice.   IF you received labwork today, you will receive an invoice from [facility]. Please contact [facility] at [phone] with questions or concerns regarding your invoice.   Our billing staff will not be able to assist you with questions regarding bills from these companies.  You will be contacted with the lab results as soon as they are available. The fastest way to get your results is to activate your My Chart account. Instructions are located on the last page of this paperwork. If you have not heard from [place] regarding the results in 2 weeks, please contact this office.      Hematoma A hematoma is a collection of blood under the skin, in an organ, in a body space, in a joint space, or in other tissue. The blood can thicken (clot) to form a lump that you can see and feel. The lump is often firm and may become sore and tender. Most hematomas get better in a few days to weeks. However, some hematomas may be serious and require medical care. Hematomas can range from very small to very large. What are the causes? This condition is caused by:  A blunt or penetrating injury.  A leakage from a blood vessel under the skin.  Some medical procedures, including surgeries, such as oral surgery, face lifts, and surgeries on the joints.  Some medical conditions that cause bleeding or bruising. There may be multiple hematomas that appear in different areas of the body. What increases the risk? You are more likely to develop this condition if:  You are an older adult.  You use blood  thinners. What are the signs or symptoms?  Symptoms of this condition depend on where the hematoma is located.  Common symptoms of a hematoma that is under the skin include:  A firm lump on the body.  Pain and tenderness in the area.  Bruising. Blue, dark blue, purple-red, or yellowish skin (discoloration) may appear at the site of the hematoma if the hematoma is close to the surface of the skin. Common symptoms of a hematoma that is deep in the tissues or body spaces may be less obvious. They include:  A collection of blood in the stomach (intra-abdominal hematoma). This may cause pain in the abdomen, weakness, fainting, and shortness of breath.  A collection of blood in the head (intracranial hematoma). This may cause a headache or symptoms such as weakness, trouble speaking or understanding, or a change in consciousness. How is this diagnosed? This condition is diagnosed based on:  Your medical history.  A physical exam.  Imaging tests, such as an ultrasound or CT scan. These may be needed if your health care provider suspects a hematoma in deeper tissues or body spaces.  Blood tests. These may be needed if your health care provider believes that the hematoma is caused by a medical condition. How is this treated? Treatment for this condition depends on the cause, size, and location of the hematoma. Treatment may include:  Doing nothing. The majority of hematomas do not need treatment as many  of them go away on their own over time.  Surgery or close monitoring. This may be needed for large hematomas or hematomas that affect vital organs.  Medicines. Medicines may be given if there is an underlying medical cause for the hematoma. Follow these instructions at home: Managing pain, stiffness, and swelling   If directed, put ice on the affected area. ? Put ice in a plastic bag. ? Place a towel between your skin and the bag. ? Leave the ice on for 20 minutes, 2-3 times a day for  the first couple of days.  If directed, apply heat to the affected area after applying ice for a couple of days. Use the heat source that your health care provider recommends, such as a moist heat pack or a heating pad. ? Place a towel between your skin and the heat source. ? Leave the heat on for 20-30 minutes. ? Remove the heat if your skin turns bright red. This is especially important if you are unable to feel pain, heat, or cold. You may have a greater risk of getting burned.  Raise (elevate) the affected area above the level of your heart while you are sitting or lying down.  If told, wrap the affected area with an elastic bandage. The bandage applies pressure (compression) to the area, which may help to reduce swelling and promote healing. Do not wrap the bandage too tightly around the affected area.  If your hematoma is on a leg or foot (lower extremity) and is painful, your health care provider may recommend crutches. Use them as told by your health care provider. General instructions  Take over-the-counter and prescription medicines only as told by your health care provider.  Keep all follow-up visits as told by your health care provider. This is important. Contact a health care provider if:  You have a fever.  The swelling or discoloration gets worse.  You develop more hematomas. Get help right away if:  Your pain is worse or your pain is not controlled with medicine.  Your skin over the hematoma breaks or starts bleeding.  Your hematoma is in your chest or abdomen and you have weakness, shortness of breath, or a change in consciousness.  You have a hematoma on your scalp that is caused by a fall or injury, and you also have: ? A headache that gets worse. ? Trouble speaking or understanding speech. ? Weakness. ? Change in alertness or consciousness. Summary  A hematoma is a collection of blood under the skin, in an organ, in a body space, in a joint space, or in  other tissue.  This condition usually does not need treatment because many hematomas go away on their own over time.  Large hematomas, or those that may affect vital organs, may need surgical drainage or monitoring. If the hematoma is caused by a medical condition, medicines may be prescribed.  Get help right away if your hematoma breaks or starts to bleed, you have shortness of breath, or you have a headache or trouble speaking after a fall. This information is not intended to replace advice given to you by your health care provider. Make sure you discuss any questions you have with your health care provider. Document Released: 08/05/2003 Document Revised: 05/26/2017 Document Reviewed: 05/26/2017 Elsevier Interactive Patient Education  2019 [employer].

## 2018-04-13 ENCOUNTER — Telehealth: Payer: Self-pay | Admitting: Family Medicine

## 2018-04-13 NOTE — Telephone Encounter (Signed)
Pt called an l/m said that she is having trouble with her allergies and wants to know it she can have a rx called in for albuterol inhaler.Please advise.

## 2018-04-17 NOTE — Telephone Encounter (Signed)
Called pt left a VM advising pt to call and schedule a webex visit with dr Volanda Napoleon.

## 2018-04-19 NOTE — Telephone Encounter (Signed)
Spoke with pt states that she got her previous NP write her an Rx for Albuterol inhaler

## 2018-12-11 ENCOUNTER — Other Ambulatory Visit: Payer: Self-pay | Admitting: Nurse Practitioner

## 2018-12-11 DIAGNOSIS — E039 Hypothyroidism, unspecified: Secondary | ICD-10-CM

## 2018-12-20 ENCOUNTER — Ambulatory Visit
Admission: RE | Admit: 2018-12-20 | Discharge: 2018-12-20 | Disposition: A | Discharge status: Home / Self Care | Payer: BLUE CROSS/BLUE SHIELD | Source: Ambulatory Visit | Attending: Nurse Practitioner | Admitting: Nurse Practitioner

## 2018-12-20 DIAGNOSIS — E039 Hypothyroidism, unspecified: Secondary | ICD-10-CM | POA: Diagnosis not present

## 2019-08-01 DIAGNOSIS — E119 Type 2 diabetes mellitus without complications: Secondary | ICD-10-CM | POA: Diagnosis not present

## 2019-08-01 LAB — HM DIABETES EYE EXAM

## 2020-01-25 DIAGNOSIS — G4719 Other hypersomnia: Secondary | ICD-10-CM | POA: Diagnosis not present

## 2020-01-25 DIAGNOSIS — E119 Type 2 diabetes mellitus without complications: Secondary | ICD-10-CM | POA: Diagnosis not present

## 2020-01-25 DIAGNOSIS — E039 Hypothyroidism, unspecified: Secondary | ICD-10-CM | POA: Diagnosis not present

## 2020-02-15 DIAGNOSIS — E039 Hypothyroidism, unspecified: Secondary | ICD-10-CM | POA: Diagnosis not present

## 2020-02-15 DIAGNOSIS — G4719 Other hypersomnia: Secondary | ICD-10-CM | POA: Diagnosis not present

## 2020-02-15 DIAGNOSIS — E119 Type 2 diabetes mellitus without complications: Secondary | ICD-10-CM | POA: Diagnosis not present

## 2020-02-15 DIAGNOSIS — R0683 Snoring: Secondary | ICD-10-CM | POA: Diagnosis not present

## 2020-02-19 ENCOUNTER — Other Ambulatory Visit (HOSPITAL_BASED_OUTPATIENT_CLINIC_OR_DEPARTMENT_OTHER): Payer: Self-pay

## 2020-02-19 DIAGNOSIS — R0681 Apnea, not elsewhere classified: Secondary | ICD-10-CM

## 2020-02-19 DIAGNOSIS — R0683 Snoring: Secondary | ICD-10-CM

## 2020-02-19 DIAGNOSIS — G478 Other sleep disorders: Secondary | ICD-10-CM

## 2020-03-09 ENCOUNTER — Ambulatory Visit (HOSPITAL_BASED_OUTPATIENT_CLINIC_OR_DEPARTMENT_OTHER): Payer: Self-pay | Attending: Internal Medicine | Admitting: Internal Medicine

## 2020-03-09 ENCOUNTER — Other Ambulatory Visit: Payer: Self-pay

## 2020-03-09 VITALS — Ht 68.0 in | Wt 270.0 lb

## 2020-03-09 DIAGNOSIS — R0681 Apnea, not elsewhere classified: Secondary | ICD-10-CM

## 2020-03-09 DIAGNOSIS — R400 Somnolence: Secondary | ICD-10-CM | POA: Insufficient documentation

## 2020-03-09 DIAGNOSIS — G478 Other sleep disorders: Secondary | ICD-10-CM

## 2020-03-09 DIAGNOSIS — R0683 Snoring: Secondary | ICD-10-CM

## 2020-03-09 DIAGNOSIS — G4719 Other hypersomnia: Secondary | ICD-10-CM

## 2020-03-12 DIAGNOSIS — G4710 Hypersomnia, unspecified: Secondary | ICD-10-CM | POA: Diagnosis not present

## 2020-03-12 NOTE — Procedures (Signed)
° °  NAME: Kim Delgado DATE OF BIRTH:  [birth year] MEDICAL RECORD NUMBER [id number]  LOCATION: [facility]  PHYSICIAN: Marius Ditch  DATE OF STUDY: 03/09/2020  SLEEP STUDY TYPE: Nocturnal Polysomnogram               REFERRING PHYSICIAN: Marius Ditch, MD  EPWORTH SLEEPINESS SCORE:  10 HEIGHT: 5\' 8"  (172.7 cm)  WEIGHT: 270 lb (122.5 kg)    Body mass index is 41.05 kg/m.  NECK SIZE: 17 in.  CLINICAL INFORMATION The patient was referred to the sleep center for evaluation of excessive daytime sleepiness. She had an unexpectedly negative HSAT. Comorbidities include Diabetes.  MEDICATIONS No sleep medicine administered.Marland Kitchen  SLEEP STUDY TECHNIQUE A multi-channel overnight Polysomnography study was performed. The channels recorded and monitored were central and occipital EEG, electrooculogram (EOG), submentalis EMG (chin), nasal and oral airflow, thoracic and abdominal wall motion, anterior tibialis EMG, snore microphone, electrocardiogram, and a pulse oximetry.  TECHNICAL COMMENTS Comments added by Technician: None Comments added by Scorer: N/A  SLEEP ARCHITECTURE The study was initiated at 10:25:36 PM and terminated at 5:01:03 AM. The total recorded time was 395.4 minutes. EEG confirmed total sleep time was 327 minutes yielding a sleep efficiency of 82.7%%. Sleep onset after lights out was 7.9 minutes with a REM latency of 56.5 minutes. The patient spent 4.6%% of the night in stage N1 sleep, 69.4%% in stage N2 sleep, 0.0%% in stage N3 and 26% in REM. Wake after sleep onset (WASO) was 60.6 minutes. The Arousal Index was 4.6/hour.  RESPIRATORY PARAMETERS There were a total of 3 respiratory disturbances out of which 0 were apneas ( 0 obstructive, 0 mixed, 0 central) and 3 hypopneas. The apnea/hypopnea index (AHI) was 0.6 events/hour. The central sleep apnea index was 0 events/hour. The REM AHI was 0.7 events/hour and NREM AHI was 0.5 events/hour. The supine AHI was N/A  events/hour and the non supine AHI was 0.6 events/hour. Respiratory disturbance index (RDI) was 2 events/hour overall and 1.4 events/hour in REM sleep. Respiratory disturbances were associated with oxygen desaturation down to a nadir of 91.0% during sleep. The mean oxygen saturation during the study was 94.6%. The cumulative time under 88% oxygen saturation was 0 minutes.  LEG MOVEMENT DATA The total leg movements were 0 with a resulting leg movement index of 0.0/hr . Associated arousal with leg movement index was 0.0/hr.  CARDIAC DATA The underlying cardiac rhythm was most consistent with sinus rhythm. Mean heart rate during sleep was 75.2 bpm. Additional rhythm abnormalities include None.  IMPRESSIONS - No significant sleep disordered breathing - No significant Oxygen Desaturation - No significant periodic leg movements(PLMs) during sleep. However, no significant associated arousals.  DIAGNOSIS - Excessive daytime sleepiness   RECOMMENDATIONS - No treatment for sleep disordered breathing necessary based on this study. Sleep efficiency was 83% so this should be considered a very good test.   Marius Ditch Sleep specialist, American Board of Internal Medicine  ELECTRONICALLY SIGNED ON:  03/12/2020, 7:18 PM [place] PH: [phone]   FX: [phone] [place]

## 2020-04-23 ENCOUNTER — Telehealth: Payer: Self-pay | Admitting: Family Medicine

## 2020-04-23 NOTE — Telephone Encounter (Signed)
ok 

## 2020-04-23 NOTE — Telephone Encounter (Signed)
Patient would like to do a TOC from Dr. Volanda Napoleon to Wilfred Lacy, NP. Since she is still within the 3 year window I was told that the transfer would have to be approved. Please advise if this is okay.  Thank you

## 2020-04-25 NOTE — Telephone Encounter (Signed)
Okay 

## 2020-06-11 ENCOUNTER — Encounter: Payer: Self-pay | Admitting: Nurse Practitioner

## 2020-06-11 ENCOUNTER — Ambulatory Visit (INDEPENDENT_AMBULATORY_CARE_PROVIDER_SITE_OTHER): Payer: BC Managed Care – PPO | Admitting: Nurse Practitioner

## 2020-06-11 ENCOUNTER — Other Ambulatory Visit: Payer: Self-pay

## 2020-06-11 ENCOUNTER — Other Ambulatory Visit (HOSPITAL_COMMUNITY)
Admission: RE | Admit: 2020-06-11 | Discharge: 2020-06-11 | Disposition: A | Discharge status: Home / Self Care | Payer: BC Managed Care – PPO | Source: Ambulatory Visit | Attending: Nurse Practitioner | Admitting: Nurse Practitioner

## 2020-06-11 VITALS — BP 124/88 | HR 88 | Temp 97.8°F | Ht 68.0 in | Wt 276.8 lb

## 2020-06-11 DIAGNOSIS — Z113 Encounter for screening for infections with a predominantly sexual mode of transmission: Secondary | ICD-10-CM | POA: Insufficient documentation

## 2020-06-11 DIAGNOSIS — E782 Mixed hyperlipidemia: Secondary | ICD-10-CM

## 2020-06-11 DIAGNOSIS — Z1231 Encounter for screening mammogram for malignant neoplasm of breast: Secondary | ICD-10-CM | POA: Diagnosis not present

## 2020-06-11 DIAGNOSIS — Z90710 Acquired absence of both cervix and uterus: Secondary | ICD-10-CM | POA: Insufficient documentation

## 2020-06-11 DIAGNOSIS — Z0001 Encounter for general adult medical examination with abnormal findings: Secondary | ICD-10-CM

## 2020-06-11 DIAGNOSIS — F4320 Adjustment disorder, unspecified: Secondary | ICD-10-CM | POA: Insufficient documentation

## 2020-06-11 DIAGNOSIS — Z124 Encounter for screening for malignant neoplasm of cervix: Secondary | ICD-10-CM

## 2020-06-11 DIAGNOSIS — F339 Major depressive disorder, recurrent, unspecified: Secondary | ICD-10-CM | POA: Insufficient documentation

## 2020-06-11 DIAGNOSIS — F321 Major depressive disorder, single episode, moderate: Secondary | ICD-10-CM

## 2020-06-11 DIAGNOSIS — E1169 Type 2 diabetes mellitus with other specified complication: Secondary | ICD-10-CM

## 2020-06-11 DIAGNOSIS — E039 Hypothyroidism, unspecified: Secondary | ICD-10-CM | POA: Diagnosis not present

## 2020-06-11 DIAGNOSIS — Z8742 Personal history of other diseases of the female genital tract: Secondary | ICD-10-CM | POA: Insufficient documentation

## 2020-06-11 LAB — T4, FREE: Free T4: 1.1 ng/dL (ref 0.60–1.60)

## 2020-06-11 LAB — LIPID PANEL
Cholesterol: 138 mg/dL (ref 0–200)
HDL: 33.3 mg/dL — ABNORMAL LOW (ref >39.00)
LDL Cholesterol: 67 mg/dL (ref 0–99)
NonHDL: 104.37
Total CHOL/HDL Ratio: 4
Triglycerides: 187 mg/dL — ABNORMAL HIGH (ref 0.0–149.0)
VLDL: 37.4 mg/dL (ref 0.0–40.0)

## 2020-06-11 LAB — COMPREHENSIVE METABOLIC PANEL
ALT: 30 U/L (ref 0–35)
AST: 18 U/L (ref 0–37)
Albumin: 4.4 g/dL (ref 3.5–5.2)
Alkaline Phosphatase: 98 U/L (ref 39–117)
BUN: 7 mg/dL (ref 6–23)
CO2: 27 mEq/L (ref 19–32)
Calcium: 9.2 mg/dL (ref 8.4–10.5)
Chloride: 101 mEq/L (ref 96–112)
Creatinine, Ser: 0.64 mg/dL (ref 0.40–1.20)
GFR: 106.36 mL/min (ref >60.00)
Glucose, Bld: 202 mg/dL — ABNORMAL HIGH (ref 70–99)
Potassium: 3.8 mEq/L (ref 3.5–5.1)
Sodium: 138 mEq/L (ref 135–145)
Total Bilirubin: 0.5 mg/dL (ref 0.2–1.2)
Total Protein: 7.1 g/dL (ref 6.0–8.3)

## 2020-06-11 LAB — TSH: TSH: 3.34 u[IU]/mL (ref 0.35–4.50)

## 2020-06-11 LAB — CBC WITH DIFFERENTIAL/PLATELET
Basophils Absolute: 0 10*3/uL (ref 0.0–0.1)
Basophils Relative: 0.6 % (ref 0.0–3.0)
Eosinophils Absolute: 0.4 10*3/uL (ref 0.0–0.7)
Eosinophils Relative: 5.1 % — ABNORMAL HIGH (ref 0.0–5.0)
HCT: 39.6 % (ref 36.0–46.0)
Hemoglobin: 13 g/dL (ref 12.0–15.0)
Lymphocytes Relative: 37.9 % (ref 12.0–46.0)
Lymphs Abs: 2.6 10*3/uL (ref 0.7–4.0)
MCHC: 32.8 g/dL (ref 30.0–36.0)
MCV: 85.5 fl (ref 78.0–100.0)
Monocytes Absolute: 0.3 10*3/uL (ref 0.1–1.0)
Monocytes Relative: 5.1 % (ref 3.0–12.0)
Neutro Abs: 3.5 10*3/uL (ref 1.4–7.7)
Neutrophils Relative %: 51.3 % (ref 43.0–77.0)
Platelets: 338 10*3/uL (ref 150.0–400.0)
RBC: 4.63 Mil/uL (ref 3.87–5.11)
RDW: 14.8 % (ref 11.5–15.5)
WBC: 6.9 10*3/uL (ref 4.0–10.5)

## 2020-06-11 LAB — MICROALBUMIN / CREATININE URINE RATIO
Creatinine,U: 30 mg/dL
Microalb Creat Ratio: 3.7 mg/g (ref 0.0–30.0)
Microalb, Ur: 1.1 mg/dL (ref 0.0–1.9)

## 2020-06-11 LAB — HEMOGLOBIN A1C: Hgb A1c MFr Bld: 10 % — ABNORMAL HIGH (ref 4.6–6.5)

## 2020-06-11 MED ORDER — TRULICITY 1.5 MG/0.5ML ~~LOC~~ SOAJ: 1.5000 mg | SUBCUTANEOUS | 5 refills | Status: DC

## 2020-06-11 MED ORDER — EUTHYROX 150 MCG PO TABS: 150.0000 ug | ORAL_TABLET | Freq: Every day | ORAL | 1 refills | Status: DC

## 2020-06-11 MED ORDER — METFORMIN HCL 1000 MG PO TABS: 1000.0000 mg | ORAL_TABLET | Freq: Two times a day (BID) | ORAL | 1 refills | Status: DC

## 2020-06-11 NOTE — Assessment & Plan Note (Addendum)
Abnormal lipid panel due to elevated triglyceride and low HDL: dietary changes and daily exercise will help with these numbers Repeat in 88months Lipid Panel     Component Value Date/Time   CHOL 138 06/11/2020 1137   CHOL 130 06/10/2017 0815   TRIG 187.0 (H) 06/11/2020 1137   HDL 33.30 (L) 06/11/2020 1137   HDL 30 (L) 06/10/2017 0815   CHOLHDL 4 06/11/2020 1137   VLDL 37.4 06/11/2020 1137   LDLCALC 67 06/11/2020 1137   LDLCALC 69 06/10/2017 0815   LABVLDL 31 06/10/2017 0815

## 2020-06-11 NOTE — Assessment & Plan Note (Signed)
Stable TSH and T4 Refill sent

## 2020-06-11 NOTE — Assessment & Plan Note (Signed)
Uncontrolled Dm with hgbA1c at 10: maintain metformin at 1000mg  BID and increase tulicity to 1mg  weekly. Maintain dietary changes as discussed Normal urine microalbumin BP at goal  LDL at goal Bring glucose reading to next office visit

## 2020-06-11 NOTE — Progress Notes (Signed)
Subjective:    Patient ID: Kim Delgado, female    DOB: 1975/07/19, 45 y.o.   MRN: [id number]  Patient presents today for CPE and eval of chronic conditions  HPI Diabetes (Kim Delgado) Uncontrolled Dm with hgbA1c at 10: maintain metformin at 1033m BID and increase tulicity to 155mweekly. Maintain dietary changes as discussed Normal urine microalbumin BP at goal  LDL at goal Bring glucose reading to next office visit  Acquired hypothyroidism Stable TSH and T4 Refill sent  Depression, recurrent ([place] need for medication Agreed to psychology referral  Hyperlipidemia Abnormal lipid panel due to elevated triglyceride and low HDL: dietary changes and daily exercise will help with these numbers Repeat in 109m24monthipid Panel     Component Value Date/Time   CHOL 138 06/11/2020 1137   CHOL 130 06/10/2017 0815   TRIG 187.0 (H) 06/11/2020 1137   HDL 33.30 (L) 06/11/2020 1137   HDL 30 (L) 06/10/2017 0815   CHOLHDL 4 06/11/2020 1137   VLDL 37.4 06/11/2020 1137   LDLCALC 67 06/11/2020 1137   LDLCALC 69 06/10/2017 0815   LABVLDL 31 06/10/2017 0815    Morbid obesity (Kim Delgado referral to weight management clinic and nutritionist Advised about need for DASH diet and exercise. F/up in 51mo15monthexual History (orientation,birth control, marital status, STD):married, sexually active agreed to wet prep only for STD screen, s/p hysterectomy with oophorectomy, cervix present  Depression/Suicide: Depression screen PHQ [facility] 06/11/2020 02/04/2018 06/09/2017 03/31/2017 03/31/2017 12/22/2016 09/15/2016  Decreased Interest 1 0 0 0 0 0 0  Down, Depressed, Hopeless 1 0 0 0 0 0 0  PHQ - 2 Score 2 0 0 0 0 0 0  Altered sleeping 2 - - - - - -  Tired, decreased energy 2 - - - - - -  Change in appetite 3 - - - - - -  Feeling bad or failure about yourself  1 - - - - - -  Trouble concentrating 1 - - - - - -  Moving slowly or fidgety/restless 1 - - - - - -  Suicidal thoughts 0 - - - - - -  PHQ-9  Score 12 - - - - - -  Difficult doing work/chores Not difficult at all - - - - - -   GAD 7 : Generalized Anxiety Score 06/11/2020  Nervous, Anxious, on Edge 1  Control/stop worrying 1  Worry too much - different things 1  Trouble relaxing 1  Restless 0  Easily annoyed or irritable 1  Afraid - awful might happen 0  Total GAD 7 Score 5  Anxiety Difficulty Not difficult at all   Vision:up to date  Dental:uptodate  Immunizations: (TDAP, Hep C screen, Pneumovax, Influenza, zoster)  Health Maintenance  Topic Date Due  . Pneumococcal Vaccination (1 of 2 - PPSV23) Never done  . Eye exam for diabetics  Never done  . Hepatitis C Screening: USPSTF Recommendation to screen - Ages [id number]  Never done  . Complete foot exam   09/15/2017  . Pap Smear  03/31/2020  . Flu Shot  08/04/2020  . Hemoglobin A1C  12/11/2020  . Colon Cancer Screening  12/23/2020  . Urine Protein Check  06/11/2021  . Tetanus Vaccine  04/18/2022  . Zoster (Shingles) Vaccine (1 of 2) 07/11/2024  . Pneumococcal vaccine  Completed  . COVID-19 Vaccine  Completed  . HIV Screening  Completed  . HPV Vaccine  Aged Out   Fall Risk: Fall Risk  06/11/2020  02/04/2018 06/09/2017 03/31/2017 03/31/2017 12/22/2016 09/15/2016  Falls in the past year? 0 0 _0   Comment - - - - - - -  Number falls in past yr: 0 0 - - - - -  Injury with Fall? 0 1 - - - - -  Risk for fall due to : No Fall Risks - - - - - -  Follow up Falls evaluation completed Falls evaluation completed - - - - -   Medications and allergies reviewed with patient and updated if appropriate.  Patient Active Problem List   Diagnosis Date Noted  . Depression, recurrent (Kim Delgado) 06/11/2020  . S/P hysterectomy with oophorectomy 06/11/2020  . Hx of endometriosis 06/11/2020  . Acquired hypothyroidism 11/24/2017  . Hyperlipidemia 09/25/2015  . 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 Morbid obesity ([place]) 08/06/2013  . Hemorrhoids 06/23/2012  . Diabetes (Kim Delgado)  03/16/2012  . Nicotine addiction 10/19/2011  . Rectal bleeding 12/23/2010    Current Outpatient Medications on File Prior to Visit  Medication Sig Dispense Refill  . Black Currant Seed Oil 500 MG CAPS Take by mouth.    . loratadine (CLARITIN) 10 MG tablet Take 10 mg by mouth daily.    . Multiple Vitamins-Minerals (MULTIVITAMIN ADULT PO) Take by mouth.    Marland Kitchen OVER THE COUNTER MEDICATION      No current facility-administered medications on file prior to visit.    Past Medical History:  Diagnosis Date  . Allergy   . Anemia   . Bronchitis with bronchospasm   . Diabetes mellitus without complication ([place])   . Internal hemorrhoids   . Obesity   . Papilloma of breast   . Rectal bleeding     Past Surgical History:  Procedure Laterality Date  . ABDOMINAL HYSTERECTOMY     BSO; CERVIX INTACT; DUB; fibroids; endometriosis.  Kim Delgado.  Marland Kitchen BREAST SURGERY    . CYSTOSCOPY    . TUBAL LIGATION      Social History   Socioeconomic History  . Marital status: Married    Spouse name: Kim Delgado  . Number of children: 4  . Years of education: College  . Highest education level: Not on file  Occupational History  . Occupation: LPN    Employer: Kim Delgado    Comment: 3rd shift at [employer]  . Smoking status: Former Smoker    Packs/day: 0.30    Types: Cigarettes    Quit date: 07/05/2011    Years since quitting: 8.9  . Smokeless tobacco: Never Used  . Tobacco comment: Counseling sheet given to quit smoking   Vaping Use  . Vaping Use: Never used  Substance and Sexual Activity  . Alcohol use: No  . Drug use: No  . Sexual activity: Yes    Birth control/protection: Surgical  Other Topics Concern  . Not on file  Social History Narrative   Patient is Marital(Kim Delgado) status: married x 12 years.  Met 25 years ago.  No abuse.       Children: 4 children (21, 13, 11, 9); no grandchildren.      Lives: with husband 4 children.      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       Education: [employer].  Only has two year RN: wanting to go back for BSN.       Tobacco; quit 07/2011.  Smoked x 20 years.       Alcohol: wine weekly  1 per week; rarely.       Exercise: none in 03/2013; some exercise 2 times per week; walking.       Seatbelt: 100%      Guns: none      Patient is right-handed.   Patient drinks one cup of coffee every morning.      Social Determinants of Health   Financial Resource Strain: Not on file  Food Insecurity: Not on file  Transportation Needs: Not on file  Physical Activity: Not on file  Stress: Not on file  Social Connections: Not on file    Family History  Problem Relation Age of Onset  . Transient ischemic attack Mother 59       x 2  . Hypertension Mother   . Diabetes Mother        diet controlled  . Stroke Mother   . Glaucoma Maternal Grandmother   . Diabetes Maternal Grandmother   . Drug abuse Father        iv drug abuse  . Breast cancer Maternal Aunt   . Throat cancer Maternal Aunt   . Lung cancer Paternal Aunt   . Colon cancer Neg Hx        Review of Systems  Constitutional: Negative for fever, malaise/fatigue and weight loss.  HENT: Negative for congestion and sore throat.   Eyes:       Negative for visual changes  Respiratory: Negative for cough and shortness of breath.   Cardiovascular: Negative for chest pain, palpitations and leg swelling.  Gastrointestinal: Negative for blood in stool, constipation, diarrhea and heartburn.  Genitourinary: Negative for dysuria, frequency and urgency.  Musculoskeletal: Negative for falls, joint pain and myalgias.  Skin: Negative for rash.  Neurological: Negative for dizziness, sensory change and headaches.  Endo/Heme/Allergies: Does not bruise/bleed easily.  Psychiatric/Behavioral: Negative for depression, substance abuse and suicidal ideas. The patient is not nervous/anxious.    Objective:   Vitals:   06/11/20 1047  BP: 124/88   Pulse: 88  Temp: 97.8 F (36.6 C)  SpO2: 98%   Body mass index is 42.09 kg/m.  Physical Examination:  Physical Exam Vitals reviewed. Exam conducted with a chaperone present.  Constitutional:      General: She is not in acute distress.    Appearance: She is obese.  HENT:     Right Ear: Tympanic membrane, ear canal and external ear normal.     Left Ear: Tympanic membrane, ear canal and external ear normal.  Eyes:     General: No scleral icterus.    Extraocular Movements: Extraocular movements intact.     Conjunctiva/sclera: Conjunctivae normal.  Neck:     Thyroid: No thyromegaly.  Cardiovascular:     Rate and Rhythm: Normal rate and regular rhythm.     Pulses: Normal pulses.     Heart sounds: Normal heart sounds.  Pulmonary:     Effort: Pulmonary effort is normal.     Breath sounds: Normal breath sounds.  Chest:  Breasts: Breasts are symmetrical.     Right: No mass, nipple discharge, skin change, axillary adenopathy or supraclavicular adenopathy.     Left: No mass, nipple discharge, skin change, axillary adenopathy or supraclavicular adenopathy.    Abdominal:     General: Bowel sounds are normal. There is no distension.     Palpations: Abdomen is soft. There is no mass.     Tenderness: There is no abdominal tenderness.     Hernia: There is no hernia in the left  inguinal area or right inguinal area.  Genitourinary:    Labia:        Right: No rash or tenderness.        Left: No rash or tenderness.      Vagina: Normal. No vaginal discharge.     Cervix: Normal.     Uterus: Absent.      Adnexa: Right adnexa normal and left adnexa normal.  Musculoskeletal:        General: No tenderness. Normal range of motion.     Cervical back: Normal range of motion and neck supple.     Right lower leg: No edema.     Left lower leg: No edema.  Lymphadenopathy:     Cervical: No cervical adenopathy.     Upper Body:     Right upper body: No supraclavicular, axillary or pectoral  adenopathy.     Left upper body: No supraclavicular, axillary or pectoral adenopathy.     Lower Body: No right inguinal adenopathy. No left inguinal adenopathy.  Skin:    General: Skin is warm and dry.  Neurological:     Mental Status: She is alert and oriented to person, place, and time.  Psychiatric:        Mood and Affect: Mood normal.        Behavior: Behavior normal.        Thought Content: Thought content normal.        Judgment: Judgment normal.    ASSESSMENT and PLAN: This visit occurred during the SARS-CoV-2 public health emergency.  Safety protocols were in place, including screening questions prior to the visit, additional usage of staff PPE, and extensive cleaning of exam room while observing appropriate contact time as indicated for disinfecting solutions.   Kim Delgado was seen today for establish care.  Diagnoses and all orders for this visit:  Encounter for preventative adult health care exam with abnormal findings -     CBC with Differential/Platelet -     Comprehensive metabolic panel  Acquired hypothyroidism -     T4, free -     TSH -     EUTHYROX 150 MCG tablet; Take 1 tablet (150 mcg total) by mouth daily before breakfast.  Mixed hyperlipidemia -     Lipid panel  Type 2 diabetes mellitus with other specified complication, without long-term current use of insulin (HCC) -     Hemoglobin A1c -     Microalbumin / creatinine urine ratio -     Dulaglutide (TRULICITY) 1.5 EF/0.0FH SOPN; Inject 1.5 mg into the skin once a week. -     metFORMIN (GLUCOPHAGE) 1000 MG tablet; Take 1 tablet (1,000 mg total) by mouth 2 (two) times daily with a meal.  Current moderate episode of major depressive disorder without prior episode ([place]) -     Ambulatory referral to Psychology  Encounter for screening mammogram for malignant neoplasm of breast -     MM 3D SCREEN BREAST BILATERAL; Future  Encounter for Papanicolaou smear for cervical cancer screening -     Cytology - PAP( [facility])  Screen for STD (sexually transmitted disease) -     Cervicovaginal ancillary only( [place])  Morbid obesity ([place])      Problem List Items Addressed This Visit      Endocrine   Acquired hypothyroidism    Stable TSH and T4 Refill sent      Relevant Medications   EUTHYROX 150 MCG tablet   Other Relevant Orders   T4,  free (Completed)   TSH (Completed)   Diabetes ([place])    Uncontrolled Dm with hgbA1c at 10: maintain metformin at 1064m BID and increase tulicity to 133mweekly. Maintain dietary changes as discussed Normal urine microalbumin BP at goal  LDL at goal Bring glucose reading to next office visit      Relevant Medications   Dulaglutide (TRULICITY) 1.5 MGOM/6.0OKOPN   metFORMIN (GLUCOPHAGE) 1000 MG tablet   Other Relevant Orders   Hemoglobin A1c (Completed)   Microalbumin / creatinine urine ratio (Completed)     Other   Depression, recurrent (HCC)    Declined need for medication Agreed to psychology referral      Hyperlipidemia    Abnormal lipid panel due to elevated triglyceride and low HDL: dietary changes and daily exercise will help with these numbers Repeat in 71m571monthipid Panel     Component Value Date/Time   CHOL 138 06/11/2020 1137   CHOL 130 06/10/2017 0815   TRIG 187.0 (H) 06/11/2020 1137   HDL 33.30 (L) 06/11/2020 1137   HDL 30 (L) 06/10/2017 0815   CHOLHDL 4 06/11/2020 1137   VLDL 37.4 06/11/2020 1137   LDLCALC 67 06/11/2020 1137   LDLCALC 69 06/10/2017 0815   LABVLDL 31 06/10/2017 0815        Relevant Orders   Lipid panel (Completed)   Morbid obesity ([place]  Declined referral to weight management clinic and nutritionist Advised about need for DASH diet and exercise. F/up in 15mo29month   Relevant Medications   Dulaglutide (TRULICITY) 1.5 MG/0HT/9.7FSN   metFORMIN (GLUCOPHAGE) 1000 MG tablet    Other Visit Diagnoses    Encounter for preventative adult health care exam with abnormal findings    -  Primary    Relevant Orders   CBC with Differential/Platelet (Completed)   Comprehensive metabolic panel (Completed)   Encounter for screening mammogram for malignant neoplasm of breast       Relevant Orders   MM 3D SCREEN BREAST BILATERAL   Encounter for Papanicolaou smear for cervical cancer screening       Relevant Orders   [facility] - PAP( [facility] for STD (sexually transmitted disease)       Relevant Orders   [facility]inal [facility]illary only( [place]   Follow up: Return in about 3 months (around 09/11/2020) for DM.  CharWilfred Lacy

## 2020-06-11 NOTE — Assessment & Plan Note (Signed)
Declined need for medication Agreed to psychology referral

## 2020-06-11 NOTE — Patient Instructions (Addendum)
Go to lab for blood draw and urine collection.  You will be contacted to schedule appt for mammogram.  Preventive Care 87-46 Years Old, Female Preventive care refers to lifestyle choices and visits with your health care provider that can promote health and wellness. This includes:  A yearly physical exam. This is also called an annual wellness visit.  Regular dental and eye exams.  Immunizations.  Screening for certain conditions.  Healthy lifestyle choices, such as: ? Eating a healthy diet. ? Getting regular exercise. ? Not using drugs or products that contain nicotine and tobacco. ? Limiting alcohol use. What can I expect for my preventive care visit? Physical exam Your health care provider will check your:  Height and weight. These may be used to calculate your BMI (body mass index). BMI is a measurement that tells if you are at a healthy weight.  Heart rate and blood pressure.  Body temperature.  Skin for abnormal spots. Counseling Your health care provider may ask you questions about your:  Past medical problems.  Family's medical history.  Alcohol, tobacco, and drug use.  Emotional well-being.  Home life and relationship well-being.  Sexual activity.  Diet, exercise, and sleep habits.  Work and work Statistician.  Access to firearms.  Method of birth control.  Menstrual cycle.  Pregnancy history. What immunizations do I need? Vaccines are usually given at various ages, according to a schedule. Your health care provider will recommend vaccines for you based on your age, medical history, and lifestyle or other factors, such as travel or where you work.   What tests do I need? Blood tests  Lipid and cholesterol levels. These may be checked every 5 years, or more often if you are over 11 years old.  Hepatitis C test.  Hepatitis B test. Screening  Lung cancer screening. You may have this screening every year starting at age 73 if you have a  30-pack-year history of smoking and currently smoke or have quit within the past 15 years.  Colorectal cancer screening. ? All adults should have this screening starting at age 15 and continuing until age 24. ? Your health care provider may recommend screening at age 46 if you are at increased risk. ? You will have tests every 1-10 years, depending on your results and the type of screening test.  Diabetes screening. ? This is done by checking your blood sugar (glucose) after you have not eaten for a while (fasting). ? You may have this done every 1-3 years.  Mammogram. ? This may be done every 1-2 years. ? Talk with your health care provider about when you should start having regular mammograms. This may depend on whether you have a family history of breast cancer.  BRCA-related cancer screening. This may be done if you have a family history of breast, ovarian, tubal, or peritoneal cancers.  Pelvic exam and Pap test. ? This may be done every 3 years starting at age 47. ? Starting at age 40, this may be done every 5 years if you have a Pap test in combination with an HPV test. Other tests  STD (sexually transmitted disease) testing, if you are at risk.  Bone density scan. This is done to screen for osteoporosis. You may have this scan if you are at high risk for osteoporosis. Talk with your health care provider about your test results, treatment options, and if necessary, the need for more tests. Follow these instructions at home: Eating and drinking  Eat a diet  that includes fresh fruits and vegetables, whole grains, lean protein, and low-fat dairy products.  Take vitamin and mineral supplements as recommended by your health care provider.  Do not drink alcohol if: ? Your health care provider tells you not to drink. ? You are pregnant, may be pregnant, or are planning to become pregnant.  If you drink alcohol: ? Limit how much you have to 0-1 drink a day. ? Be aware of how much  alcohol is in your drink. In the [place] one drink equals one 12 oz bottle of beer (355 mL), one 5 oz glass of wine (148 mL), or one 1 oz glass of hard liquor (44 mL).   Lifestyle  Take daily care of your teeth and gums. Brush your teeth every morning and night with fluoride toothpaste. Floss one time each day.  Stay active. Exercise for at least 30 minutes 5 or more days each week.  Do not use any products that contain nicotine or tobacco, such as cigarettes, e-cigarettes, and chewing tobacco. If you need help quitting, ask your health care provider.  Do not use drugs.  If you are sexually active, practice safe sex. Use a condom or other form of protection to prevent STIs (sexually transmitted infections).  If you do not wish to become pregnant, use a form of birth control. If you plan to become pregnant, see your health care provider for a prepregnancy visit.  If told by your health care provider, take low-dose aspirin daily starting at age 83.  Find healthy ways to cope with stress, such as: ? Meditation, yoga, or listening to music. ? Journaling. ? Talking to a trusted person. ? Spending time with friends and family. Safety  Always wear your seat belt while driving or riding in a vehicle.  Do not drive: ? If you have been drinking alcohol. Do not ride with someone who has been drinking. ? When you are tired or distracted. ? While texting.  Wear a helmet and other protective equipment during sports activities.  If you have firearms in your house, make sure you follow all gun safety procedures. What's next?  Visit your health care provider once a year for an annual wellness visit.  Ask your health care provider how often you should have your eyes and teeth checked.  Stay up to date on all vaccines. This information is not intended to replace advice given to you by your health care provider. Make sure you discuss any questions you have with your health care  provider. Document Revised: 09/25/2019 Document Reviewed: 09/01/2017 Elsevier Patient Education  2021 [employer].

## 2020-06-11 NOTE — Assessment & Plan Note (Signed)
Declined referral to weight management clinic and nutritionist Advised about need for DASH diet and exercise. F/up in 60months

## 2020-06-12 LAB — CYTOLOGY - PAP
Comment: NEGATIVE
Diagnosis: NEGATIVE
High risk HPV: NEGATIVE

## 2020-06-12 LAB — CERVICOVAGINAL ANCILLARY ONLY
Bacterial Vaginitis (gardnerella): NEGATIVE
Candida Glabrata: NEGATIVE
Candida Vaginitis: NEGATIVE
Chlamydia: NEGATIVE
Comment: NEGATIVE
Comment: NEGATIVE
Comment: NEGATIVE
Comment: NEGATIVE
Comment: NEGATIVE
Comment: NORMAL
Neisseria Gonorrhea: NEGATIVE
Trichomonas: NEGATIVE

## 2020-06-12 NOTE — Progress Notes (Signed)
Normal results

## 2020-06-17 ENCOUNTER — Ambulatory Visit: Payer: BC Managed Care – PPO | Admitting: Nurse Practitioner

## 2020-09-20 ENCOUNTER — Other Ambulatory Visit: Payer: Self-pay | Admitting: Nurse Practitioner

## 2020-09-20 DIAGNOSIS — E1169 Type 2 diabetes mellitus with other specified complication: Secondary | ICD-10-CM

## 2020-10-06 DIAGNOSIS — J019 Acute sinusitis, unspecified: Secondary | ICD-10-CM | POA: Diagnosis not present

## 2020-10-06 DIAGNOSIS — R21 Rash and other nonspecific skin eruption: Secondary | ICD-10-CM | POA: Diagnosis not present

## 2020-11-25 ENCOUNTER — Other Ambulatory Visit: Payer: Self-pay | Admitting: Nurse Practitioner

## 2020-11-25 DIAGNOSIS — E1169 Type 2 diabetes mellitus with other specified complication: Secondary | ICD-10-CM

## 2020-12-03 NOTE — Telephone Encounter (Signed)
Chart supports Rx Last seen 06/2020 No f/u scheduled, LVM information patient she needs a f/u appointment in order to continue receiving refills.

## 2021-01-04 DIAGNOSIS — K635 Polyp of colon: Secondary | ICD-10-CM

## 2021-01-04 HISTORY — DX: Polyp of colon: K63.5

## 2021-01-07 ENCOUNTER — Ambulatory Visit (INDEPENDENT_AMBULATORY_CARE_PROVIDER_SITE_OTHER): Payer: BC Managed Care – PPO

## 2021-01-07 ENCOUNTER — Encounter (HOSPITAL_COMMUNITY): Payer: Self-pay | Admitting: Emergency Medicine

## 2021-01-07 ENCOUNTER — Ambulatory Visit (HOSPITAL_COMMUNITY)
Admission: EM | Admit: 2021-01-07 | Discharge: 2021-01-07 | Disposition: A | Discharge status: Home / Self Care | Payer: BC Managed Care – PPO | Attending: Physician Assistant | Admitting: Physician Assistant

## 2021-01-07 ENCOUNTER — Other Ambulatory Visit: Payer: Self-pay

## 2021-01-07 DIAGNOSIS — M5410 Radiculopathy, site unspecified: Secondary | ICD-10-CM | POA: Diagnosis not present

## 2021-01-07 DIAGNOSIS — M5412 Radiculopathy, cervical region: Secondary | ICD-10-CM

## 2021-01-07 DIAGNOSIS — R350 Frequency of micturition: Secondary | ICD-10-CM | POA: Diagnosis not present

## 2021-01-07 DIAGNOSIS — M542 Cervicalgia: Secondary | ICD-10-CM | POA: Diagnosis not present

## 2021-01-07 DIAGNOSIS — M79601 Pain in right arm: Secondary | ICD-10-CM | POA: Diagnosis not present

## 2021-01-07 LAB — POC URINE PREG, ED: Preg Test, Ur: NEGATIVE

## 2021-01-07 LAB — POCT URINALYSIS DIPSTICK, ED / UC
Bilirubin Urine: NEGATIVE
Glucose, UA: 250 mg/dL — AB
Hgb urine dipstick: NEGATIVE
Ketones, ur: 15 mg/dL — AB
Leukocytes,Ua: NEGATIVE
Nitrite: NEGATIVE
Protein, ur: 30 mg/dL — AB
Specific Gravity, Urine: >=1.03 (ref 1.005–1.030)
Urobilinogen, UA: 0.2 mg/dL (ref 0.0–1.0)
pH: 5 (ref 5.0–8.0)

## 2021-01-07 MED ORDER — NAPROXEN 375 MG PO TABS
375.0000 mg | ORAL_TABLET | Freq: Two times a day (BID) | ORAL | 0 refills | Status: DC
Start: 2021-01-07 — End: 2021-03-26

## 2021-01-07 MED ORDER — GABAPENTIN 100 MG PO CAPS: 100.0000 mg | ORAL_CAPSULE | Freq: Every evening | ORAL | 0 refills | Status: DC | PRN

## 2021-01-07 MED ORDER — TIZANIDINE HCL 4 MG PO TABS: 4.0000 mg | ORAL_TABLET | Freq: Three times a day (TID) | ORAL | 0 refills | Status: DC | PRN

## 2021-01-07 NOTE — ED Triage Notes (Signed)
Swelling to right forehead is a chronic issue-gets larger and gets smaller randomly.  Started having headaches a few weeks ago.   Two weeks ago felt like she was having uti symptoms with inability to empty bladder.  Took azo and cranberry juice.    Took 2 days of cipro. ( [facility]) and saw no improvement with inability to empty bladder.  Denies burning or pain with urination    Since Sunday started having sensation of feeling like on fire, right arm, right lower back, right thigh, right torso.

## 2021-01-07 NOTE — ED Provider Notes (Signed)
[facility]    CSN: [id number] Arrival date & time: 01/07/21  1003      History   Chief Complaint Chief Complaint  Patient presents with   Headache    HPI Kim Delgado is a 46 y.o. female.   Patient presents today with several concerns.  Her primary concern today is worsening right arm pain.  She reports that intermittently for many years she has had a burning sensation and reports in her body.  This improved significantly following hysterectomy and treatment of endometriosis until recently.  Over the past several days she has had intermittent right arm and leg burning/pain this has become constant in her right arm prompting evaluation today.  She denies any weakness but reports difficulty performing daily activities as result of pain.  Pain is rated 10 on a 0-10 pain scale, localized to right arm with radiation into neck, described as intense burning, no alleviating factors identified.  She has tried ice as well as ibuprofen which provides only temporary relief in her symptoms.  She does report intermittent headaches and has seen the nurse practitioner at her job but states these have significantly improved and are not her primary concern.  She denies personal or family history of multiple sclerosis or neurological condition.  She denies injury or increase in activity prior to symptom onset.  She denies history of malignancy or previous spinal surgery.  In addition, she reports several days ago she had urinary urgency.  Denies any associated dysuria, frequency, abdominal pain, nausea, vomiting, fever (did have burning sensation in her body but does not believe this is related to fever).  She initially took some Azo and cranberry pills which provided some relief of symptoms until recurrence several days ago.  He took some ciprofloxacin from a friend's prescription but this did not provide any relief of symptoms.  Denies history of recurrent UTI, nephrolithiasis, single kidney,  self-catheterization, recent urogenital procedure.   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  [place].   BREAST SURGERY     CYSTOSCOPY     TUBAL LIGATION      OB History   No obstetric history on file.      Home Medications    Prior to Admission medications   Medication Sig Start Date End Date Taking? Authorizing Provider  aspirin 81 MG chewable tablet Chew by mouth daily.   Yes [provider]  gabapentin (NEURONTIN) 100 MG capsule Take 1 capsule (100 mg total) by mouth at bedtime as needed. 01/07/21  Yes Da Authement K, PA-C  naproxen (NAPROSYN) 375 MG tablet Take 1 tablet (375 mg total) by mouth 2 (two) times daily. 01/07/21  Yes Aldred Mase, Derry Skill, PA-C  tiZANidine (ZANAFLEX) 4 MG tablet Take 1 tablet (4 mg total) by mouth every 8 (eight) hours as needed for muscle spasms. 01/07/21  Yes Graceann Boileau, Derry Skill, PA-C  Black Currant Seed Oil 500 MG CAPS Take by mouth.    [provider]  Dulaglutide (TRULICITY) 1.5 EU/2.3NT SOPN Inject 1.5 mg into the skin once a week. 06/11/20   Nche, Charlene Brooke, NP  EUTHYROX 150 MCG tablet Take 1 tablet (150 mcg total) by mouth daily before breakfast. 06/11/20   Nche, Charlene Brooke, NP  loratadine  (CLARITIN) 10 MG tablet Take 10 mg by mouth daily. Patient not taking: Reported on 01/07/2021 02/21/20   [provider]  metFORMIN (GLUCOPHAGE) 1000 MG tablet TAKE 1 TABLET BY MOUTH TWICE DAILY WITH  A  MEAL 12/03/20   Nche, Charlene Brooke, NP  Multiple Vitamins-Minerals (MULTIVITAMIN ADULT PO) Take by mouth.    [provider]  OVER THE COUNTER MEDICATION     [provider]    Family History Family History  Problem Relation Age of Onset   Transient ischemic attack Mother 27       x 2   Hypertension Mother    Diabetes Mother        diet controlled   Stroke Mother    Glaucoma Maternal Grandmother    Diabetes Maternal Grandmother    Drug abuse Father        iv drug abuse   Breast cancer Maternal Aunt    Throat cancer Maternal Aunt    Lung cancer Paternal Aunt    Colon cancer Neg Hx     Social History Social History   Tobacco Use   Smoking status: Former    Packs/day: 0.30    Types: Cigarettes    Quit date: 07/05/2011    Years since quitting: 9.5   Smokeless tobacco: Never   Tobacco comments:    Counseling sheet given to quit smoking   Vaping Use   Vaping Use: Never used  Substance Use Topics   Alcohol use: No   Drug use: No     Allergies   Patient has no known allergies.   Review of Systems Review of Systems  Constitutional:  Positive for activity change. Negative for appetite change, fatigue and fever.  Respiratory:  Negative for cough and shortness of breath.   Cardiovascular:  Negative for chest pain.  Gastrointestinal:  Negative for abdominal pain, diarrhea, nausea and vomiting.  Genitourinary:  Positive for urgency. Negative for dysuria, frequency, vaginal bleeding, vaginal discharge and vaginal pain.  Musculoskeletal:  Positive for arthralgias, back pain, myalgias and neck pain. Negative for gait problem and joint swelling.  Neurological:  Positive for headaches. Negative for dizziness, speech difficulty, weakness and  light-headedness.    Physical Exam Triage Vital Signs ED Triage Vitals  Enc Vitals Group     BP 01/07/21 1102 129/77     Pulse Rate 01/07/21 1102 89     Resp 01/07/21 1102 20     Temp 01/07/21 1102 98.4 F (36.9 C)     Temp Source 01/07/21 1102 Oral     SpO2 01/07/21 1102 96 %     Weight --      Height --      Head Circumference --      Peak Flow --      Pain Score 01/07/21 1058 10     Pain Loc --      Pain Edu? --      Excl. in [place]? --  No data found.  Updated Vital Signs BP 129/77 (BP Location: Left Arm) Comment (BP Location): large cuff   Pulse 89    Temp 98.4 F (36.9 C) (Oral)    Resp 20    SpO2 96%   Visual Acuity Right Eye Distance:   Left Eye Distance:   Bilateral Distance:    Right Eye Near:   Left Eye Near:    Bilateral Near:     Physical Exam Vitals reviewed.  Constitutional:      General: She is awake. She is not in acute distress.    Appearance: Normal appearance. She is well-developed. She is not ill-appearing.     Comments: Very pleasant female appears stated age no acute distress  HENT:     Head: Normocephalic and atraumatic. No raccoon eyes, Battle's sign or contusion.     Right Ear: Tympanic membrane, ear canal and external ear normal. No hemotympanum.     Left Ear: Tympanic membrane, ear canal and external ear normal. No hemotympanum.     Nose: Nose normal.     Mouth/Throat:     Tongue: Tongue does not deviate from midline.     Pharynx: Uvula midline. No oropharyngeal exudate or posterior oropharyngeal erythema.  Eyes:     Extraocular Movements: Extraocular movements intact.     Pupils: Pupils are equal, round, and reactive to light.  Cardiovascular:     Rate and Rhythm: Normal rate and regular rhythm.     Heart sounds: Normal heart sounds, S1 normal and S2 normal. No murmur heard. Pulmonary:     Effort: Pulmonary effort is normal.     Breath sounds: Normal breath sounds. No wheezing, rhonchi or rales.     Comments: Clear to  auscultation bilaterally Abdominal:     General: Bowel sounds are normal.     Palpations: Abdomen is soft.     Tenderness: There is no abdominal tenderness. There is no right CVA tenderness or left CVA tenderness.  Musculoskeletal:     Cervical back: Normal range of motion and neck supple. Tenderness present. No bony tenderness. Muscular tenderness present. No spinous process tenderness.     Thoracic back: No tenderness or bony tenderness.     Lumbar back: No tenderness or bony tenderness.     Comments: Strength 5/5 bilateral upper and lower extremities  Lymphadenopathy:     Head:     Right side of head: No submental, submandibular or tonsillar adenopathy.     Left side of head: No submental, submandibular or tonsillar adenopathy.  Neurological:     General: No focal deficit present.     Cranial Nerves: Cranial nerves 2-12 are intact.     Motor: Motor function is intact.     Coordination: Coordination is intact.     Gait: Gait is intact.     Comments: No neurological defect on exam.  Psychiatric:        Behavior: Behavior is cooperative.     UC Treatments / Results  Labs (all labs ordered are listed, but only abnormal results are displayed) Labs Reviewed  POCT URINALYSIS DIPSTICK, ED / UC - Abnormal; Notable for the following components:      Result Value   Glucose, UA 250 (*)    Ketones, ur 15 (*)    Protein, ur 30 (*)    All other components within normal limits  URINE CULTURE  POC URINE PREG, ED    EKG   Radiology No results found.  Procedures Procedures (including critical  care time)  Medications Ordered in UC Medications - No data to display  Initial Impression / Assessment and Plan / UC Course  I have reviewed the triage vital signs and the nursing notes.  Pertinent labs & imaging results that were available during my care of the patient were reviewed by me and considered in my medical decision making (see chart for details).     Patient is nontoxic,  afebrile, nontachycardic.  Urine appears that patient had not been eating but no significant evidence of infection.  Discussed that recent antibiotic use could interfere with results so we will send this off for culture.  Will defer additional antibiotics until culture results are obtained.  She was encouraged to rest and drink plenty of fluid.  Discussed if she has any worsening symptoms she needs to be reevaluated immediately.  Strict return precautions given to which she expressed understanding.  Unclear etiology of symptoms.  Discussed that symptoms are most consistent with radiculopathy given clinical presentation.  Neck x-ray was obtained that showed no osseous abnormalities.  Neurological exam was normal with no focal weakness but discussed that symptoms could be related to an underlying neurological issue such as MS and she should follow-up with her PCP for further evaluation and management including potentially advanced imaging.  We will treat radiculopathy with conservative treatment and she was started on Naprosyn twice daily with instruction not to take NSAIDs with this medication.  She was prescribed Zanaflex to be used up to 3 times a day as needed with instruction not to drive or drink alcohol while taking this.  We will start gabapentin 100 mg at night but discussed that if symptoms are improving with this medication we can increase the dose after 3 to 5 days based on her tolerance.  Discussed that if she has any worsening pain, weakness, fever, headache, she needs to be seen immediately.  Strict return precautions given to which she expressed understanding.  Recommended follow-up with PCP or sports medicine as it is possible with given contact information for local sports medicine group.  She was provided work excuse note.  Final Clinical Impressions(s) / UC Diagnoses   Final diagnoses:  Right arm pain  Neck pain  Cervical pain  Radicular neuropathy  Urinary frequency     Discharge  Instructions      Your urine was normal.  We will send this off for culture and contact you if we need to arrange any treatment.  Make sure you are pushing fluids.  If you have any worsening symptoms please return for reevaluation.  I believe that the burning and painful sensation in your arm is related to nerve injury.  This often some from your neck.  Your x-ray did not show any significant abnormalities which is good news they may need an MRI in the future if symptoms do not improve.  I have called in 3 medications to help your symptoms.  Please take gabapentin 100 mg at night.  If this is helping we can consider increasing the dose every few days.  Take Naprosyn twice daily.  Do not take NSAIDs including aspirin, ibuprofen/Advil, naproxen/Aleve with this medication.  Use Zanaflex 3 times a day as needed.  This can make you sleepy so do not drive or drink alcohol with taking it.  Use gentle stretch for additional symptom relief.  As we discussed, I think you will need additional follow-up so please contact sports medicine or your PCP for follow-up soon as possible.  If anything  worsens and the pain gets worse, you develop fever, you have weakness you need to go to the emergency room.     ED Prescriptions     Medication Sig Dispense Auth. Provider   gabapentin (NEURONTIN) 100 MG capsule Take 1 capsule (100 mg total) by mouth at bedtime as needed. 30 capsule Carlee Vonderhaar K, PA-C   tiZANidine (ZANAFLEX) 4 MG tablet Take 1 tablet (4 mg total) by mouth every 8 (eight) hours as needed for muscle spasms. 21 tablet Candon Caras K, PA-C   naproxen (NAPROSYN) 375 MG tablet Take 1 tablet (375 mg total) by mouth 2 (two) times daily. 20 tablet Adams Hinch, Derry Skill, PA-C      PDMP not reviewed this encounter.   Terrilee Croak, PA-C 01/07/21 1328

## 2021-01-07 NOTE — ED Notes (Signed)
Notified erin raspet , pa of patient's complaints

## 2021-01-07 NOTE — Discharge Instructions (Signed)
Your urine was normal.  We will send this off for culture and contact you if we need to arrange any treatment.  Make sure you are pushing fluids.  If you have any worsening symptoms please return for reevaluation.  I believe that the burning and painful sensation in your arm is related to nerve injury.  This often some from your neck.  Your x-ray did not show any significant abnormalities which is good news they may need an MRI in the future if symptoms do not improve.  I have called in 3 medications to help your symptoms.  Please take gabapentin 100 mg at night.  If this is helping we can consider increasing the dose every few days.  Take Naprosyn twice daily.  Do not take NSAIDs including aspirin, ibuprofen/Advil, naproxen/Aleve with this medication.  Use Zanaflex 3 times a day as needed.  This can make you sleepy so do not drive or drink alcohol with taking it.  Use gentle stretch for additional symptom relief.  As we discussed, I think you will need additional follow-up so please contact sports medicine or your PCP for follow-up soon as possible.  If anything worsens and the pain gets worse, you develop fever, you have weakness you need to go to the emergency room.

## 2021-01-08 LAB — URINE CULTURE: Culture: NO GROWTH

## 2021-01-09 ENCOUNTER — Other Ambulatory Visit: Payer: Self-pay

## 2021-01-09 ENCOUNTER — Ambulatory Visit: Payer: BC Managed Care – PPO | Admitting: Nurse Practitioner

## 2021-01-09 ENCOUNTER — Encounter: Payer: Self-pay | Admitting: Nurse Practitioner

## 2021-01-09 VITALS — BP 132/84 | HR 78 | Temp 97.1°F | Ht 68.0 in | Wt 266.0 lb

## 2021-01-09 DIAGNOSIS — R1031 Right lower quadrant pain: Secondary | ICD-10-CM | POA: Diagnosis not present

## 2021-01-09 DIAGNOSIS — E039 Hypothyroidism, unspecified: Secondary | ICD-10-CM

## 2021-01-09 DIAGNOSIS — E1165 Type 2 diabetes mellitus with hyperglycemia: Secondary | ICD-10-CM

## 2021-01-09 LAB — CBC WITH DIFFERENTIAL/PLATELET
Basophils Absolute: 0.1 10*3/uL (ref 0.0–0.1)
Basophils Relative: 0.8 % (ref 0.0–3.0)
Eosinophils Absolute: 0.2 10*3/uL (ref 0.0–0.7)
Eosinophils Relative: 3.1 % (ref 0.0–5.0)
HCT: 40.5 % (ref 36.0–46.0)
Hemoglobin: 13 g/dL (ref 12.0–15.0)
Lymphocytes Relative: 40.4 % (ref 12.0–46.0)
Lymphs Abs: 2.8 10*3/uL (ref 0.7–4.0)
MCHC: 32.1 g/dL (ref 30.0–36.0)
MCV: 85.8 fl (ref 78.0–100.0)
Monocytes Absolute: 0.3 10*3/uL (ref 0.1–1.0)
Monocytes Relative: 4 % (ref 3.0–12.0)
Neutro Abs: 3.5 10*3/uL (ref 1.4–7.7)
Neutrophils Relative %: 51.7 % (ref 43.0–77.0)
Platelets: 373 10*3/uL (ref 150.0–400.0)
RBC: 4.72 Mil/uL (ref 3.87–5.11)
RDW: 14.2 % (ref 11.5–15.5)
WBC: 6.8 10*3/uL (ref 4.0–10.5)

## 2021-01-09 LAB — LIPASE: Lipase: 65 U/L — ABNORMAL HIGH (ref 11.0–59.0)

## 2021-01-09 LAB — BASIC METABOLIC PANEL
BUN: 6 mg/dL (ref 6–23)
CO2: 28 mEq/L (ref 19–32)
Calcium: 9.4 mg/dL (ref 8.4–10.5)
Chloride: 101 mEq/L (ref 96–112)
Creatinine, Ser: 0.67 mg/dL (ref 0.40–1.20)
GFR: 104.76 mL/min (ref >60.00)
Glucose, Bld: 161 mg/dL — ABNORMAL HIGH (ref 70–99)
Potassium: 3.9 mEq/L (ref 3.5–5.1)
Sodium: 137 mEq/L (ref 135–145)

## 2021-01-09 LAB — HEPATIC FUNCTION PANEL
ALT: 24 U/L (ref 0–35)
AST: 19 U/L (ref 0–37)
Albumin: 4.5 g/dL (ref 3.5–5.2)
Alkaline Phosphatase: 89 U/L (ref 39–117)
Bilirubin, Direct: 0.1 mg/dL (ref 0.0–0.3)
Total Bilirubin: 0.5 mg/dL (ref 0.2–1.2)
Total Protein: 7.3 g/dL (ref 6.0–8.3)

## 2021-01-09 LAB — HEMOGLOBIN A1C: Hgb A1c MFr Bld: 9.9 % — ABNORMAL HIGH (ref 4.6–6.5)

## 2021-01-09 MED ORDER — GLIPIZIDE 5 MG PO TABS: 5.0000 mg | ORAL_TABLET | Freq: Two times a day (BID) | ORAL | 5 refills | Status: DC

## 2021-01-09 MED ORDER — TRAMADOL-ACETAMINOPHEN 37.5-325 MG PO TABS: 1.0000 | ORAL_TABLET | Freq: Four times a day (QID) | ORAL | 0 refills | Status: AC | PRN

## 2021-01-09 NOTE — Assessment & Plan Note (Signed)
Uncontrolled Non fasting glucose of 250 Current use of trulicity and metformin Reports urinary frquency Positive glucose in urine  Repeat hgbA1c today

## 2021-01-09 NOTE — Patient Instructions (Signed)
Hold gabapentin and tinizadine Use naproxen and ultracet for pain.  Go to lab for blood draw You will be contacted to schedule appt for CT ABD/pelvis.

## 2021-01-09 NOTE — Progress Notes (Signed)
Subjective:  Patient ID: Kim Delgado, female    DOB: 12/13/1975  Age: 46 y.o. MRN: [id number]  CC: Acute Visit (Pt c/o right sided back, arm, and neck pain x 5 day. Pt states last week she was experiencing low back pain along with urinary frequency and thought she had a UTI but after the pain started to increase and eventually caused numbness in her right arm and hand she went to urgent care. Xrays were done at urgent care and medication given but patient is still experiencing the pain and numbness, all on the right side of her body. )  Flank Pain This is a new problem. The current episode started in the past 7 days. The problem occurs constantly. The problem is unchanged. The pain is present in the lumbar spine. The quality of the pain is described as aching and cramping. The pain radiates to the right thigh. The pain is at a severity of 10/10. The pain is severe. The pain is The same all the time. Associated symptoms include numbness and tingling. Pertinent negatives include no abdominal pain, bladder incontinence, bowel incontinence, chest pain, dysuria, fever, headaches, leg pain, paresis, paresthesias, pelvic pain, perianal numbness, weakness or weight loss. Risk factors include obesity and sedentary lifestyle. She has tried NSAIDs, muscle relaxant and bed rest for the symptoms. The treatment provided mild relief.   Diabetes ([place]) Uncontrolled Non fasting glucose of 250 Current use of trulicity and metformin Reports urinary frquency Positive glucose in urine  Repeat hgbA1c today  Acquired hypothyroidism Repeat TSH and T4  Reviewed past Medical, Social and Family history today.  Outpatient Medications Prior to Visit  Medication Sig Dispense Refill   aspirin 81 MG chewable tablet Chew by mouth daily.     Black Currant Seed Oil 500 MG CAPS Take by mouth.     Dulaglutide (TRULICITY) 1.5 XI/5.0TU SOPN Inject 1.5 mg into the skin once a week. 2 mL 5   EUTHYROX 150 MCG tablet Take 1 tablet  (150 mcg total) by mouth daily before breakfast. 90 tablet 1   gabapentin (NEURONTIN) 100 MG capsule Take 1 capsule (100 mg total) by mouth at bedtime as needed. 30 capsule 0   metFORMIN (GLUCOPHAGE) 1000 MG tablet TAKE 1 TABLET BY MOUTH TWICE DAILY WITH  A  MEAL 180 tablet 0   Multiple Vitamins-Minerals (MULTIVITAMIN ADULT PO) Take by mouth.     naproxen (NAPROSYN) 375 MG tablet Take 1 tablet (375 mg total) by mouth 2 (two) times daily. 20 tablet 0   OVER THE COUNTER MEDICATION      tiZANidine (ZANAFLEX) 4 MG tablet Take 1 tablet (4 mg total) by mouth every 8 (eight) hours as needed for muscle spasms. 21 tablet 0   loratadine (CLARITIN) 10 MG tablet Take 10 mg by mouth daily. (Patient not taking: Reported on 01/07/2021)     No facility-administered medications prior to visit.    ROS See HPI  Objective:  BP 132/84 (BP Location: Left Arm, Patient Position: Sitting, Cuff Size: Large)    Pulse 78    Temp (!) 97.1 F (36.2 C) (Temporal)    Ht 5\' 8"  (1.727 m)    Wt 266 lb (120.7 kg)    SpO2 98%    BMI 40.45 kg/m   Physical Exam Constitutional:      General: She is not in acute distress.    Appearance: She is not toxic-appearing or diaphoretic.  Cardiovascular:     Rate and Rhythm: Normal rate and regular  rhythm.     Pulses: Normal pulses.  Pulmonary:     Effort: Pulmonary effort is normal.  Abdominal:     General: There is no distension.     Palpations: Abdomen is soft.     Tenderness: There is right CVA tenderness. There is no left CVA tenderness or guarding.  Musculoskeletal:     Right lower leg: No edema.     Left lower leg: No edema.  Skin:    Findings: No erythema or rash.  Neurological:     Mental Status: She is alert and oriented to person, place, and time.  Psychiatric:        Mood and Affect: Mood normal.        Behavior: Behavior normal.    Assessment & Plan:  This visit occurred during the SARS-CoV-2 public health emergency.  Safety protocols were in place, including  screening questions prior to the visit, additional usage of staff PPE, and extensive cleaning of exam room while observing appropriate contact time as indicated for disinfecting solutions.   [name] was seen today for acute visit.  Diagnoses and all orders for this visit:  Right lower quadrant abdominal pain -     Basic metabolic panel -     CBC with Differential/Platelet -     Lipase -     Hepatic function panel -     CT ABDOMEN PELVIS WO CONTRAST; Future -     traMADol-acetaminophen (ULTRACET) 37.5-325 MG tablet; Take 1 tablet by mouth every 6 (six) hours as needed for up to 3 days for severe pain.  Acquired hypothyroidism -     TSH -     T4, free  Type 2 diabetes mellitus with hyperglycemia, without long-term current use of insulin (HCC) -     Hemoglobin A1c   Problem List Items Addressed This Visit       Endocrine   Acquired hypothyroidism    Repeat TSH and T4      Relevant Orders   TSH   T4, free   Diabetes ([place])    Uncontrolled Non fasting glucose of 250 Current use of trulicity and metformin Reports urinary frquency Positive glucose in urine  Repeat hgbA1c today      Relevant Orders   Hemoglobin A1c   Other Visit Diagnoses     Right lower quadrant abdominal pain    -  Primary   Relevant Medications   traMADol-acetaminophen (ULTRACET) 37.5-325 MG tablet   Other Relevant Orders   Basic metabolic panel   CBC with Differential/Platelet   Lipase   Hepatic function panel   CT ABDOMEN PELVIS WO CONTRAST       Follow-up: No follow-ups on file.  Wilfred Lacy, NP

## 2021-01-09 NOTE — Assessment & Plan Note (Signed)
Repeat TSH and T4 °

## 2021-01-10 ENCOUNTER — Ambulatory Visit (HOSPITAL_BASED_OUTPATIENT_CLINIC_OR_DEPARTMENT_OTHER)
Admission: RE | Admit: 2021-01-10 | Discharge: 2021-01-10 | Disposition: A | Discharge status: Home / Self Care | Payer: BC Managed Care – PPO | Source: Ambulatory Visit | Attending: Nurse Practitioner | Admitting: Nurse Practitioner

## 2021-01-10 DIAGNOSIS — K760 Fatty (change of) liver, not elsewhere classified: Secondary | ICD-10-CM | POA: Diagnosis not present

## 2021-01-10 DIAGNOSIS — R160 Hepatomegaly, not elsewhere classified: Secondary | ICD-10-CM | POA: Diagnosis not present

## 2021-01-10 DIAGNOSIS — K8020 Calculus of gallbladder without cholecystitis without obstruction: Secondary | ICD-10-CM | POA: Diagnosis not present

## 2021-01-10 DIAGNOSIS — R1031 Right lower quadrant pain: Secondary | ICD-10-CM | POA: Diagnosis not present

## 2021-01-10 DIAGNOSIS — M545 Low back pain, unspecified: Secondary | ICD-10-CM | POA: Diagnosis not present

## 2021-01-11 ENCOUNTER — Encounter: Payer: Self-pay | Admitting: Nurse Practitioner

## 2021-01-24 ENCOUNTER — Other Ambulatory Visit: Payer: Self-pay | Admitting: Nurse Practitioner

## 2021-01-24 DIAGNOSIS — E039 Hypothyroidism, unspecified: Secondary | ICD-10-CM

## 2021-01-28 NOTE — Telephone Encounter (Signed)
Chart supports Rx  Last seen 01/09/21 No future appointments scheduled.

## 2021-01-29 ENCOUNTER — Encounter: Payer: Self-pay | Admitting: Internal Medicine

## 2021-02-16 ENCOUNTER — Ambulatory Visit (AMBULATORY_SURGERY_CENTER): Payer: BC Managed Care – PPO | Admitting: *Deleted

## 2021-02-16 ENCOUNTER — Other Ambulatory Visit: Payer: Self-pay

## 2021-02-16 VITALS — Ht 68.0 in | Wt 263.0 lb

## 2021-02-16 DIAGNOSIS — Z1211 Encounter for screening for malignant neoplasm of colon: Secondary | ICD-10-CM

## 2021-02-16 MED ORDER — NA SULFATE-K SULFATE-MG SULF 17.5-3.13-1.6 GM/177ML PO SOLN: 1.0000 | Freq: Once | ORAL | 0 refills | Status: AC

## 2021-02-16 NOTE — Progress Notes (Signed)
No egg or soy allergy known to patient  No issues known to pt with past sedation with any surgeries or procedures Patient denies ever being told they had issues or difficulty with intubation  No FH of Malignant Hyperthermia Pt is not on diet pills Pt is not on  home 02  Pt is not on blood thinners  Pt denies issues with constipation  No A fib or A flutter  rectal  bleeding that is increasing in amounts and frequency - she has 3 stools a day and has blood with each BM- stools are not hard but takes at least an hour for stools to come down to pass and will have to drink coffee to have a BM - has had rectal bleeding since before 2012 colon with JMP   Pt is fully vaccinated  for Covid    NO PA's for preps discussed with pt In PV today  Discussed with pt there will be an out-of-pocket cost for prep and that varies from $0 to 70 +  dollars - pt verbalized understanding   Due to the COVID-19 pandemic we are asking patients to follow certain guidelines in PV and the [place]   Pt aware of COVID protocols and [facility] guidelines   PV completed over the phone. Pt verified name, DOB, address and insurance during PV today.   Pt encouraged to call with questions or issues.  If pt has My chart, procedure instructions sent via My Chart  Emailed instructions to [email or web address]

## 2021-02-23 ENCOUNTER — Other Ambulatory Visit: Payer: Self-pay | Admitting: Nurse Practitioner

## 2021-02-23 DIAGNOSIS — E1169 Type 2 diabetes mellitus with other specified complication: Secondary | ICD-10-CM

## 2021-02-26 ENCOUNTER — Encounter: Payer: Self-pay | Admitting: Internal Medicine

## 2021-03-02 ENCOUNTER — Ambulatory Visit (AMBULATORY_SURGERY_CENTER): Payer: BC Managed Care – PPO | Admitting: Internal Medicine

## 2021-03-02 ENCOUNTER — Encounter: Payer: Self-pay | Admitting: Internal Medicine

## 2021-03-02 ENCOUNTER — Other Ambulatory Visit: Payer: Self-pay

## 2021-03-02 VITALS — BP 140/86 | HR 72 | Temp 98.7°F | Resp 14 | Ht 68.0 in | Wt 263.0 lb

## 2021-03-02 DIAGNOSIS — Z1211 Encounter for screening for malignant neoplasm of colon: Secondary | ICD-10-CM

## 2021-03-02 DIAGNOSIS — K635 Polyp of colon: Secondary | ICD-10-CM

## 2021-03-02 DIAGNOSIS — D123 Benign neoplasm of transverse colon: Secondary | ICD-10-CM

## 2021-03-02 DIAGNOSIS — D124 Benign neoplasm of descending colon: Secondary | ICD-10-CM

## 2021-03-02 MED ORDER — SODIUM CHLORIDE 0.9 % IV SOLN: 500.0000 mL | Freq: Once | INTRAVENOUS | Status: DC

## 2021-03-02 NOTE — Progress Notes (Signed)
Report to PACU, RN, vss, BBS= Clear.  

## 2021-03-02 NOTE — Op Note (Signed)
[place] Patient Name: Kim Delgado Procedure Date: 03/02/2021 8:48 AM MRN: [id number] Endoscopist: Jerene Bears , MD Age: 46 Referring MD:  Date of Birth: September 11, 1975 Gender: Female Account #: [id number] Procedure:                Colonoscopy Indications:              Screening for colorectal malignant neoplasm Medicines:                Monitored Anesthesia Care Procedure:                Pre-Anesthesia Assessment:                           - Prior to the procedure, a History and Physical                            was performed, and patient medications and                            allergies were reviewed. The patient's tolerance of                            previous anesthesia was also reviewed. The risks                            and benefits of the procedure and the sedation                            options and risks were discussed with the patient.                            All questions were answered, and informed consent                            was obtained. Prior Anticoagulants: The patient has                            taken no previous anticoagulant or antiplatelet                            agents. ASA Grade Assessment: III - A patient with                            severe systemic disease. After reviewing the risks                            and benefits, the patient was deemed in                            satisfactory condition to undergo the procedure.                           After obtaining informed consent, the colonoscope  was passed under direct vision. Throughout the                            procedure, the patient's blood pressure, pulse, and                            oxygen saturations were monitored continuously. The                            PCF-HQ190L Colonoscope was introduced through the                            anus and advanced to the cecum, identified by                            appendiceal orifice and  ileocecal valve. The                            colonoscopy was performed without difficulty. The                            patient tolerated the procedure well. The quality                            of the bowel preparation was good. The ileocecal                            valve, appendiceal orifice, and rectum were                            photographed. Scope In: 9:06:21 AM Scope Out: 9:25:42 AM Scope Withdrawal Time: 0 hours 14 minutes 22 seconds  Total Procedure Duration: 0 hours 19 minutes 21 seconds  Findings:                 The digital rectal exam was normal.                           A 8 mm polyp was found in the proximal transverse                            colon. The polyp was sessile. The polyp was removed                            with a cold snare. Resection and retrieval were                            complete.                           A 5 mm polyp was found in the descending colon. The                            polyp was sessile. The polyp was removed with a  cold snare. Resection and retrieval were complete.                           Multiple small-mouthed diverticula were found in                            the sigmoid colon and descending colon.                           Internal hemorrhoids were found during                            retroflexion. The hemorrhoids were small. Complications:            No immediate complications. Estimated Blood Loss:     Estimated blood loss was minimal. Impression:               - One 8 mm polyp in the proximal transverse colon,                            removed with a cold snare. Resected and retrieved.                           - One 5 mm polyp in the descending colon, removed                            with a cold snare. Resected and retrieved.                           - Diverticulosis in the sigmoid colon and in the                            descending colon.                           -  Internal hemorrhoids. Recommendation:           - Patient has a contact number available for                            emergencies. The signs and symptoms of potential                            delayed complications were discussed with the                            patient. Return to normal activities tomorrow.                            Written discharge instructions were provided to the                            patient.                           - Resume previous diet.                           -  Continue present medications.                           - Await pathology results.                           - Hemorrhoidal banding can be considered for                            symptomatic hemorrhoidal bleeding.                           - Repeat colonoscopy is recommended. The                            colonoscopy date will be determined after pathology                            results from today's exam become available for                            review. Jerene Bears, MD 03/02/2021 9:29:04 AM This report has been signed electronically.

## 2021-03-02 NOTE — Progress Notes (Signed)
GASTROENTEROLOGY PROCEDURE H&P NOTE   Primary Care Physician: Flossie Buffy, NP    Reason for Procedure:  Colon cancer screening  Plan:    Colonoscopy  Patient is appropriate for endoscopic procedure(s) in the ambulatory ([place]) setting.  The nature of the procedure, as well as the risks, benefits, and alternatives were carefully and thoroughly reviewed with the patient. Ample time for discussion and questions allowed. The patient understood, was satisfied, and agreed to proceed.     HPI: Kim Delgado is a 46 y.o. female who presents for colonoscopy.  Medical history as below.  Tolerated the prep.  No recent chest pain or shortness of breath.  No abdominal pain today.  Past Medical History:  Diagnosis Date   Allergy    Anemia    Bronchitis with bronchospasm    Diabetes mellitus without complication ([place])    Internal hemorrhoids    rectal  bleeding that is increasing in amounts and frequency - she has 3 stools a day and has blood with each BM   Obesity    Papilloma of breast    removed   Rectal bleeding    Thyroid disease     Past Surgical History:  Procedure Laterality Date   ABDOMINAL HYSTERECTOMY  11/10/2009   BSO; CERVIX INTACT; DUB; fibroids; endometriosis.  [place].   BREAST SURGERY  08/14/2010   remove papilloma   COLONOSCOPY  2012   CYSTOSCOPY     TUBAL LIGATION      Prior to Admission medications   Medication Sig Start Date End Date Taking? Authorizing Provider  Black Currant Seed Oil 500 MG CAPS Take by mouth.   Yes [provider]  EUTHYROX 150 MCG tablet TAKE 1 TABLET BY MOUTH ONCE DAILY BEFORE  BREAKFAST 01/28/21  Yes Nche, Charlene Brooke, NP  glipiZIDE (GLUCOTROL) 5 MG tablet Take 1 tablet (5 mg total) by mouth 2 (two) times daily with breakfast and lunch. 01/09/21  Yes Nche, Charlene Brooke, NP  metFORMIN (GLUCOPHAGE) 1000 MG tablet TAKE 1 TABLET BY MOUTH TWICE DAILY WITH A MEAL ** APPOINTMENT NEEDED FOR ADDITIONAL REFILLS** 02/23/21  Yes Nche,  Charlene Brooke, NP  OVER THE COUNTER MEDICATION    Yes [provider]  Multiple Vitamins-Minerals (MULTIVITAMIN ADULT PO) Take by mouth.    [provider]  naproxen (NAPROSYN) 375 MG tablet Take 1 tablet (375 mg total) by mouth 2 (two) times daily. Patient not taking: Reported on 02/16/2021 01/07/21   Raspet, Junie Panning K, PA-C  tiZANidine (ZANAFLEX) 4 MG tablet Take 1 tablet (4 mg total) by mouth every 8 (eight) hours as needed for muscle spasms. 01/07/21   Raspet, Derry Skill, PA-C    Current Outpatient Medications  Medication Sig Dispense Refill   Black Currant Seed Oil 500 MG CAPS Take by mouth.     EUTHYROX 150 MCG tablet TAKE 1 TABLET BY MOUTH ONCE DAILY BEFORE  BREAKFAST 90 tablet 0   glipiZIDE (GLUCOTROL) 5 MG tablet Take 1 tablet (5 mg total) by mouth 2 (two) times daily with breakfast and lunch. 60 tablet 5   metFORMIN (GLUCOPHAGE) 1000 MG tablet TAKE 1 TABLET BY MOUTH TWICE DAILY WITH A MEAL ** APPOINTMENT NEEDED FOR ADDITIONAL REFILLS** 180 tablet 0   OVER THE COUNTER MEDICATION      Multiple Vitamins-Minerals (MULTIVITAMIN ADULT PO) Take by mouth.     naproxen (NAPROSYN) 375 MG tablet Take 1 tablet (375 mg total) by mouth 2 (two) times daily. (Patient not taking: Reported on 02/16/2021)  20 tablet 0   tiZANidine (ZANAFLEX) 4 MG tablet Take 1 tablet (4 mg total) by mouth every 8 (eight) hours as needed for muscle spasms. 21 tablet 0   Current Facility-Administered Medications  Medication Dose Route Frequency Provider Last Rate Last Admin   0.9 %  sodium chloride infusion  500 mL Intravenous Once Azaan Leask, Lajuan Lines, MD        Allergies as of 03/02/2021   (No Known Allergies)    Family History  Problem Relation Age of Onset   Transient ischemic attack Mother 56       x 2   Hypertension Mother    Diabetes Mother        diet controlled   Stroke Mother    Drug abuse Father        iv drug abuse   Breast cancer Maternal Aunt    Throat cancer Maternal Aunt    Lung cancer  Paternal Aunt    Glaucoma Maternal Grandmother    Diabetes Maternal Grandmother    Colon cancer Neg Hx    Colon polyps Neg Hx    Esophageal cancer Neg Hx    Rectal cancer Neg Hx    Stomach cancer Neg Hx     Social History   Socioeconomic History   Marital status: Married    Spouse name: Miguel   Number of children: 4   Years of education: College   Highest education level: Not on file  Occupational History   Occupation: LPN    Employer: [employer]    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  Met 25 years ago.  No abuse.       Children: 4 children (21, 13, 11, 9); no grandchildren.      Lives: with husband 4 children.      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       Education: [employer].  Only has two year RN: wanting to go back for BSN.       Tobacco; quit 07/2011.  Smoked x 20 years.       Alcohol: wine weekly 1 per week; rarely.       Exercise: none in 03/2013; some exercise 2 times per week; walking.       Seatbelt: 100%      Guns: none      Patient is right-handed.   Patient drinks one cup of coffee every morning.      Social Determinants of Health   Financial Resource Strain: Not on file  Food Insecurity: Not on file  Transportation Needs: Not on file  Physical Activity: Not on file  Stress: Not on file  Social Connections: Not on file  Intimate Partner Violence: Not on file    Physical Exam: Vital signs in  last 24 hours: @BP  138/80  Pulse 75    Temp 98.7 F (37.1 C) (Temporal)    Resp 11    Ht 5' 8"  (1.727 m)    Wt 263 lb (119.3 kg)    SpO2 100%    BMI 39.99 kg/m  GEN: NAD EYE: Sclerae anicteric ENT: MMM CV: Non-tachycardic Pulm: CTA b/l GI: Soft, NT/ND NEURO:  Alert & Oriented x 3   Zenovia Jarred, MD [facility]  03/02/2021 8:55 AM

## 2021-03-02 NOTE — Progress Notes (Signed)
Vitals-CW  Pt's states no medical or surgical changes since previsit or office visit. 

## 2021-03-02 NOTE — Patient Instructions (Signed)
Handouts given for polyps, diverticulosis, high fiber diet, hemorrhoids and hemorrhoid banding.  YOU HAD AN ENDOSCOPIC PROCEDURE TODAY AT [facility]:   Refer to the procedure report that was given to you for any specific questions about what was found during the examination.  If the procedure report does not answer your questions, please call your gastroenterologist to clarify.  If you requested that your care partner not be given the details of your procedure findings, then the procedure report has been included in a sealed envelope for you to review at your convenience later.  YOU SHOULD EXPECT: Some feelings of bloating in the abdomen. Passage of more gas than usual.  Walking can help get rid of the air that was put into your GI tract during the procedure and reduce the bloating. If you had a lower endoscopy (such as a colonoscopy or flexible sigmoidoscopy) you may notice spotting of blood in your stool or on the toilet paper. If you underwent a bowel prep for your procedure, you may not have a normal bowel movement for a few days.  Please Note:  You might notice some irritation and congestion in your nose or some drainage.  This is from the oxygen used during your procedure.  There is no need for concern and it should clear up in a day or so.  SYMPTOMS TO REPORT IMMEDIATELY:  Following lower endoscopy (colonoscopy or flexible sigmoidoscopy):  Excessive amounts of blood in the stool  Significant tenderness or worsening of abdominal pains  Swelling of the abdomen that is new, acute  Fever of 100F or higher  For urgent or emergent issues, a gastroenterologist can be reached at any hour by calling [phone]. Do not use MyChart messaging for urgent concerns.    DIET:  We do recommend a small meal at first, but then you may proceed to your regular diet.  Drink plenty of fluids but you should avoid alcoholic beverages for 24 hours.  ACTIVITY:  You should plan to take it  easy for the rest of today and you should NOT DRIVE or use heavy machinery until tomorrow (because of the sedation medicines used during the test).    FOLLOW UP: Our staff will call the number listed on your records 48-72 hours following your procedure to check on you and address any questions or concerns that you may have regarding the information given to you following your procedure. If we do not reach you, we will leave a message.  We will attempt to reach you two times.  During this call, we will ask if you have developed any symptoms of COVID 19. If you develop any symptoms (ie: fever, flu-like symptoms, shortness of breath, cough etc.) before then, please call [phone].  If you test positive for Covid 19 in the 2 weeks post procedure, please call and report this information to [place].    If any biopsies were taken you will be contacted by phone or by letter within the next 1-3 weeks.  Please call us at [phone] if you have not heard about the biopsies in 3 weeks.    SIGNATURES/CONFIDENTIALITY: You and/or your care partner have signed paperwork which will be entered into your electronic medical record.  These signatures attest to the fact that that the information above on your After Visit Summary has been reviewed and is understood.  Full responsibility of the confidentiality of this discharge information lies with you and/or your care-partner.

## 2021-03-02 NOTE — Progress Notes (Signed)
Called to room to assist during endoscopic procedure.  Patient ID and intended procedure confirmed with present staff. Received instructions for my participation in the procedure from the performing physician.  

## 2021-03-04 ENCOUNTER — Telehealth: Payer: Self-pay

## 2021-03-04 NOTE — Telephone Encounter (Signed)
?  Follow up Call- ? ?Call back number 03/02/2021  ?Post procedure Call Back phone  # [phone]  ?Permission to leave phone message Yes  ?Some recent data might be hidden  ?  ? ?Patient questions: ? ?Do you have a fever, pain , or abdominal swelling? No. ?Pain Score  0 * ? ?Have you tolerated food without any problems? Yes.   ? ?Have you been able to return to your normal activities? Yes.   ? ?Do you have any questions about your discharge instructions: ?Diet   No. ?Medications  No. ?Follow up visit  No. ? ?Do you have questions or concerns about your Care? No. ? ?Actions: ?* If pain score is 4 or above: ?No action needed, pain <4. ? ?Have you developed a fever since your procedure? no ? ?2.   Have you had an respiratory symptoms (SOB or cough) since your procedure? no ? ?3.   Have you tested positive for COVID 19 since your procedure no ? ?4.   Have you had any family members/close contacts diagnosed with the COVID 19 since your procedure?  no ? ? ?If yes to any of these questions please route to Joylene John, RN and Joella Prince, RN ? ? ? ?

## 2021-03-05 ENCOUNTER — Encounter: Payer: Self-pay | Admitting: Internal Medicine

## 2021-03-13 ENCOUNTER — Encounter: Payer: Self-pay | Admitting: *Deleted

## 2021-03-24 ENCOUNTER — Ambulatory Visit: Payer: BC Managed Care – PPO | Admitting: Internal Medicine

## 2021-03-24 ENCOUNTER — Encounter: Payer: Self-pay | Admitting: Internal Medicine

## 2021-03-24 VITALS — BP 118/72 | HR 93 | Ht 68.0 in | Wt 263.0 lb

## 2021-03-24 DIAGNOSIS — K648 Other hemorrhoids: Secondary | ICD-10-CM | POA: Diagnosis not present

## 2021-03-24 NOTE — Patient Instructions (Signed)
Please purchase the following medications over the counter and take as directed: ?Metamucil 2 teaspoons twice daily ? ?You have been scheduled for your 2nd hemorrhoidal banding on 05/06/21 at 4 :00 pm.  ? ?You have been scheduled for your 3rd hemorrhoidal banding on 05/25/21 at 3:40 pm. ? ?HEMORRHOID BANDING PROCEDURE  ? ? FOLLOW-UP CARE ? ? ?The procedure you have had should have been relatively painless since the banding of the area involved does not have nerve endings and there is no pain sensation.  The rubber band cuts off the blood supply to the hemorrhoid and the band may fall off as soon as 48 hours after the banding (the band may occasionally be seen in the toilet bowl following a bowel movement). You may notice a temporary feeling of fullness in the rectum which should respond adequately to plain Tylenol? or Motrin?. ? ?Following the banding, avoid strenuous exercise that evening and resume full activity the next day.  A sitz bath (soaking in a warm tub) or bidet is soothing, and can be useful for cleansing the area after bowel movements.   ? ? ?To avoid constipation, take two tablespoons of natural wheat bran, natural oat bran, flax, Benefiber? or any over the counter fiber supplement and increase your water intake to 7-8 glasses daily.   ? ?Unless you have been prescribed anorectal medication, do not put anything inside your rectum for two weeks: No suppositories, enemas, fingers, etc. ? ?Occasionally, you may have more bleeding than usual after the banding procedure.  This is often from the untreated hemorrhoids rather than the treated one.  Don?t be concerned if there is a tablespoon or so of blood.  If there is more blood than this, lie flat with your bottom higher than your head and apply an ice pack to the area. If the bleeding does not stop within a half an hour or if you feel faint, call our office at [phone] or go to the emergency room. ? ?Problems are not common; however, if there is a  substantial amount of bleeding, severe pain, chills, fever or difficulty passing urine (very rare) or other problems, you should call us at [phone] or report to the nearest emergency room. ? ?Do not stay seated continuously for more than 2-3 hours for a day or two after the procedure.  Tighten your buttock muscles 10-15 times every two hours and take 10-15 deep breaths every 1-2 hours.  Do not spend more than a few minutes on the toilet if you cannot empty your bowel; instead re-visit the toilet at a later time. ? ? ?If you are age 47 or older, your body mass index should be between 23-30. Your Body mass index is 39.99 kg/m?Marland Kitchen If this is out of the aforementioned range listed, please consider follow up with your Primary Care Provider. ? ?If you are age 47 or younger, your body mass index should be between 19-25. Your Body mass index is 39.99 kg/m?Marland Kitchen If this is out of the aformentioned range listed, please consider follow up with your Primary Care Provider.  ? ?________________________________________________________ ? ?The  GI providers would like to encourage you to use [facility] to communicate with providers for non-urgent requests or questions.  Due to long hold times on the telephone, sending your provider a message by [facility] may be a faster and more efficient way to get a response.  Please allow 48 business hours for a response.  Please remember that this is for non-urgent requests.  ?  _______________________________________________________ ?Due to recent changes in healthcare laws, you may see the results of your imaging and laboratory studies on MyChart before your provider has had a chance to review them.  We understand that in some cases there may be results that are confusing or concerning to you. Not all laboratory results come back in the same time frame and the provider may be waiting for multiple results in order to interpret others.  Please give [place] 48 hours in order for your provider to  thoroughly review all the results before contacting the office for clarification of your results.  ? ?

## 2021-03-25 NOTE — Progress Notes (Signed)
Kim Delgado is a 46 year old female seen today for symptomatic internal hemorrhoids. ?Known to me from her screening colonoscopy performed in February of this year.  She had an 8 mm SSP removed.  Internal hemorrhoids were seen on retroflexion. ? ?Symptoms related to hemorrhoids: Regular bleeding with every bowel movement, mostly painless in nature.  Intermittent rectal itching and burning. ? ?No prior hemorrhoidal therapy. ? ?She does note that she has regular having 2-3 bowel movements a day but typically sits for 20 to 30 minutes on the toilet before able to evacuate stool. ? ? ?PROCEDURE NOTE: ? ?The patient presents with symptomatic grade 2 internal hemorrhoids, requesting rubber band ligation of her hemorrhoidal disease.  All risks, benefits and alternative forms of therapy were described and informed consent was obtained. ? ? ?The anorectum was pre-medicated with 0.125% nitroglycerin ointment ?The decision was made to band the 2 internal hemorrhoid, and the Herricks O?Regan System was used to perform band ligation without complication.  ? ?Digital anorectal examination was then performed to assure proper positioning of the band, and to adjust the banded tissue as required. ? ?The patient was discharged home without pain or other issues.  Dietary and behavioral recommendations were given and along with follow-up instructions.   ?  ?The following adjunctive treatments were recommended: ?Add Metamucil 2 teaspoons one to BID to help with bowel movement frequency and timeliness; hemorrhoidal symptoms should improve if there is less time required to produce bowel movement ? ?The patient will return as scheduled for follow-up and possible additional banding as required. ?No complications were encountered and the patient tolerated the procedure well. ? ?

## 2021-03-26 ENCOUNTER — Ambulatory Visit: Payer: BC Managed Care – PPO | Admitting: Nurse Practitioner

## 2021-03-26 ENCOUNTER — Encounter: Payer: Self-pay | Admitting: Nurse Practitioner

## 2021-03-26 VITALS — BP 124/86 | HR 86 | Temp 96.8°F | Ht 68.0 in | Wt 264.0 lb

## 2021-03-26 DIAGNOSIS — R748 Abnormal levels of other serum enzymes: Secondary | ICD-10-CM | POA: Diagnosis not present

## 2021-03-26 DIAGNOSIS — R109 Unspecified abdominal pain: Secondary | ICD-10-CM

## 2021-03-26 DIAGNOSIS — E1165 Type 2 diabetes mellitus with hyperglycemia: Secondary | ICD-10-CM | POA: Diagnosis not present

## 2021-03-26 DIAGNOSIS — E039 Hypothyroidism, unspecified: Secondary | ICD-10-CM | POA: Diagnosis not present

## 2021-03-26 DIAGNOSIS — G8929 Other chronic pain: Secondary | ICD-10-CM | POA: Insufficient documentation

## 2021-03-26 MED ORDER — TIZANIDINE HCL 4 MG PO TABS: 4.0000 mg | ORAL_TABLET | Freq: Three times a day (TID) | ORAL | 0 refills | Status: DC | PRN

## 2021-03-26 MED ORDER — NAPROXEN 500 MG PO TABS: 500.0000 mg | ORAL_TABLET | Freq: Two times a day (BID) | ORAL | 5 refills | Status: DC | PRN

## 2021-03-26 NOTE — Assessment & Plan Note (Signed)
Repeat TSH and T4 °

## 2021-03-26 NOTE — Assessment & Plan Note (Addendum)
Onset 75month ago, intermittent, improves with use of zanaflex and ultracet. ?Did not take gabapentin or NSAIDs. ?No rash or change in skin coloration. ?CT ABD/pelvis done 01/2021: No acute findings within the abdomen or pelvis. 2 small dependent gallstones. No gallbladder wall thickening or pericholecystic fluid or inflammation. No bile duct dilation. Mild hepatomegaly and diffuse hepatic steatosis.No adrenal masses. Kidneys normal in size,orientation and position. No renal mass, stone or hydronephrosis. Normal ureters. Normal bladder. No CT evidence of significant disc bulging or of a disc herniation. No evidence of lumbar nerve root impingement. ?Colonoscopy done 02/2021: polyps removed, repeat in 566yr? ?Advised to use naproxen and zanaflex as needed, start back stretching exercise, and maintain proper posture when sitting. ?Refer for outpt PT if no improvement in 86m48month

## 2021-03-26 NOTE — Assessment & Plan Note (Signed)
Fasting glucose 888L ?trulicity d/c due to gallstones and elevated lipase, resolved RUQ pain ?Current use of metformin and glipizide ?No adverse side effects. ?Last hgbA1c of 9.9 ? ?Repeat hgbA1c and lipase ?Maintain current med dose ?

## 2021-03-26 NOTE — Patient Instructions (Addendum)
Go to lab for blood draw. ?Bring glucose reading to next appt. ?Use naproxen and zanaflex prn. ?Start back exercise ?alternate between warm and cold compress as needed ? ?Sign medical release to get eye exam report. ? ?Thoracic Strain Rehab ?Ask your health care provider which exercises are safe for you. Do exercises exactly as told by your health care provider and adjust them as directed. It is normal to feel mild stretching, pulling, tightness, or discomfort as you do these exercises. Stop right away if you feel sudden pain or your pain gets worse. Do not begin these exercises until told by your health care provider. ?Stretching and range-of-motion exercise ?This exercise warms up your muscles and joints and improves the movement and flexibility of your back and shoulders. This exercise also helps to relieve pain. ?Chest and spine stretch ? ?Lie down on your back on a firm surface. ?Roll a towel or a small blanket so it is about 4 inches (10 cm) in diameter. ?Put the towel lengthwise under the middle of your back so it is under your spine, but not under your shoulder blades. ?Put your hands behind your head and let your elbows fall to your sides. This will increase your stretch. ?Take a deep breath (inhale). ?Hold for __________ seconds. ?Relax after you breathe out (exhale). ?Repeat __________ times. Complete this exercise __________ times a day. ?Strengthening exercises ?These exercises build strength and endurance in your back and your shoulder blade muscles. Endurance is the ability to use your muscles for a long time, even after they get tired. ?Alternating arm and leg raises ? ?Get on your hands and knees on a firm surface. If you are on a hard floor, you may want to use padding, such as an exercise mat, to cushion your knees. ?Line up your arms and legs. Your hands should be directly below your shoulders, and your knees should be directly below your hips. ?Lift your left leg behind you. At the same time,  raise your right arm and straighten it in front of you. ?Do not lift your leg higher than your hip. ?Do not lift your arm higher than your shoulder. ?Keep your abdominal and back muscles tight. ?Keep your hips facing the ground. ?Do not arch your back. ?Keep your balance carefully, and do not hold your breath. ?Hold for __________ seconds. ?Slowly return to the starting position and repeat with your right leg and your left arm. ?Repeat __________ times. Complete this exercise __________ times a day. ?Straight arm rows ?This exercise is also called shoulder extension exercise. ?Stand with your feet shoulder width apart. ?Secure an exercise band to a stable object in front of you so the band is at or above shoulder height. ?Hold one end of the exercise band in each hand. ?Straighten your elbows and lift your hands up to shoulder height. ?Step back, away from the secured end of the exercise band, until the band stretches. ?Squeeze your shoulder blades together and pull your hands down to the sides of your thighs. Stop when your hands are straight down by your sides. This is shoulder extension. Do not let your hands go behind your body. ?Hold for __________ seconds. ?Slowly return to the starting position. ?Repeat __________ times. Complete this exercise __________ times a day. ?Prone shoulder external rotation ?Lie on your abdomen on a firm bed so your left / right forearm hangs over the edge of the bed and your upper arm is on the bed, straight out from your body. This  is the prone position. ?Your elbow should be bent. ?Your palm should be facing your feet. ?If instructed, hold a __________ weight in your hand. ?Squeeze your shoulder blade toward the middle of your back. Do not let your shoulder lift toward your ear. ?Keep your elbow bent in a 90-degree angle (right angle) while you slowly move your forearm up toward the ceiling. Move your forearm up to the height of the bed, toward your head. This is external  rotation. ?Your upper arm should not move. ?At the top of the movement, your palm should face the floor. ?Hold for __________ seconds. ?Slowly return to the starting position and relax your muscles. ?Repeat __________ times. Complete this exercise __________ times a day. ?Rowing scapular retraction ?This is an exercise in which the shoulder blades (scapulae) are pulled toward each other (retraction). ?Sit in a stable chair without armrests, or stand up. ?Secure an exercise band to a stable object in front of you so the band is at shoulder height. ?Hold one end of the exercise band in each hand. Your palms should face down. ?Bring your arms out straight in front of you. ?Step back, away from the secured end of the exercise band, until the band stretches. ?Pull the band backward. As you do this, bend your elbows and squeeze your shoulder blades together, but avoid letting the rest of your body move. Do not shrug your shoulders upward while you do this. ?Stop when your elbows are at your sides or slightly behind your body. ?Hold for __________ seconds. ?Slowly straighten your arms to return to the starting position. ?Repeat __________ times. Complete this exercise __________ times a day. ?Posture and body mechanics ?Good posture and healthy body mechanics can help to relieve stress in your body's tissues and joints. Body mechanics refers to the movements and positions of your body while you do your daily activities. Posture is part of body mechanics. Good posture means: ?Your spine is in its natural S-curve position (neutral). ?Your shoulders are pulled back slightly. ?Your head is not tipped forward. ?Follow these guidelines to improve your posture and body mechanics in your everyday activities. ?Standing ? ?When standing, keep your spine neutral and your feet about hip width apart. Keep a slight bend in your knees. Your ears, shoulders, and hips should line up with each other. ?When you do a task in which you lean  forward while standing in one place for a long time, place one foot up on a stable object that is 2-4 inches (5-10 cm) high, such as a footstool. This helps keep your spine neutral. ?Sitting ? ?When sitting, keep your spine neutral and keep your feet flat on the floor. Use a footrest, if necessary, and keep your thighs parallel to the floor. Avoid rounding your shoulders, and avoid tilting your head forward. ?When working at a desk or a computer, keep your desk at a height where your hands are slightly lower than your elbows. Slide your chair under your desk so you are close enough to maintain good posture. ?When working at a computer, place your monitor at a height where you are looking straight ahead and you do not have to tilt your head forward or downward to look at the screen. ?Resting ?When lying down and resting, avoid positions that are most painful for you. ?If you have pain with activities such as sitting, bending, stooping, or squatting (flexion-basedactivities), lie in a position in which your body does not bend very much. For example, avoid curling  up on your side with your arms and knees near your chest (fetal position). ?If you have pain with activities such as standing for a long time or reaching with your arms (extension-basedactivities), lie with your spine in a neutral position and bend your knees slightly. Try the following positions: ?Lie on your side with a pillow between your knees. ?Lie on your back with a pillow under your knees. ? ?Lifting ? ?When lifting objects, keep your feet at least shoulder width apart and tighten your abdominal muscles. ?Bend your knees and hips and keep your spine neutral. It is important to lift using the strength of your legs, not your back. Do not lock your knees straight out. ?Always ask for help to lift heavy or awkward objects. ?This information is not intended to replace advice given to you by your health care provider. Make sure you discuss any questions you  have with your health care provider. ?Document Revised: 04/14/2018 Document Reviewed: 01/30/2018 ?Elsevier Patient Education ? [place]. ? ?

## 2021-03-26 NOTE — Progress Notes (Addendum)
? ?             Established Patient Visit ? ?Patient: Kim Delgado   DOB: [birth year]   46 y.o. Female  MRN: [id number] ?Visit Date: 03/31/2021 ? ?Subjective:  ?  ?Chief Complaint  ?Patient presents with  ? Follow-up  ?  2 month f/u ?Pt states she still has the pain/pinching in the right side and back.  ?Pt will sign release for eye doctor  ? ?HPI ?Diabetes ([place]) ?Fasting glucose 701X ?trulicity d/c due to gallstones and elevated lipase, resolved RUQ pain ?Current use of metformin and glipizide ?No adverse side effects. ?Last hgbA1c of 9.9 ? ?Repeat hgbA1c and lipase ?Maintain current med dose ? ?Acquired hypothyroidism ?Repeat TSH and T4 ? ?Acute right flank pain ?Onset 12month ago, intermittent, improves with use of zanaflex and ultracet. ?Did not take gabapentin or NSAIDs. ?No rash or change in skin coloration. ?CT ABD/pelvis done 01/2021: No acute findings within the abdomen or pelvis. 2 small dependent gallstones. No gallbladder wall thickening or pericholecystic fluid or inflammation. No bile duct dilation. Mild hepatomegaly and diffuse hepatic steatosis.No adrenal masses. Kidneys normal in size,orientation and position. No renal mass, stone or hydronephrosis. Normal ureters. Normal bladder. No CT evidence of significant disc bulging or of a disc herniation. No evidence of lumbar nerve root impingement. ?Colonoscopy done 02/2021: polyps removed, repeat in 542yr? ?Advised to use naproxen and zanaflex as needed, start back stretching exercise, and maintain proper posture when sitting. ?Refer for outpt PT if no improvement in 57m51month ?Elevated lipase ?Increase in lipase: 65 to 145 ?CT ABD showed gallstones and fatty liver. ?Persistent right flank pain. ? ?Repeat ABD [place] about ETOH consumption? ? ? ?Reviewed medical, surgical, and social history today ? ?Medications: ?Outpatient Medications Prior to Visit  ?Medication Sig  ? Black Currant Seed Oil 500 MG CAPS Take by mouth.  ? Multiple Vitamins-Minerals  (MULTIVITAMIN ADULT PO) Take by mouth.  ? OVER THE COUNTER MEDICATION   ? [DISCONTINUED] EUTHYROX 150 MCG tablet TAKE 1 TABLET BY MOUTH ONCE DAILY BEFORE  BREAKFAST  ? [DISCONTINUED] glipiZIDE (GLUCOTROL) 5 MG tablet Take 1 tablet (5 mg total) by mouth 2 (two) times daily with breakfast and lunch.  ? [DISCONTINUED] metFORMIN (GLUCOPHAGE) 1000 MG tablet TAKE 1 TABLET BY MOUTH TWICE DAILY WITH A MEAL ** APPOINTMENT NEEDED FOR ADDITIONAL REFILLS**  ? [DISCONTINUED] naproxen (NAPROSYN) 375 MG tablet Take 1 tablet (375 mg total) by mouth 2 (two) times daily.  ? [DISCONTINUED] tiZANidine (ZANAFLEX) 4 MG tablet Take 1 tablet (4 mg total) by mouth every 8 (eight) hours as needed for muscle spasms.  ? ?No facility-administered medications prior to visit.  ? ?Reviewed past medical and social history.  ? ?ROS per HPI above ? ? ?   ?Objective:  ?BP 124/86 (BP Location: Left Arm, Patient Position: Sitting, Cuff Size: Large)   Pulse 86   Temp (!) 96.8 ?F (36 ?C) (Temporal)   Ht '5\' 8"'$  (1.727 m)   Wt 264 lb (119.7 kg)   SpO2 98%   BMI 40.14 kg/m?  ? ?  ? ?Physical Exam ?Cardiovascular:  ?   Rate and Rhythm: Normal rate.  ?   Pulses: Normal pulses.  ?Pulmonary:  ?   Effort: Pulmonary effort is normal.  ?Abdominal:  ?   General: There is no distension.  ?   Palpations: There is no mass.  ?   Tenderness: There is no right CVA tenderness, left CVA tenderness, guarding or rebound.  ?  Hernia: No hernia is present.  ?Musculoskeletal:  ?   Thoracic back: Tenderness present. No bony tenderness. Normal range of motion. No scoliosis.  ?   Lumbar back: Tenderness present. No bony tenderness. Normal range of motion. Negative right straight leg raise test and negative left straight leg raise test. No scoliosis.  ?     Back: ? ?Skin: ?   General: Skin is warm and dry.  ?   Findings: No erythema or rash.  ?Neurological:  ?   Mental Status: She is alert and oriented to person, place, and time.  ?  ?Results for orders placed or performed in  visit on 03/26/21  ?Lipase  ?Result Value Ref Range  ? Lipase 145.0 (H) 11.0 - 59.0 U/L  ?Hemoglobin A1c  ?Result Value Ref Range  ? Hgb A1c MFr Bld 7.0 (H) 4.6 - 6.5 %  ?TSH  ?Result Value Ref Range  ? TSH 0.91 0.35 - 5.50 uIU/mL  ?T4, free  ?Result Value Ref Range  ? Free T4 1.15 0.60 - 1.60 ng/dL  ? ?   ?Assessment & Plan:  ?  ?Problem List Items Addressed This Visit   ? ?  ? Endocrine  ? Acquired hypothyroidism  ?  Repeat TSH and T4 ?  ?  ? Relevant Medications  ? EUTHYROX 150 MCG tablet  ? Other Relevant Orders  ? TSH (Completed)  ? T4, free (Completed)  ? Diabetes ([place])  ?  Fasting glucose 656C ?trulicity d/c due to gallstones and elevated lipase, resolved RUQ pain ?Current use of metformin and glipizide ?No adverse side effects. ?Last hgbA1c of 9.9 ? ?Repeat hgbA1c and lipase ?Maintain current med dose ?  ?  ? Relevant Medications  ? metFORMIN (GLUCOPHAGE) 1000 MG tablet  ? glipiZIDE (GLUCOTROL) 5 MG tablet  ? Other Relevant Orders  ? Hemoglobin A1c (Completed)  ?  ? Other  ? Acute right flank pain - Primary  ?  Onset 32month ago, intermittent, improves with use of zanaflex and ultracet. ?Did not take gabapentin or NSAIDs. ?No rash or change in skin coloration. ?CT ABD/pelvis done 01/2021: No acute findings within the abdomen or pelvis. 2 small dependent gallstones. No gallbladder wall thickening or pericholecystic fluid or inflammation. No bile duct dilation. Mild hepatomegaly and diffuse hepatic steatosis.No adrenal masses. Kidneys normal in size,orientation and position. No renal mass, stone or hydronephrosis. Normal ureters. Normal bladder. No CT evidence of significant disc bulging or of a disc herniation. No evidence of lumbar nerve root impingement. ?Colonoscopy done 02/2021: polyps removed, repeat in 560yr? ?Advised to use naproxen and zanaflex as needed, start back stretching exercise, and maintain proper posture when sitting. ?Refer for outpt PT if no improvement in 64m35month  ?  ? Relevant  Medications  ? naproxen (NAPROSYN) 500 MG tablet  ? tiZANidine (ZANAFLEX) 4 MG tablet  ? Other Relevant Orders  ? [place] RUQ (LIVER/GB)  ? Elevated lipase  ?  Increase in lipase: 65 to 145 ?CT ABD showed gallstones and fatty liver. ?Persistent right flank pain. ? ?Repeat ABD [place] about ETOH consumption? ?  ?  ? Relevant Orders  ? Lipase (Completed)  ? [place] RUQ (LIVER/GB)  ? ?Return in about 3 months (around 06/26/2021) for DM and HTN, hyperlipidemia (fasting). ? ?  ? ?ChaWilfred LacyP ? ? ?

## 2021-03-27 LAB — TSH: TSH: 0.91 u[IU]/mL (ref 0.35–5.50)

## 2021-03-27 LAB — HEMOGLOBIN A1C: Hgb A1c MFr Bld: 7 % — ABNORMAL HIGH (ref 4.6–6.5)

## 2021-03-27 LAB — LIPASE: Lipase: 145 U/L — ABNORMAL HIGH (ref 11.0–59.0)

## 2021-03-27 LAB — T4, FREE: Free T4: 1.15 ng/dL (ref 0.60–1.60)

## 2021-03-31 ENCOUNTER — Encounter: Payer: Self-pay | Admitting: Nurse Practitioner

## 2021-03-31 DIAGNOSIS — R748 Abnormal levels of other serum enzymes: Secondary | ICD-10-CM | POA: Insufficient documentation

## 2021-03-31 MED ORDER — METFORMIN HCL 1000 MG PO TABS: ORAL_TABLET | ORAL | 1 refills | Status: DC

## 2021-03-31 MED ORDER — GLIPIZIDE 5 MG PO TABS: 5.0000 mg | ORAL_TABLET | Freq: Two times a day (BID) | ORAL | 1 refills | Status: DC

## 2021-03-31 MED ORDER — EUTHYROX 150 MCG PO TABS: 150.0000 ug | ORAL_TABLET | Freq: Every day | ORAL | 1 refills | Status: DC

## 2021-03-31 NOTE — Assessment & Plan Note (Signed)
Increase in lipase: 65 to 145 ?CT ABD showed gallstones and fatty liver. ?Persistent right flank pain. ? ?Repeat ABD [place] ?Inquire about ETOH consumption? ?

## 2021-03-31 NOTE — Addendum Note (Signed)
Addended by: Wilfred Lacy L on: 03/31/2021 10:06 AM ? ? Modules accepted: Orders ? ?

## 2021-04-01 ENCOUNTER — Ambulatory Visit: Payer: BC Managed Care – PPO | Admitting: Nurse Practitioner

## 2021-04-01 ENCOUNTER — Encounter: Payer: Self-pay | Admitting: Nurse Practitioner

## 2021-04-01 VITALS — BP 130/82 | HR 88 | Temp 96.4°F | Ht 68.0 in | Wt 264.0 lb

## 2021-04-01 DIAGNOSIS — R748 Abnormal levels of other serum enzymes: Secondary | ICD-10-CM | POA: Diagnosis not present

## 2021-04-01 DIAGNOSIS — D2339 Other benign neoplasm of skin of other parts of face: Secondary | ICD-10-CM | POA: Diagnosis not present

## 2021-04-01 NOTE — Assessment & Plan Note (Signed)
No ETOH use ?Ordered ABD [place] and GI referral ?

## 2021-04-01 NOTE — Assessment & Plan Note (Signed)
Right side. Reports sometimes more prominent. Has not noticed any specific pattern. ?No pain, no redness, no change in size. ?No facial asymmetry, no skin indentation ? 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 Cyst may appear more prominent after new braiding hairstyle. Advised a dermatology referral is needed if pain or change in size/shape, or redness is present. ?

## 2021-04-01 NOTE — Progress Notes (Signed)
? ?             Established Patient Visit ? ?Patient: Kim Delgado   DOB: 12-25-1975   46 y.o. Female  MRN: [id number] ?Visit Date: 04/01/2021 ? ?Subjective:  ?  ?Chief Complaint  ?Patient presents with  ? Acute Visit  ?  Pt states she does not have any pain or swelling today, but states it does come and go. Pt states the swelling is typically over the right eyebrow and said at times it looks like it is a knot on her head.  ?Pt states she has noticed when she does not take the black seed oil or sea moss pills she feels like she swells more.   ? ?HPI ?Dermoid cyst of forehead ?Right side. Reports sometimes more prominent. Has not noticed any specific pattern. ?No pain, no redness, no change in size. ?No facial asymmetry, no skin indentation ? 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 Cyst may appear more prominent after new braiding hairstyle. Advised a dermatology referral is needed if pain or change in size/shape, or redness is present. ? ?Elevated lipase ?No ETOH use ?Ordered ABD [place] and GI referral ? ?Reviewed medical, surgical, and social history today ? ?Medications: ?Outpatient Medications Prior to Visit  ?Medication Sig  ? Black Currant Seed Oil 500 MG CAPS Take by mouth.  ? EUTHYROX 150 MCG tablet Take 1 tablet (150 mcg total) by mouth daily before breakfast.  ? glipiZIDE (GLUCOTROL) 5 MG tablet Take 1 tablet (5 mg total) by mouth 2 (two) times daily with breakfast and lunch.  ? metFORMIN (GLUCOPHAGE) 1000 MG tablet TAKE 1 TABLET BY MOUTH TWICE DAILY WITH A MEAL  ? Multiple Vitamins-Minerals (MULTIVITAMIN ADULT PO) Take by mouth.  ? naproxen (NAPROSYN) 500 MG tablet Take 1 tablet (500 mg total) by mouth 2 (two) times daily as needed.  ? OVER THE COUNTER MEDICATION   ? tiZANidine (ZANAFLEX) 4 MG tablet Take 1 tablet (4 mg total) by mouth every 8 (eight) hours as needed for muscle spasms.  ? ?No facility-administered medications prior to visit.  ? ?Reviewed past medical and social history.  ? ?ROS per HPI  above ? ? ?   ?Objective:  ?There were no vitals taken for this visit. ? ?  ? ?Physical Exam ?HENT:  ?   Head: Normocephalic.  ? ?   Comments: Dime sized Cyst on right side forehead, mobile, non tender, no skin retraction ?Eyes:  ?   General: Lids are normal.  ?   Extraocular Movements:  ?   Right eye: Normal extraocular motion and no nystagmus.  ?   Left eye: Normal extraocular motion and no nystagmus.  ?   Conjunctiva/sclera: Conjunctivae normal.  ?  ?No results found for any visits on 04/01/21. ?   ?Assessment & Plan:  ?  ?Problem List Items Addressed This Visit   ? ?  ? Musculoskeletal and Integument  ? Dermoid cyst of forehead - Primary  ?  Right side. Reports sometimes more prominent. Has not noticed any specific pattern. ?No pain, no redness, no change in size. ?No facial asymmetry, no skin indentation ? 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 Cyst may appear more prominent after new braiding hairstyle. Advised a dermatology referral is needed if pain or change in size/shape, or redness is present. ?  ?  ?  ? Other  ? Elevated lipase  ?  No ETOH use ?Ordered ABD [place] and GI referral ?  ?  ?  Relevant Orders  ? Ambulatory referral to Gastroenterology  ? ?Return if symptoms worsen or fail to improve. ? ?  ? ?Wilfred Lacy, NP ? ? ?

## 2021-04-01 NOTE — Patient Instructions (Addendum)
Cyst on forehead is benign. Call office for referral to dermatology if pain, redness, change in shape or size. ? ?Epidermoid Cyst Drainage ?Epidermoid cyst drainage is a procedure to drain a fluid-filled sac that forms under your skin (epidermoid cyst). This type of cyst is filled with a thick, oily substance that is secreted by your skin glands. Epidermoid cysts are usually painless. You can often move the cyst under your skin. Sometimes an epidermoid cyst gets inflamed. It may become red, swollen, and painful. In this case, you may need this procedure to drain the cyst and provide relief from the discomfort caused by an inflamed cyst. ?Cysts that are treated only with drainage often come back (recur). You may need to have the cyst completely removed after healing from this procedure. ?Tell a health care provider about: ?Any allergies you have. ?All medicines you are taking, including vitamins, herbs, eye drops, creams, and over-the-counter medicines. ?Any problems you or family members have had with anesthetic medicines. ?Any blood disorders you have. ?Any surgeries you have had. ?Any medical conditions you have. ?Whether you are pregnant or may be pregnant. ?What are the risks? ?Generally, this is a safe procedure. However, problems may occur, including: ?Cyst recurrence. ?Infection. ?Bleeding. ?Allergic reactions to medicines. ?What happens before the procedure? ?Ask your health care provider about: ?Changing or stopping your regular medicines. This is especially important if you are taking diabetes medicines or blood thinners. ?Taking medicines such as aspirin and ibuprofen. These medicines can thin your blood. Do not take these medicines unless your health care provider tells you to take them. ?Taking over-the-counter medicines, vitamins, herbs, and supplements. ?Ask your heath care provider what steps will be taken to help prevent infection. This may include washing your skin with a germ-killing soap. ?What  happens during the procedure? ?The skin around the cyst will be injected with a numbing medicine (local anesthetic). ?An incision will be made over the cyst, and the wall of the cyst will be opened. ?A spreading instrument will be used to open up the cyst. ?The contents of the cyst will be removed with suction or irrigation. ?A thin strip of gauze packing may be placed in the cyst to keep it open and draining. ?The incision will be left open, and the cyst will be covered with a bandage (dressing). ?The procedure may vary among health care providers and hospitals. ?What can I expect after the procedure? ?After the procedure, it is common to have: ?Soreness. ?Blood-tinged fluid draining from the cyst. This drainage may stain your dressing. ?Follow these instructions at home: ?Medicines ?Take over-the-counter and prescription medicines only as told by your health care provider. ?If you were prescribed an antibiotic medicine, take it as told by your health care provider. Do not stop taking the antibiotic even if you start to feel better. ?Incision care ? ?Follow instructions from your health care provider about how to take care of your incision. Make sure you: ?Wash your hands with soap and water for at least 20 seconds before and after you change your dressing. If soap and water are not available, use hand sanitizer. ?Change your dressing as told by your health care provider. ?Do not remove the packing. Do not try to put it back in if it falls out. ?You may need to return to your health care provider in a few days to have the packing removed. ?After your packing is removed, follow instructions from your health care provider about how to keep your incision area clean. ?  Check your incision area every day for signs of infection. Check for: ?Redness, swelling, or pain. ?More fluid or blood. ?Warmth. ?Pus or a bad smell. ?General instructions ?Return to your normal activities as told by your health care provider. Ask your  health care provider what activities are safe for you. ?Do not take baths, swim, or use a hot tub until your health care provider approves. Ask your health care provider if you may take showers. ?You may need to return to your health care provider to have the cyst removed after you heal from the drainage procedure. ?Keep all follow-up visits. This is important. ?Contact a health care provider if: ?You have chills or a fever. ?Blood soaks through your dressing. ?You have any signs of infection, especially spreading redness or increased pus coming from the cyst. ?Summary ?Epidermoid cyst drainage is a procedure to drain a fluid-filled sac that forms underneath your skin. ?If an epidermoid cyst gets inflamed, you may need to have a procedure to drain the cyst and relieve the discomfort caused by an inflamed cyst. During epidermoid cyst drainage, you will get local anesthesia so the cyst can be opened and the contents removed. ?You may have packing gauze put in the cyst to keep it open and draining. ?If you were prescribed an antibiotic medicine, take it as told by your health care provider. Do not stop taking the antibiotic even if you start to feel better. ?Epidermoid cysts often come back after drainage. You may need to have the cyst removed after you heal from the drainage procedure. ?This information is not intended to replace advice given to you by your health care provider. Make sure you discuss any questions you have with your health care provider. ?Document Revised: 03/28/2019 Document Reviewed: 03/28/2019 ?Elsevier Patient Education ? [employer]. ? ?

## 2021-04-02 ENCOUNTER — Ambulatory Visit
Admission: RE | Admit: 2021-04-02 | Discharge: 2021-04-02 | Disposition: A | Discharge status: Home / Self Care | Payer: BC Managed Care – PPO | Source: Ambulatory Visit | Attending: Nurse Practitioner | Admitting: Nurse Practitioner

## 2021-04-02 DIAGNOSIS — R748 Abnormal levels of other serum enzymes: Secondary | ICD-10-CM

## 2021-04-02 DIAGNOSIS — R109 Unspecified abdominal pain: Secondary | ICD-10-CM

## 2021-04-02 DIAGNOSIS — K8020 Calculus of gallbladder without cholecystitis without obstruction: Secondary | ICD-10-CM | POA: Diagnosis not present

## 2021-04-03 ENCOUNTER — Encounter: Payer: Self-pay | Admitting: Nurse Practitioner

## 2021-04-03 DIAGNOSIS — K8000 Calculus of gallbladder with acute cholecystitis without obstruction: Secondary | ICD-10-CM

## 2021-04-06 ENCOUNTER — Telehealth: Payer: Self-pay | Admitting: Nurse Practitioner

## 2021-04-06 NOTE — Telephone Encounter (Signed)
Pt states she also would like to know if she could get a Rx for tramadol because it helps also. She states when she does the liquid diet she feels good but she starts to get sick because it is not filling her up.  ? ?Spoke with the referrals team and was informed she needs to wait a few days for the Surgeon office to call her back. Pt states she will see if she can wait until they call but if she still has pain in the morning she will go to the ED.  ? ?

## 2021-04-06 NOTE — Telephone Encounter (Signed)
Pt has gotten a call from a Education officer, environmental. She had no idea she was even needing surgery. She was told to go on a clear liquid diet, she is wondering how long. Please advise pt at [phone]. ?

## 2021-04-13 ENCOUNTER — Encounter: Payer: Self-pay | Admitting: Nurse Practitioner

## 2021-04-13 DIAGNOSIS — E1165 Type 2 diabetes mellitus with hyperglycemia: Secondary | ICD-10-CM

## 2021-04-13 MED ORDER — METFORMIN HCL ER (MOD) 1000 MG PO TB24: 1000.0000 mg | ORAL_TABLET | Freq: Two times a day (BID) | ORAL | 1 refills | Status: DC

## 2021-04-13 NOTE — Telephone Encounter (Signed)
Pharmacy called and said this medication needs a prior authorization ?

## 2021-04-28 ENCOUNTER — Telehealth: Payer: Self-pay | Admitting: Nurse Practitioner

## 2021-04-28 DIAGNOSIS — E1165 Type 2 diabetes mellitus with hyperglycemia: Secondary | ICD-10-CM

## 2021-04-28 NOTE — Telephone Encounter (Signed)
[facility] [phone] is calling to see if pt can have this script  ?metFORMIN (GLUMETZA) 1000 MG (MOD) 24 hr tablet [[id number]]  ?Changed to Metformin er'500mg'$  2 tablets 2 times a day.   ?The PA was done for the '1000mg'$ ;however,it is still over $100 for the pt.Marland Kitchen  ?

## 2021-05-01 ENCOUNTER — Encounter: Payer: Self-pay | Admitting: *Deleted

## 2021-05-04 MED ORDER — METFORMIN HCL ER 500 MG PO TB24: 1000.0000 mg | ORAL_TABLET | Freq: Two times a day (BID) | ORAL | 1 refills | Status: DC

## 2021-05-04 NOTE — Telephone Encounter (Signed)
Can you change this due to insurance issues? ?

## 2021-05-06 ENCOUNTER — Ambulatory Visit: Payer: BC Managed Care – PPO | Admitting: Internal Medicine

## 2021-05-06 ENCOUNTER — Encounter: Payer: Self-pay | Admitting: Internal Medicine

## 2021-05-06 VITALS — BP 112/74 | HR 80 | Ht 68.0 in | Wt 263.0 lb

## 2021-05-06 DIAGNOSIS — K5900 Constipation, unspecified: Secondary | ICD-10-CM

## 2021-05-06 DIAGNOSIS — K760 Fatty (change of) liver, not elsewhere classified: Secondary | ICD-10-CM

## 2021-05-06 DIAGNOSIS — K8020 Calculus of gallbladder without cholecystitis without obstruction: Secondary | ICD-10-CM | POA: Diagnosis not present

## 2021-05-06 DIAGNOSIS — R748 Abnormal levels of other serum enzymes: Secondary | ICD-10-CM

## 2021-05-06 DIAGNOSIS — K648 Other hemorrhoids: Secondary | ICD-10-CM

## 2021-05-06 DIAGNOSIS — R1011 Right upper quadrant pain: Secondary | ICD-10-CM | POA: Diagnosis not present

## 2021-05-06 NOTE — Progress Notes (Signed)
? ?Subjective:  ? ? Patient ID: Kim Delgado, female    DOB: March 20, 1975, 46 y.o.   MRN: [id number] ? ?HPI ?Kim Delgado is a 46 year old female with a history of symptomatic internal hemorrhoids with bleeding, history of sessile serrated colon polyps, who is here for follow-up and to consider additional hemorrhoidal banding.  She also reports today that she has been having ongoing issues with upper abdominal pain particularly right-sided and was told that she has gallstones. ? ?She had banding to the left lateral internal hemorrhoid column on 03/24/2021.  Since her hemorrhoidal banding her hemorrhoidal bleeding symptom has completely resolved.  Her bowel movements have been bit better since adding Metamucil and Colace.  Occasionally she will use senna in addition to Metamucil and Colace.  She will also occasionally use prune juice.  Bowel movements have been easier but not necessarily quicker. ? ?Separate from this she has had ongoing intermittent right upper quadrant abdominal pain.  This radiates to her back and right shoulder even down her right arms at times.  She has been told that she has gallstones but is also had an elevated lipase.  She has had previous ultrasound and CT scan of the abdomen pelvis. ? ?This right upper quadrant pain really seem to start after having several sips of wine on New Year's Eve 2022.  She developed a burning right upper quadrant pain which worsened.  She was initially told this was muscular though CT scan did show gallstones without cholecystitis.  She was treated with muscle relaxers, naproxen, tramadol and using ice and heat.  She has had episodic episodes of this recently.  Does seem to be worse with fatty foods. ? ?She has been referred to [facility] but this appointment is not until tomorrow ? ? ?Review of Systems ?As per HPI, otherwise negative ? ?Current Medications, Allergies, Past Medical History, Past Surgical History, Family History and Social History were  reviewed in [employer] record. ?   ?Objective:  ? Physical Exam ?BP 112/74   Pulse 80   Ht '5\' 8"'$  (1.727 m)   Wt 263 lb (119.3 kg)   BMI 39.99 kg/m?  ?Gen: awake, alert, NAD ?HEENT: anicteric  ?Abd: soft, mildly tender right upper quadrant, nondistended, +BS throughout ?Ext: no c/c/e ?Neuro: nonfocal ? ?Lipase  ?   ?Component Value Date/Time  ? LIPASE 145.0 (H) 03/26/2021 1433  ?Lipase 3 months ago 65 ? ? ?  Latest Ref Rng & Units 01/09/2021  ? 12:17 PM 06/11/2020  ? 11:37 AM 02/04/2018  ? 11:01 AM  ?CBC  ?WBC 4.0 - 10.5 K/uL 6.8   6.9   7.3    ?Hemoglobin 12.0 - 15.0 g/dL 13.0   13.0   12.4    ?Hematocrit 36.0 - 46.0 % 40.5   39.6   37.3    ?Platelets 150.0 - 400.0 K/uL 373.0   338.0     ? ?CMP  ?   ?Component Value Date/Time  ? NA 137 01/09/2021 1217  ? NA 139 06/10/2017 0815  ? K 3.9 01/09/2021 1217  ? CL 101 01/09/2021 1217  ? CO2 28 01/09/2021 1217  ? GLUCOSE 161 (H) 01/09/2021 1217  ? BUN 6 01/09/2021 1217  ? BUN 9 06/10/2017 0815  ? CREATININE 0.67 01/09/2021 1217  ? CREATININE 0.65 09/16/2015 0853  ? CALCIUM 9.4 01/09/2021 1217  ? PROT 7.3 01/09/2021 1217  ? PROT 7.2 06/10/2017 0815  ? ALBUMIN 4.5 01/09/2021 1217  ? ALBUMIN 4.4 06/10/2017  0815  ? AST 19 01/09/2021 1217  ? ALT 24 01/09/2021 1217  ? ALKPHOS 89 01/09/2021 1217  ? BILITOT 0.5 01/09/2021 1217  ? BILITOT 0.3 06/10/2017 0815  ? GFRNONAA 112 06/10/2017 0815  ? GFRNONAA >89 09/16/2015 0853  ? GFRAA 129 06/10/2017 0815  ? GFRAA >89 09/16/2015 0853  ? ?CT ABDOMEN AND PELVIS WITHOUT CONTRAST ?  ?TECHNIQUE: ?Multidetector CT imaging of the abdomen and pelvis was performed ?following the standard protocol without IV contrast. ?  ?COMPARISON:  07/20/2009 ?  ?FINDINGS: ?Lower chest: No acute abnormality. ?  ?Hepatobiliary: Liver mildly enlarged, 25 cm transversely. Liver ?demonstrates diffuse decreased attenuation consistent with fatty ?infiltration. Focal fatty sparing is noted adjacent to the ?gallbladder. No liver mass. 2 small  dependent gallstones. No ?gallbladder wall thickening or pericholecystic fluid or ?inflammation. No bile duct dilation. ?  ?Pancreas: Unremarkable. No pancreatic ductal dilatation or ?surrounding inflammatory changes. ?  ?Spleen: Normal in size without focal abnormality. ?  ?Adrenals/Urinary Tract: No adrenal masses. Kidneys normal in size, ?orientation and position. No renal mass, stone or hydronephrosis. ?Normal ureters. Normal bladder. ?  ?Stomach/Bowel: Stomach is within normal limits. Appendix appears ?normal. No evidence of bowel wall thickening, distention, or ?inflammatory changes. ?  ?Vascular/Lymphatic: No significant vascular findings are present. No ?enlarged abdominal or pelvic lymph nodes. ?  ?Reproductive: Status post hysterectomy. No adnexal masses. ?  ?Other: No abdominal wall hernia or abnormality. No abdominopelvic ?ascites. ?  ?Musculoskeletal: No skeletal abnormality. No CT evidence of ?significant disc bulging or of a disc herniation. No evidence of ?lumbar nerve root impingement. ?  ?IMPRESSION: ?1. No acute findings within the abdomen or pelvis. No findings to ?account for the patient's right lower back and right leg pain. ?2. Mild hepatomegaly and diffuse hepatic steatosis. ?  ?  ?Electronically Signed ?  By: Lajean Manes M.D. ?  On: 01/10/2021 09:06 ?  ? ?ULTRASOUND ABDOMEN LIMITED RIGHT UPPER QUADRANT ?  ?COMPARISON:  CT from 01/10/2021 ?  ?FINDINGS: ?Gallbladder: ?  ?Multiple stones seen within the gallbladder lumen, largest of which ?measures 8 mm. Small amount of sludge noted as well. Gallbladder ?wall within normal limits measuring 1.6 mm. No free pericholecystic ?fluid. No sonographic Murphy sign indicated by the sonographer. ?  ?Common bile duct: ?  ?Diameter: 3.3 mm ?  ?Liver: ?  ?No focal lesion identified. Dense echogenic echotexture seen within ?the hepatic parenchyma. Focal fatty sparing noted adjacent to the ?gallbladder fossa. Portal vein is patent on color Doppler  imaging ?with normal direction of blood flow towards the liver. ?  ?Other: None. ?  ?IMPRESSION: ?1. Cholelithiasis with small amount of gallbladder sludge. No ?sonographic features to suggest acute cholecystitis. ?2. No biliary dilatation. ?3. Dense echogenic echotexture within the hepatic parenchyma, ?suggesting steatosis. ?  ?  ?Electronically Signed ?  By: Jeannine Boga M.D. ?  On: 04/03/2021 04:15 ? ? ? ?   ?Assessment & Plan:  ?46 year old female with a history of symptomatic internal hemorrhoids with bleeding, history of sessile serrated colon polyps, who is here for follow-up and to consider additional hemorrhoidal banding but also discussed elevated lipase, right upper quadrant abdominal pain symptomatic gallstones ? ?Elevated lipase/symptomatic gallstones --her symptoms are consistent with biliary colic, intermittent in nature and she also has gallstones seen by imaging both CT and recent ultrasound.  Pancreas was normal by CT without contrast in January.  We discussed how gallstones can cause pain but also intermittent biliary obstruction or pancreatitis.  She does not meet  the clinical diagnosis of pancreatitis though lipase has been elevated on 2 occasions and most recently approximately 3 times the upper limit of normal.  I have recommended the following: ?--Keep the surgical consultation scheduled for tomorrow to discuss cholecystectomy; they can decide whether to perform cholecystectomy with IOC versus preoperative MRI/MRCP ?--Assuming cholecystectomy then follow-up lipase 6 weeks after surgery to ensure normalization; if not repeat cross-sectional imaging ?--Continue low-fat diet in the interim ?--Her Trulicity has been stopped over concern of possibly causing elevated lipase ? ?2.  Bleeding internal hemorrhoid --hemorrhoid bleeding has ceased despite being with every bowel movement after only 1 hemorrhoidal banding treatment.  Given resolution of symptoms we will observe and not repeat  banding unless clinically needed in the future ?--Resolved after hemorrhoidal banding x1; additional hemorrhoidal banding in the future as needed ? ?3.  Constipation --mild and improved with Metamucil, Colace and occasional senna p

## 2021-05-06 NOTE — Patient Instructions (Signed)
Follow up as needed ? ?Thank you for choosing me and [facility]. ? ?Dr.Jay Pyrtle  ? ?

## 2021-05-07 ENCOUNTER — Ambulatory Visit: Payer: Self-pay | Admitting: General Surgery

## 2021-05-07 DIAGNOSIS — K760 Fatty (change of) liver, not elsewhere classified: Secondary | ICD-10-CM | POA: Diagnosis not present

## 2021-05-07 DIAGNOSIS — R748 Abnormal levels of other serum enzymes: Secondary | ICD-10-CM

## 2021-05-07 DIAGNOSIS — K8020 Calculus of gallbladder without cholecystitis without obstruction: Secondary | ICD-10-CM | POA: Diagnosis not present

## 2021-05-13 NOTE — Pre-Procedure Instructions (Signed)
Surgical Instructions ? ? ? Your procedure is scheduled on Monday, May 15th. ? Report to [facility] Main Entrance "A" at 10:00 A.M., then check in with the Admitting office. ? Call this number if you have problems the morning of surgery: ? [phone] ? ? If you have any questions prior to your surgery date call [phone]: Open Monday-Friday 8am-4pm ? ? ? Remember: ? Do not eat after midnight the night before your surgery ? ?You may drink clear liquids until 09:00 AM the morning of your surgery.   ?Clear liquids allowed are: Water, Non-Citrus Juices (without pulp), Carbonated Beverages, Clear Tea, Black Coffee Only (NO MILK, CREAM OR POWDERED CREAMER of any kind), and Gatorade. ?  ? Take these medicines the morning of surgery with A SIP OF WATER  ?EUTHYROX  ?sodium chloride (OCEAN) 0.65 % SOLN nasal spray- if needed ?tiZANidine (ZANAFLEX)- if needed ? ?As of today, STOP taking any Aspirin (unless otherwise instructed by your surgeon) Aleve, Naproxen, Ibuprofen, Motrin, Advil, Goody's, BC's, all herbal medications, fish oil, and all vitamins. ? ? ?WHAT DO I DO ABOUT MY DIABETES MEDICATION? ? ? ?Do not take glipiZIDE (GLUCOTROL) or metFORMIN (GLUCOPHAGE-XR) the morning of surgery. ? ? ? ?HOW TO MANAGE YOUR DIABETES ?BEFORE AND AFTER SURGERY ? ?Why is it important to control my blood sugar before and after surgery? ?Improving blood sugar levels before and after surgery helps healing and can limit problems. ?A way of improving blood sugar control is eating a healthy diet by: ? Eating less sugar and carbohydrates ? Increasing activity/exercise ? Talking with your doctor about reaching your blood sugar goals ?High blood sugars (greater than 180 mg/dL) can raise your risk of infections and slow your recovery, so you will need to focus on controlling your diabetes during the weeks before surgery. ?Make sure that the doctor who takes care of your diabetes knows about your planned surgery including the date and  location. ? ?How do I manage my blood sugar before surgery? ?Check your blood sugar at least 4 times a day, starting 2 days before surgery, to make sure that the level is not too high or low. ? ?Check your blood sugar the morning of your surgery when you wake up and every 2 hours until you get to the Short Stay unit. ? ?If your blood sugar is less than 70 mg/dL, you will need to treat for low blood sugar: ?Do not take insulin. ?Treat a low blood sugar (less than 70 mg/dL) with ? cup of clear juice (cranberry or apple), 4 glucose tablets, OR glucose gel. ?Recheck blood sugar in 15 minutes after treatment (to make sure it is greater than 70 mg/dL). If your blood sugar is not greater than 70 mg/dL on recheck, call [phone] for further instructions. ?Report your blood sugar to the short stay nurse when you get to Short Stay. ? ?If you are admitted to the hospital after surgery: ?Your blood sugar will be checked by the staff and you will probably be given insulin after surgery (instead of oral diabetes medicines) to make sure you have good blood sugar levels. ?The goal for blood sugar control after surgery is 80-180 mg/dL.  ?         ?           ?Do NOT Smoke (Tobacco/Vaping) for 24 hours prior to your procedure. ? ?If you use a CPAP at night, you may bring your mask/headgear for your overnight stay. ?  ?Contacts, glasses, piercing's, hearing aid's,  dentures or partials may not be worn into surgery, please bring cases for these belongings.  ?  ?For patients admitted to the hospital, discharge time will be determined by your treatment team. ?  ?Patients discharged the day of surgery will not be allowed to drive home, and someone needs to stay with them for 24 hours. ? ?SURGICAL WAITING ROOM VISITATION ?Patients having surgery or a procedure may have two support people in the waiting room. These visitors may be switched out with other visitors if needed. ?Children under the age of 52 must have an adult accompany them  who is not the patient. ?If the patient needs to stay at the hospital during part of their recovery, the visitor guidelines for inpatient rooms apply. ? ?Please refer to the [facility] website for the visitor guidelines for Inpatients (after your surgery is over and you are in a regular room).  ? ? ?Special instructions:   ?[facility]- Preparing For Surgery ? ?Before surgery, you can play an important role. Because skin is not sterile, your skin needs to be as free of germs as possible. You can reduce the number of germs on your skin by washing with CHG (chlorahexidine gluconate) Soap before surgery.  CHG is an antiseptic cleaner which kills germs and bonds with the skin to continue killing germs even after washing.   ? ?Oral Hygiene is also important to reduce your risk of infection.  Remember - BRUSH YOUR TEETH THE MORNING OF SURGERY WITH YOUR REGULAR TOOTHPASTE ? ?Please do not use if you have an allergy to CHG or antibacterial soaps. If your skin becomes reddened/irritated stop using the CHG.  ?Do not shave (including legs and underarms) for at least 48 hours prior to first CHG shower. It is OK to shave your face. ? ?Please follow these instructions carefully. ?  ?Shower the NIGHT BEFORE SURGERY and the MORNING OF SURGERY ? ?If you chose to wash your hair, wash your hair first as usual with your normal shampoo. ? ?After you shampoo, rinse your hair and body thoroughly to remove the shampoo. ? ?Use CHG Soap as you would any other liquid soap. You can apply CHG directly to the skin and wash gently with a scrungie or a clean washcloth.  ? ?Apply the CHG Soap to your body ONLY FROM THE NECK DOWN.  Do not use on open wounds or open sores. Avoid contact with your eyes, ears, mouth and genitals (private parts). Wash Face and genitals (private parts)  with your normal soap.  ? ?Wash thoroughly, paying special attention to the area where your surgery will be performed. ? ?Thoroughly rinse your body with warm water from  the neck down. ? ?DO NOT shower/wash with your normal soap after using and rinsing off the CHG Soap. ? ?Pat yourself dry with a CLEAN TOWEL. ? ?Wear CLEAN PAJAMAS to bed the night before surgery ? ?Place CLEAN SHEETS on your bed the night before your surgery ? ?DO NOT SLEEP WITH PETS. ? ? ?Day of Surgery: ?Take a shower with CHG soap. ?Do not wear jewelry or makeup ?Do not wear lotions, powders, perfumes, or deodorant. ?Do not shave 48 hours prior to surgery.  ?Do not bring valuables to the hospital.  ?[facility] is not responsible for any belongings or valuables. ?Do not wear nail polish, gel polish, artificial nails, or any other type of covering on natural nails (fingers and toes) ?If you have artificial nails or gel coating that need to be removed by  a nail salon, please have this removed prior to surgery. Artificial nails or gel coating may interfere with anesthesia's ability to adequately monitor your vital signs. ?Wear Clean/Comfortable clothing the morning of surgery ?Remember to brush your teeth WITH YOUR REGULAR TOOTHPASTE. ?  ?Please read over the following fact sheets that you were given. ? ? ? ?If you received a COVID test during your pre-op visit  it is requested that you wear a mask when out in public, stay away from anyone that may not be feeling well and notify your surgeon if you develop symptoms. If you have been in contact with anyone that has tested positive in the last 10 days please notify you surgeon.  ?

## 2021-05-14 ENCOUNTER — Encounter (HOSPITAL_COMMUNITY): Payer: Self-pay

## 2021-05-14 ENCOUNTER — Encounter (HOSPITAL_COMMUNITY)
Admission: RE | Admit: 2021-05-14 | Discharge: 2021-05-14 | Disposition: A | Discharge status: Home / Self Care | Payer: BC Managed Care – PPO | Source: Ambulatory Visit | Attending: General Surgery | Admitting: General Surgery

## 2021-05-14 ENCOUNTER — Other Ambulatory Visit: Payer: Self-pay

## 2021-05-14 VITALS — BP 132/84 | HR 71 | Temp 98.4°F | Resp 17 | Ht 68.0 in | Wt 262.0 lb

## 2021-05-14 DIAGNOSIS — R748 Abnormal levels of other serum enzymes: Secondary | ICD-10-CM

## 2021-05-14 DIAGNOSIS — Z01818 Encounter for other preprocedural examination: Secondary | ICD-10-CM | POA: Diagnosis not present

## 2021-05-14 HISTORY — DX: Hypothyroidism, unspecified: E03.9

## 2021-05-14 LAB — CBC WITH DIFFERENTIAL/PLATELET
Abs Immature Granulocytes: 0.01 10*3/uL (ref 0.00–0.07)
Basophils Absolute: 0 10*3/uL (ref 0.0–0.1)
Basophils Relative: 0 %
Eosinophils Absolute: 0.7 10*3/uL — ABNORMAL HIGH (ref 0.0–0.5)
Eosinophils Relative: 11 %
HCT: 39.1 % (ref 36.0–46.0)
Hemoglobin: 12.9 g/dL (ref 12.0–15.0)
Immature Granulocytes: 0 %
Lymphocytes Relative: 39 %
Lymphs Abs: 2.7 10*3/uL (ref 0.7–4.0)
MCH: 28.8 pg (ref 26.0–34.0)
MCHC: 33 g/dL (ref 30.0–36.0)
MCV: 87.3 fL (ref 80.0–100.0)
Monocytes Absolute: 0.3 10*3/uL (ref 0.1–1.0)
Monocytes Relative: 5 %
Neutro Abs: 3 10*3/uL (ref 1.7–7.7)
Neutrophils Relative %: 45 %
Platelets: 365 10*3/uL (ref 150–400)
RBC: 4.48 MIL/uL (ref 3.87–5.11)
RDW: 13.4 % (ref 11.5–15.5)
WBC: 6.8 10*3/uL (ref 4.0–10.5)
nRBC: 0 % (ref 0.0–0.2)

## 2021-05-14 LAB — GLUCOSE, CAPILLARY: Glucose-Capillary: 155 mg/dL — ABNORMAL HIGH (ref 70–99)

## 2021-05-14 LAB — COMPREHENSIVE METABOLIC PANEL
ALT: 16 U/L (ref 0–44)
AST: 20 U/L (ref 15–41)
Albumin: 4 g/dL (ref 3.5–5.0)
Alkaline Phosphatase: 66 U/L (ref 38–126)
Anion gap: 7 (ref 5–15)
BUN: 10 mg/dL (ref 6–20)
CO2: 27 mmol/L (ref 22–32)
Calcium: 9.2 mg/dL (ref 8.9–10.3)
Chloride: 104 mmol/L (ref 98–111)
Creatinine, Ser: 0.69 mg/dL (ref 0.44–1.00)
GFR, Estimated: >60 mL/min (ref >60)
Glucose, Bld: 159 mg/dL — ABNORMAL HIGH (ref 70–99)
Potassium: 4.1 mmol/L (ref 3.5–5.1)
Sodium: 138 mmol/L (ref 135–145)
Total Bilirubin: 0.9 mg/dL (ref 0.3–1.2)
Total Protein: 7.2 g/dL (ref 6.5–8.1)

## 2021-05-14 LAB — LIPASE, BLOOD: Lipase: 73 U/L — ABNORMAL HIGH (ref 11–51)

## 2021-05-14 NOTE — Progress Notes (Signed)
PCP - Wilfred Lacy, NP ?Cardiologist - denies ? ?PPM/ICD - denies ? ? ?Chest x-ray - 03/02/2001 ?EKG - 05/14/21 at PAT ?Stress Test - denies ?ECHO - denies ?Cardiac Cath - denies ? ?Sleep Study - 2022, OSA- ? ?DM- Type 2 ?Fasting Blood Sugar - pt unsure ?Checks Blood Sugar rarely (spot checks) ? ?ASA/Blood Thinner Instructions: n/a ? ? ?ERAS Protcol - yes, no drink ? ? ?COVID TEST- n/a ? ? ?Anesthesia review: no ? ?Patient denies shortness of breath, fever, cough and chest pain at PAT appointment ? ? ?All instructions explained to the patient, with a verbal understanding of the material. Patient agrees to go over the instructions while at home for a better understanding. Patient also instructed to notify surgeon of any contact with COVID+ person or if she develops any symptoms. The opportunity to ask questions was provided. ?  ?

## 2021-05-18 ENCOUNTER — Encounter (HOSPITAL_COMMUNITY)
Admission: RE | Disposition: A | Discharge status: Home / Self Care | Payer: Self-pay | Source: Ambulatory Visit | Attending: General Surgery

## 2021-05-18 ENCOUNTER — Ambulatory Visit (HOSPITAL_COMMUNITY): Payer: BC Managed Care – PPO | Admitting: Anesthesiology

## 2021-05-18 ENCOUNTER — Ambulatory Visit (HOSPITAL_COMMUNITY)
Admission: RE | Admit: 2021-05-18 | Discharge: 2021-05-18 | Disposition: A | Discharge status: Home / Self Care | Payer: BC Managed Care – PPO | Source: Ambulatory Visit | Attending: General Surgery | Admitting: General Surgery

## 2021-05-18 ENCOUNTER — Ambulatory Visit (HOSPITAL_COMMUNITY): Payer: BC Managed Care – PPO

## 2021-05-18 ENCOUNTER — Other Ambulatory Visit: Payer: Self-pay

## 2021-05-18 DIAGNOSIS — Z6839 Body mass index (BMI) 39.0-39.9, adult: Secondary | ICD-10-CM | POA: Diagnosis not present

## 2021-05-18 DIAGNOSIS — K8020 Calculus of gallbladder without cholecystitis without obstruction: Secondary | ICD-10-CM | POA: Diagnosis not present

## 2021-05-18 DIAGNOSIS — Z87891 Personal history of nicotine dependence: Secondary | ICD-10-CM | POA: Diagnosis not present

## 2021-05-18 DIAGNOSIS — Z833 Family history of diabetes mellitus: Secondary | ICD-10-CM | POA: Insufficient documentation

## 2021-05-18 DIAGNOSIS — E039 Hypothyroidism, unspecified: Secondary | ICD-10-CM | POA: Diagnosis not present

## 2021-05-18 DIAGNOSIS — E119 Type 2 diabetes mellitus without complications: Secondary | ICD-10-CM | POA: Insufficient documentation

## 2021-05-18 DIAGNOSIS — K760 Fatty (change of) liver, not elsewhere classified: Secondary | ICD-10-CM | POA: Diagnosis not present

## 2021-05-18 DIAGNOSIS — Z7984 Long term (current) use of oral hypoglycemic drugs: Secondary | ICD-10-CM | POA: Insufficient documentation

## 2021-05-18 HISTORY — PX: CHOLECYSTECTOMY: SHX55

## 2021-05-18 LAB — GLUCOSE, CAPILLARY
Glucose-Capillary: 140 mg/dL — ABNORMAL HIGH (ref 70–99)
Glucose-Capillary: 178 mg/dL — ABNORMAL HIGH (ref 70–99)

## 2021-05-18 SURGERY — LAPAROSCOPIC CHOLECYSTECTOMY WITH INTRAOPERATIVE CHOLANGIOGRAM
Anesthesia: General

## 2021-05-18 MED ORDER — FENTANYL CITRATE (PF) 100 MCG/2ML IJ SOLN
INTRAMUSCULAR | Status: AC
  Filled 2021-05-18: qty 2

## 2021-05-18 MED ORDER — DEXAMETHASONE SODIUM PHOSPHATE 10 MG/ML IJ SOLN
INTRAMUSCULAR | Status: AC
  Filled 2021-05-18: qty 1

## 2021-05-18 MED ORDER — MIDAZOLAM HCL 5 MG/5ML IJ SOLN
INTRAMUSCULAR | Status: DC | PRN
  Administered 2021-05-18: 2 mg via INTRAVENOUS

## 2021-05-18 MED ORDER — ARTIFICIAL TEARS OPHTHALMIC OINT
TOPICAL_OINTMENT | OPHTHALMIC | Status: DC | PRN
  Administered 2021-05-18: 1 via OPHTHALMIC

## 2021-05-18 MED ORDER — ORAL CARE MOUTH RINSE: 15.0000 mL | Freq: Once | OROMUCOSAL | Status: AC

## 2021-05-18 MED ORDER — CHLORHEXIDINE GLUCONATE CLOTH 2 % EX PADS: 6.0000 | MEDICATED_PAD | Freq: Once | CUTANEOUS | Status: DC

## 2021-05-18 MED ORDER — SODIUM CHLORIDE 0.9 % IR SOLN
Status: DC | PRN
  Administered 2021-05-18: 1000 mL

## 2021-05-18 MED ORDER — OXYCODONE HCL 5 MG/5ML PO SOLN: 5.0000 mg | Freq: Once | ORAL | Status: AC | PRN

## 2021-05-18 MED ORDER — OXYCODONE HCL 5 MG PO TABS: 5.0000 mg | ORAL_TABLET | Freq: Four times a day (QID) | ORAL | 0 refills | Status: DC | PRN

## 2021-05-18 MED ORDER — PROPOFOL 10 MG/ML IV BOLUS
INTRAVENOUS | Status: AC
  Filled 2021-05-18: qty 20

## 2021-05-18 MED ORDER — INSULIN ASPART 100 UNIT/ML IJ SOLN
INTRAMUSCULAR | Status: AC
  Filled 2021-05-18: qty 1

## 2021-05-18 MED ORDER — FENTANYL CITRATE (PF) 100 MCG/2ML IJ SOLN
25.0000 ug | INTRAMUSCULAR | Status: DC | PRN
  Administered 2021-05-18 (×2): 50 ug via INTRAVENOUS
  Administered 2021-05-18: 25 ug via INTRAVENOUS

## 2021-05-18 MED ORDER — KETOROLAC TROMETHAMINE 30 MG/ML IJ SOLN
INTRAMUSCULAR | Status: DC | PRN
  Administered 2021-05-18: 15 mg via INTRAVENOUS

## 2021-05-18 MED ORDER — FENTANYL CITRATE (PF) 100 MCG/2ML IJ SOLN
INTRAMUSCULAR | Status: DC
Start: 2021-05-18 — End: 2021-05-18
  Filled 2021-05-18: qty 2

## 2021-05-18 MED ORDER — PROPOFOL 10 MG/ML IV BOLUS
INTRAVENOUS | Status: DC | PRN
Start: 2021-05-18 — End: 2021-05-18
  Administered 2021-05-18: 200 mg via INTRAVENOUS

## 2021-05-18 MED ORDER — OXYCODONE HCL 5 MG PO TABS
ORAL_TABLET | ORAL | Status: AC
  Filled 2021-05-18: qty 1

## 2021-05-18 MED ORDER — CHLORHEXIDINE GLUCONATE 0.12 % MT SOLN
15.0000 mL | Freq: Once | OROMUCOSAL | Status: AC
  Administered 2021-05-18: 15 mL via OROMUCOSAL
  Filled 2021-05-18: qty 15

## 2021-05-18 MED ORDER — ROCURONIUM BROMIDE 10 MG/ML (PF) SYRINGE
PREFILLED_SYRINGE | INTRAVENOUS | Status: DC | PRN
  Administered 2021-05-18: 10 mg via INTRAVENOUS
  Administered 2021-05-18: 60 mg via INTRAVENOUS

## 2021-05-18 MED ORDER — PROPOFOL 500 MG/50ML IV EMUL
INTRAVENOUS | Status: DC | PRN
  Administered 2021-05-18: 20 ug/kg/min via INTRAVENOUS

## 2021-05-18 MED ORDER — KETOROLAC TROMETHAMINE 30 MG/ML IJ SOLN
INTRAMUSCULAR | Status: AC
  Filled 2021-05-18: qty 1

## 2021-05-18 MED ORDER — ONDANSETRON HCL 4 MG/2ML IJ SOLN
INTRAMUSCULAR | Status: DC | PRN
  Administered 2021-05-18: 4 mg via INTRAVENOUS

## 2021-05-18 MED ORDER — ARTIFICIAL TEARS OPHTHALMIC OINT
TOPICAL_OINTMENT | OPHTHALMIC | Status: AC
  Filled 2021-05-18: qty 3.5

## 2021-05-18 MED ORDER — INSULIN ASPART 100 UNIT/ML IJ SOLN
0.0000 [IU] | INTRAMUSCULAR | Status: DC | PRN
  Administered 2021-05-18: 2 [IU] via SUBCUTANEOUS

## 2021-05-18 MED ORDER — BUPIVACAINE-EPINEPHRINE 0.25% -1:200000 IJ SOLN
INTRAMUSCULAR | Status: DC | PRN
  Administered 2021-05-18: 25 mL

## 2021-05-18 MED ORDER — SUGAMMADEX SODIUM 500 MG/5ML IV SOLN
INTRAVENOUS | Status: AC
  Filled 2021-05-18: qty 5

## 2021-05-18 MED ORDER — INDIGOTINDISULFONATE SODIUM 8 MG/ML IJ SOLN
INTRAMUSCULAR | Status: DC | PRN
  Administered 2021-05-18: 5 mg via INTRAVENOUS

## 2021-05-18 MED ORDER — LIDOCAINE 2% (20 MG/ML) 5 ML SYRINGE
INTRAMUSCULAR | Status: DC | PRN
  Administered 2021-05-18: 100 mg via INTRAVENOUS

## 2021-05-18 MED ORDER — ACETAMINOPHEN 500 MG PO TABS
1000.0000 mg | ORAL_TABLET | ORAL | Status: AC
  Administered 2021-05-18: 1000 mg via ORAL
  Filled 2021-05-18: qty 2

## 2021-05-18 MED ORDER — LIDOCAINE 2% (20 MG/ML) 5 ML SYRINGE
INTRAMUSCULAR | Status: AC
  Filled 2021-05-18: qty 5

## 2021-05-18 MED ORDER — DROPERIDOL 2.5 MG/ML IJ SOLN: 0.6250 mg | Freq: Once | INTRAMUSCULAR | Status: DC | PRN

## 2021-05-18 MED ORDER — MIDAZOLAM HCL 2 MG/2ML IJ SOLN
INTRAMUSCULAR | Status: AC
  Filled 2021-05-18: qty 2

## 2021-05-18 MED ORDER — BUPIVACAINE-EPINEPHRINE (PF) 0.25% -1:200000 IJ SOLN
INTRAMUSCULAR | Status: AC
  Filled 2021-05-18: qty 30

## 2021-05-18 MED ORDER — LACTATED RINGERS IV SOLN: INTRAVENOUS | Status: DC

## 2021-05-18 MED ORDER — ONDANSETRON HCL 4 MG/2ML IJ SOLN
INTRAMUSCULAR | Status: AC
  Filled 2021-05-18: qty 2

## 2021-05-18 MED ORDER — SODIUM CHLORIDE 0.9 % IV SOLN
2.0000 g | INTRAVENOUS | Status: AC
  Administered 2021-05-18: 2 g via INTRAVENOUS
  Filled 2021-05-18: qty 2

## 2021-05-18 MED ORDER — IOHEXOL 300 MG/ML  SOLN
INTRAMUSCULAR | Status: DC | PRN
Start: 2021-05-18 — End: 2021-05-18
  Administered 2021-05-18: 8 mL

## 2021-05-18 MED ORDER — FENTANYL CITRATE (PF) 250 MCG/5ML IJ SOLN
INTRAMUSCULAR | Status: DC | PRN
  Administered 2021-05-18 (×2): 25 ug via INTRAVENOUS
  Administered 2021-05-18: 100 ug via INTRAVENOUS

## 2021-05-18 MED ORDER — 0.9 % SODIUM CHLORIDE (POUR BTL) OPTIME
TOPICAL | Status: DC | PRN
  Administered 2021-05-18: 1000 mL

## 2021-05-18 MED ORDER — OXYCODONE HCL 5 MG PO TABS
5.0000 mg | ORAL_TABLET | Freq: Once | ORAL | Status: AC | PRN
  Administered 2021-05-18: 5 mg via ORAL

## 2021-05-18 MED ORDER — ACETAMINOPHEN 500 MG PO TABS: 1000.0000 mg | ORAL_TABLET | Freq: Four times a day (QID) | ORAL | 0 refills | Status: AC | PRN

## 2021-05-18 MED ORDER — DEXAMETHASONE SODIUM PHOSPHATE 10 MG/ML IJ SOLN
INTRAMUSCULAR | Status: DC | PRN
  Administered 2021-05-18: 10 mg via INTRAVENOUS

## 2021-05-18 MED ORDER — SCOPOLAMINE 1 MG/3DAYS TD PT72
1.0000 | MEDICATED_PATCH | Freq: Once | TRANSDERMAL | Status: DC
  Administered 2021-05-18: 1.5 mg via TRANSDERMAL
  Filled 2021-05-18: qty 1

## 2021-05-18 MED ORDER — FENTANYL CITRATE (PF) 250 MCG/5ML IJ SOLN
INTRAMUSCULAR | Status: AC
  Filled 2021-05-18: qty 5

## 2021-05-18 MED ORDER — ROCURONIUM BROMIDE 10 MG/ML (PF) SYRINGE
PREFILLED_SYRINGE | INTRAVENOUS | Status: AC
  Filled 2021-05-18: qty 10

## 2021-05-18 SURGICAL SUPPLY — 50 items
ADH SKN CLS APL DERMABOND .7 (GAUZE/BANDAGES/DRESSINGS) ×1
APL PRP STRL LF DISP 70% ISPRP (MISCELLANEOUS) ×1
APPLIER CLIP 5 13 M/L LIGAMAX5 (MISCELLANEOUS) ×2
APR CLP MED LRG 5 ANG JAW (MISCELLANEOUS) ×1
BAG COUNTER SPONGE SURGICOUNT (BAG) ×3 IMPLANT
BAG SPEC RTRVL 10 TROC 200 (ENDOMECHANICALS) ×1
BAG SPNG CNTER NS LX DISP (BAG) ×1
BLADE CLIPPER SURG (BLADE) IMPLANT
CANISTER SUCT 3000ML PPV (MISCELLANEOUS) ×3 IMPLANT
CHLORAPREP W/TINT 26 (MISCELLANEOUS) ×3 IMPLANT
CLIP APPLIE 5 13 M/L LIGAMAX5 (MISCELLANEOUS) ×2 IMPLANT
COVER MAYO STAND STRL (DRAPES) ×3 IMPLANT
COVER SURGICAL LIGHT HANDLE (MISCELLANEOUS) ×3 IMPLANT
DERMABOND ADVANCED (GAUZE/BANDAGES/DRESSINGS) ×1
DERMABOND ADVANCED .7 DNX12 (GAUZE/BANDAGES/DRESSINGS) IMPLANT
DRAPE C-ARM 42X120 X-RAY (DRAPES) ×3 IMPLANT
DRSG TEGADERM 2-3/8X2-3/4 SM (GAUZE/BANDAGES/DRESSINGS) ×9 IMPLANT
DRSG TEGADERM 4X4.75 (GAUZE/BANDAGES/DRESSINGS) ×3 IMPLANT
ELECT REM PT RETURN 9FT ADLT (ELECTROSURGICAL) ×2
ELECTRODE REM PT RTRN 9FT ADLT (ELECTROSURGICAL) ×2 IMPLANT
GAUZE SPONGE 2X2 8PLY STRL LF (GAUZE/BANDAGES/DRESSINGS) ×2 IMPLANT
GLOVE BIOGEL M STRL SZ7.5 (GLOVE) ×3 IMPLANT
GLOVE INDICATOR 8.0 STRL GRN (GLOVE) ×6 IMPLANT
GOWN STRL REUS W/ TWL LRG LVL3 (GOWN DISPOSABLE) ×6 IMPLANT
GOWN STRL REUS W/TWL 2XL LVL3 (GOWN DISPOSABLE) ×3 IMPLANT
GOWN STRL REUS W/TWL LRG LVL3 (GOWN DISPOSABLE) ×6
GRASPER SUT TROCAR 14GX15 (MISCELLANEOUS) ×3 IMPLANT
KIT BASIN OR (CUSTOM PROCEDURE TRAY) ×3 IMPLANT
KIT TURNOVER KIT B (KITS) ×3 IMPLANT
NS IRRIG 1000ML POUR BTL (IV SOLUTION) ×3 IMPLANT
PAD ARMBOARD 7.5X6 YLW CONV (MISCELLANEOUS) ×3 IMPLANT
POUCH RETRIEVAL ECOSAC 10 (ENDOMECHANICALS) ×2 IMPLANT
POUCH RETRIEVAL ECOSAC 10MM (ENDOMECHANICALS) ×2
SCISSORS LAP 5X35 DISP (ENDOMECHANICALS) ×3 IMPLANT
SET CHOLANGIOGRAPH 5 50 .035 (SET/KITS/TRAYS/PACK) ×3 IMPLANT
SET IRRIG TUBING LAPAROSCOPIC (IRRIGATION / IRRIGATOR) ×3 IMPLANT
SET TUBE SMOKE EVAC HIGH FLOW (TUBING) ×3 IMPLANT
SLEEVE ENDOPATH XCEL 5M (ENDOMECHANICALS) ×6 IMPLANT
SPECIMEN JAR SMALL (MISCELLANEOUS) ×3 IMPLANT
SPONGE GAUZE 2X2 STER 10/PKG (GAUZE/BANDAGES/DRESSINGS) ×1
STRIP CLOSURE SKIN 1/2X4 (GAUZE/BANDAGES/DRESSINGS) ×3 IMPLANT
SUT MNCRL AB 4-0 PS2 18 (SUTURE) ×3 IMPLANT
SUT VIC AB 0 UR5 27 (SUTURE) ×3 IMPLANT
SUT VICRYL 0 UR6 27IN ABS (SUTURE) IMPLANT
TOWEL GREEN STERILE (TOWEL DISPOSABLE) ×3 IMPLANT
TOWEL GREEN STERILE FF (TOWEL DISPOSABLE) ×3 IMPLANT
TRAY LAPAROSCOPIC MC (CUSTOM PROCEDURE TRAY) ×3 IMPLANT
TROCAR XCEL BLUNT TIP 100MML (ENDOMECHANICALS) ×3 IMPLANT
TROCAR XCEL NON-BLD 5MMX100MML (ENDOMECHANICALS) ×3 IMPLANT
WATER STERILE IRR 1000ML POUR (IV SOLUTION) ×3 IMPLANT

## 2021-05-18 NOTE — Anesthesia Procedure Notes (Signed)
Procedure Name: Intubation ?Date/Time: 05/18/2021 1:31 PM ?Performed by: Maude Leriche, CRNA ?Pre-anesthesia Checklist: Patient identified, Emergency Drugs available, Suction available and Patient being monitored ?Patient Re-evaluated:Patient Re-evaluated prior to induction ?Oxygen Delivery Method: Circle system utilized ?Preoxygenation: Pre-oxygenation with 100% oxygen ?Induction Type: IV induction ?Ventilation: Mask ventilation without difficulty ?Laryngoscope Size: Sabra Heck and 2 ?Grade View: Grade II ?Tube type: Oral ?Tube size: 7.0 mm ?Number of attempts: 1 ?Airway Equipment and Method: Stylet and Bite block ?Placement Confirmation: ETT inserted through vocal cords under direct vision, positive ETCO2 and breath sounds checked- equal and bilateral ?Secured at: 21 cm ?Tube secured with: Tape ?Dental Injury: Teeth and Oropharynx as per pre-operative assessment  ? ? ? ? ?

## 2021-05-18 NOTE — Op Note (Signed)
Kim Delgado ?[id number] ?12-24-1975 ?05/18/2021 ? ?Laparoscopic Cholecystectomy with IOC and ICG dye immunofluorescence procedure Note ? ?Indications: This patient presents with symptomatic gallbladder disease and will undergo laparoscopic cholecystectomy. ? ?Pre-operative Diagnosis: symptomatic cholelithiasis ? 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 ?Surgeon: Vandenberghe Pickerel MD FACS ? ?Assistants: none ? ?Anesthesia: General endotracheal anesthesia ? ? ?Procedure Details  ?The patient was seen again in the Holding Room. The risks, benefits, complications, treatment options, and expected outcomes were discussed with the patient. The possibilities of reaction to medication, pulmonary aspiration, perforation of viscus, bleeding, recurrent infection, finding a normal gallbladder, the need for additional procedures, failure to diagnose a condition, the possible need to convert to an open procedure, and creating a complication requiring transfusion or operation were discussed with the patient. The likelihood of improving the patient's symptoms with return to their baseline status is good.  The patient and/or family concurred with the proposed plan, giving informed consent. The site of surgery properly noted. The patient was taken to Operating Room, identified as Kim Delgado and the procedure verified as Laparoscopic Cholecystectomy with Intraoperative Cholangiogram. A Time Out was held and the above information confirmed. Antibiotic prophylaxis was administered.  ?Patient received ICG dye in the holding area. ? ?Prior to the induction of general anesthesia, antibiotic prophylaxis was administered. General endotracheal anesthesia was then administered and tolerated well. After the induction, the abdomen was prepped with Chloraprep and draped in the sterile fashion. The patient was positioned in the supine position. ? ?Local anesthetic agent was injected into the skin near the umbilicus and an  incision made. We dissected down to the abdominal fascia with blunt dissection.  The fascia was incised vertically and we entered the peritoneal cavity bluntly.  A pursestring suture of 0-Vicryl was placed around the fascial opening.  The Hasson cannula was inserted and secured with the stay suture.  Pneumoperitoneum was then created with CO2 and tolerated well without any adverse changes in the patient's vital signs. An 5-mm port was placed in the subxiphoid position.  Two 5-mm ports were placed in the right upper quadrant. All skin incisions were infiltrated with a local anesthetic agent before making the incision and placing the trocars.  ? ?We positioned the patient in reverse Trendelenburg, tilted slightly to the patient's left.  The gallbladder was identified, the fundus grasped and retracted cephalad. Adhesions were lysed bluntly and with the electrocautery where indicated, taking care not to injure any adjacent organs or viscus. The infundibulum was grasped and retracted laterally, exposing the peritoneum overlying the triangle of Calot. This was then divided and exposed in a blunt fashion. A critical view of the cystic duct and cystic artery was obtained.  The cystic duct was clearly identified and bluntly dissected circumferentially.  Near infrared immunofluorescence was activated.  There was immunofluorescent activity seen within the gallbladder, cystic duct and common bile duct.  I could see immunofluorescence activity within the small bowel.  The cystic duct was ligated with a clip distally.   An incision was made in the cystic duct and the [facility] cholangiogram catheter introduced. The catheter was secured using a clip. A cholangiogram was then obtained which showed good visualization of the distal and proximal biliary tree with no sign of filling defects or obstruction.  Contrast flowed easily into the duodenum. The catheter was then removed.  ? ?The cystic duct was then ligated with clips and divided.  The cystic artery which had been identified & dissected free was ligated with  clips and divided as well.  ? ?The gallbladder was dissected from the liver bed in retrograde fashion with the electrocautery.  Some spillage of bile from the gallbladder but no stone spillage.  The gallbladder was removed and placed in an Ecco sac.  The gallbladder and Ecco sac were then removed through the umbilical port site. The liver bed was irrigated and inspected. Hemostasis was achieved with the electrocautery. Copious irrigation was utilized and was repeatedly aspirated until clear.  The patient had omentum stuck around her umbilical area from prior surgery.  Some of this was taken down with hook electrocautery with laparoscopic guidance.  There was no bowel stuck to the anterior abdominal wall.  The pursestring suture was used to close the umbilical fascia.  An additional 0 Vicryl was placed at the supraumbilical fascial incision with a PMI using 0 Vicryl with laparoscopic guidance. ? ?We again inspected the right upper quadrant for hemostasis.  The umbilical closure was inspected and there was no air leak and nothing trapped within the closure. Pneumoperitoneum was released as we removed the trocars.  4-0 Monocryl was used to close the skin.  Dermabond was applied. The patient was then extubated and brought to the recovery room in stable condition. Instrument, sponge, and needle counts were correct at closure and at the conclusion of the case.  ? ?Findings: ?Chronic Cholecystitis with Cholelithiasis ? ?Estimated Blood Loss: Minimal ?        ?Drains: none ?        ?Specimens: Gallbladder     ?      ?Complications: None; patient tolerated the procedure well. ?        ?Disposition: PACU - hemodynamically stable. ?        ?Condition: stable ? ?Kim Delgado. Redmond Pulling, MD, FACS ?General, Bariatric, & Minimally Invasive Surgery ?[facility], [place] ? ?

## 2021-05-18 NOTE — Transfer of Care (Signed)
Immediate Anesthesia Transfer of Care Note ? ?Patient: Kim Delgado ? ?Procedure(s) Performed: LAPAROSCOPIC CHOLECYSTECTOMY WITH INTRAOPERATIVE CHOLANGIOGRAM AND ICG DYE ?INDOCYANINE GREEN FLUORESCENCE IMAGING (ICG) ? ?Patient Location: PACU ? ?Anesthesia Type:General ? ?Level of Consciousness: drowsy and patient cooperative ? ?Airway & Oxygen Therapy: Patient Spontanous Breathing and Patient connected to nasal cannula oxygen ? ?Post-op Assessment: Report given to RN, Post -op Vital signs reviewed and stable and Patient moving all extremities ? ?Post vital signs: Reviewed and stable ? ?Last Vitals:  ?Vitals Value Taken Time  ?BP 156/93 05/18/21 1457  ?Temp 36.7 ?C 05/18/21 1457  ?Pulse 90 05/18/21 1457  ?Resp 24 05/18/21 1457  ?SpO2 95 % 05/18/21 1457  ?Vitals shown include unvalidated device data. ? ?Last Pain:  ?Vitals:  ? 05/18/21 1018  ?TempSrc: Oral  ?   ? ?  ? ?Complications: No notable events documented. ?

## 2021-05-18 NOTE — Anesthesia Preprocedure Evaluation (Addendum)
Anesthesia Evaluation  ?Patient identified by MRN, date of birth, ID band ?Patient awake ? ? ? ?Reviewed: ?Allergy & Precautions, NPO status , Patient's Chart, lab work & pertinent test results ? ?History of Anesthesia Complications ?Negative for: history of anesthetic complications ? ?Airway ?Mallampati: II ? ?TM Distance: >3 FB ?Neck ROM: Full ? ? ? Dental ?no notable dental hx. ? ?  ?Pulmonary ?former smoker,  ?  ?Pulmonary exam normal ? ? ? ? ? ? ? Cardiovascular ?negative cardio ROS ?Normal cardiovascular exam ? ? ?  ?Neuro/Psych ?Depression negative neurological ROS ?   ? GI/Hepatic ?Neg liver ROS, SYMPTOMATIC CHOLELITHIASIS ?  ?Endo/Other  ?diabetes, Type 2, Oral Hypoglycemic AgentsHypothyroidism Morbid obesity ? Renal/GU ?negative Renal ROS  ?negative genitourinary ?  ?Musculoskeletal ?negative musculoskeletal ROS ?(+)  ? Abdominal ?  ?Peds ? Hematology ?negative hematology ROS ?(+)   ?Anesthesia Other Findings ?Day of surgery medications reviewed with patient. ? Reproductive/Obstetrics ?negative OB ROS ? ?  ? ? ? ? ? ? ? ? ? ? ? ? ? ?  ?  ? ? ? ? ? ? ? ?Anesthesia Physical ?Anesthesia Plan ? ?ASA: 3 ? ?Anesthesia Plan: General  ? ?Post-op Pain Management: Tylenol PO (pre-op)*  ? ?Induction: Intravenous ? ?PONV Risk Score and Plan: 4 or greater and Treatment may vary due to age or medical condition, Ondansetron, Dexamethasone, Midazolam, Scopolamine patch - Pre-op and Propofol infusion ? ?Airway Management Planned: Oral ETT ? ?Additional Equipment: None ? ?Intra-op Plan:  ? ?Post-operative Plan: Extubation in OR ? ?Informed Consent: I have reviewed the patients History and Physical, chart, labs and discussed the procedure including the risks, benefits and alternatives for the proposed anesthesia with the patient or authorized representative who has indicated his/her understanding and acceptance.  ? ? ? ?Dental advisory given ? ?Plan Discussed with: CRNA ? ?Anesthesia Plan  Comments:   ? ? ? ? ? ?Anesthesia Quick Evaluation ? ?

## 2021-05-18 NOTE — Interval H&P Note (Signed)
History and Physical Interval Note: ? ?05/18/2021 ?1:10 PM ? ?Kim Delgado  has presented today for surgery, with the diagnosis of SYMPTOMATIC CHOLELITHIASIS.  The various methods of treatment have been discussed with the patient and family. After consideration of risks, benefits and other options for treatment, the patient has consented to  Procedure(s): ?LAPAROSCOPIC CHOLECYSTECTOMY WITH INTRAOPERATIVE CHOLANGIOGRAM AND ICG DYE (N/A) ?INDOCYANINE GREEN FLUORESCENCE IMAGING (ICG) (N/A) as a surgical intervention.  The patient's history has been reviewed, patient examined, no change in status, stable for surgery.  I have reviewed the patient's chart and labs.  Questions were answered to the patient's satisfaction.   ? ? ?Kim Delgado ? ? ?

## 2021-05-18 NOTE — H&P (Signed)
REFERRING PHYSICIAN: Nche, Charlene Brooke, NP ? ?PROVIDER: Tyne Banta Leanne Chang, MD ? ?MRN: [id number] ?DOB: [birth year] ?DATE OF ENCOUNTER: 05/07/2021 ? ?Subjective  ? ?Chief Complaint: Cholelithiasis ? ? ?History of Present Illness: ?Kim Delgado is a 46 y.o. female who is seen today as an office consultation at the request of Dr. Lorayne Marek for evaluation of Cholelithiasis ?.  ? ?The patient states that she started having some issues late in the fall. She would have some lower back pain on her right side and initially thought she was having may be a kidney stone. But on New Year's Eve she had to sip some on and she had sensations that her leg was on fire, she also had right upper quadrant pain radiating to her side and back. It persisted and she ended up going to urgent care and she was found to have an elevated lipase. They thought it might be medication related so they stopped her Trulicity. Since then she has had almost daily discomfort on the right side underneath her rib cage rating to her side. At times it will be increased in intensity. She has been on tramadol, naproxen and muscle relaxants which helped but she is always having sort of daily discomfort. She has had a persistent elevated lipase which is described below. She just saw GI yesterday in follow-up. She has had imaging which demonstrates cholelithiasis. ? ? ?Review of Systems: ?A complete review of systems was obtained from the patient. I have reviewed this information and discussed as appropriate with the patient. See HPI as well for other ROS. ? ?ROS  ? ?Medical History: ?Past Medical History:  ?Diagnosis Date  ? Anemia  ? Diabetes mellitus without complication (CMS-HCC)  ? Thyroid disease  ? ?There is no problem list on file for this patient. ? ?Past Surgical History:  ?Procedure Laterality Date  ? HYSTERECTOMY  ? LAPAROSCOPIC TUBAL LIGATION  ? ? ?No Known Allergies ? ?Current Outpatient Medications on File Prior to Visit  ?Medication Sig Dispense Refill  ?  glipiZIDE (GLUCOTROL) 5 MG tablet TAKE 1 TABLET BY MOUTH TWICE DAILY WITH BREAKFAST AND WITH LUNCH  ? herbal drugs Cap Take by mouth  ? levothyroxine (SYNTHROID) 150 MCG tablet Take by mouth  ? metFORMIN (GLUCOPHAGE-XR) 500 MG XR tablet Take by mouth  ? multivitamin (MULTIPLE VITAMINS ORAL) Take by mouth  ? naproxen (NAPROSYN) 500 MG tablet Take 500 mg by mouth 2 (two) times daily  ? NON FORMULARY  ? tiZANidine (ZANAFLEX) 4 MG tablet Take by mouth  ? ?No current facility-administered medications on file prior to visit.  ? ?Family History  ?Problem Relation Age of Onset  ? Stroke Mother  ? Obesity Mother  ? High blood pressure (Hypertension) Mother  ? Diabetes Mother  ? ? ?Social History  ? ?Tobacco Use  ?Smoking Status Never  ?Smokeless Tobacco Never  ? ? ?Social History  ? ?Socioeconomic History  ? Marital status: Married  ?Tobacco Use  ? Smoking status: Never  ? Smokeless tobacco: Never  ?Substance and Sexual Activity  ? Alcohol use: Never  ? Drug use: Never  ? ?Objective:  ? ?Vitals:  ?05/07/21 1358  ?BP: (!) 140/82  ?Pulse: (!) 117  ?Weight: (!) 119.7 kg (264 lb)  ?Height: 172.7 cm ('5\' 8"'$ )  ? ?Body mass index is 40.14 kg/m?. ? ?Constitutional: NAD; conversant; no deformities; obesity ?Eyes: Moist conjunctiva; no lid lag; anicteric; PERRL ?Neck: Trachea midline; no thyromegaly ?Lungs: Normal respiratory effort; no tactile fremitus ?CV: RRR; no palpable thrills;  no pitting edema ?GI: Abd old scar at umbilicus, old Pfannenstiel, soft, nontender, nondistended; no palpable hepatosplenomegaly ?MSK: Normal gait; no clubbing/cyanosis ?Psychiatric: Appropriate affect; alert and oriented x3 ?Lymphatic: No palpable cervical or axillary lymphadenopathy ?Skin: No rash, lesions or jaundice ? ?Labs, Imaging and Diagnostic Testing: ?NP Nche note 3/29, 1/6 ?Lipase 65 followed by 145 on March 23 ?Normal LFTs in January, normal CBC January ?Abdominal ultrasound March 31 showed cholelithiasis with small amount of sludge. No biliary  dilatation ?CT abdomen pelvis January 10, 2021 no acute findings, mild hepatomegaly a with diffuse hepatic steatosis ?Colonoscopy 1 polyp in the transverse colon which was removed, one polyp in the descending colon which was removed, diverticulosis and internal hemorrhoids ?GI note from yesterday ?Assessment and Plan:  ? ? ?Diagnoses and all orders for this visit: ? ?Symptomatic cholelithiasis ? ?Fatty liver ? ?Elevated lipase ? ? ? ?I believe the patient's symptoms are consistent with gallbladder disease. ? ?We discussed gallbladder disease. The patient was given Neurosurgeon. We discussed non-operative and operative management. We discussed the signs & symptoms of acute cholecystitis ? ?I discussed laparoscopic cholecystectomy with possible IOC in detail. The patient was given educational material as well as diagrams detailing the procedure. We discussed the risks and benefits of a laparoscopic cholecystectomy including, but not limited to bleeding, infection, injury to surrounding structures such as the intestine or liver, bile leak, retained gallstones, need to convert to an open procedure, prolonged diarrhea, blood clots such as DVT, common bile duct injury, anesthesia risks, and possible need for additional procedures. We discussed the typical post-operative recovery course. I explained that the likelihood of improvement of their symptoms is good. ? ?Did tell her that cholecystectomy would not ameliorate the intermittent occasional sensation of her legs being on fire which is apparently a intermittent chronic issue. ? ?We did discuss preoperative imaging with MRI MRCP. We discussed the pros and cons of this. She had no ductal dilatation and her other LFTs are normal so I think proceeding with cholecystectomy with intraoperative cholangiogram would be safe. We did discuss the possibility that her elevated lipase could be unrelated to her gallbladder. We did discuss that if for some reason she is found to  have choledocholithiasis that that would require a postoperative ERCP. ? ?This patient encounter took 45 minutes today to perform the following: take history, perform exam, review outside records, interpret imaging, counsel the patient on their diagnosis and document encounter, findings & plan in the EHR ? ?No follow-ups on file. ? ?Dimitrius Steedman Leanne Chang, MD  ?General, Minimally Invasive, & Bariatric Surgery ? ?

## 2021-05-18 NOTE — Discharge Instructions (Signed)
Kim Delgado, P.A. ?LAPAROSCOPIC SURGERY: POST OP INSTRUCTIONS ?Always review your discharge instruction sheet given to you by the facility where your surgery was performed. ?IF YOU HAVE DISABILITY OR FAMILY LEAVE FORMS, YOU MUST BRING THEM TO THE OFFICE FOR PROCESSING.   ?DO NOT GIVE THEM TO YOUR DOCTOR. ? ?PAIN CONTROL ? ?First take acetaminophen (Tylenol) AND/or ibuprofen (Advil) to control your pain after surgery.  Follow directions on package.  Taking acetaminophen (Tylenol) and/or ibuprofen (Advil) regularly after surgery will help to control your pain and lower the amount of prescription pain medication you may need.  You should not take more than 3,000 mg (3 grams) of acetaminophen (Tylenol) in 24 hours.  You should not take ibuprofen (Advil), aleve, motrin, naprosyn or other NSAIDS if you have a history of stomach ulcers or chronic kidney disease.  ?A prescription for pain medication may be given to you upon discharge.  Take your pain medication as prescribed, if you still have uncontrolled pain after taking acetaminophen (Tylenol) or ibuprofen (Advil). ?Use ice packs to help control pain. ?If you need a refill on your pain medication, please contact your pharmacy.  They will contact our office to request authorization. Prescriptions will not be filled after 5pm or on week-ends. ? ?HOME MEDICATIONS ?Take your usually prescribed medications unless otherwise directed. ? ?DIET ?You should follow a light diet the first few days after arrival home.  Be sure to include lots of fluids daily. Avoid fatty, fried foods.  ? ?CONSTIPATION ?It is common to experience some constipation after surgery and if you are taking pain medication.  Increasing fluid intake and taking a stool softener (such as Colace) will usually help or prevent this problem from occurring.  A mild laxative (Milk of Magnesia or Miralax) should be taken according to package instructions if there are no bowel movements after 48  hours. ? ?WOUND/INCISION CARE ?Most patients will experience some swelling and bruising in the area of the incisions.  Ice packs will help.  Swelling and bruising can take several days to resolve.  ?Unless discharge instructions indicate otherwise, follow guidelines below  ?STERI-STRIPS - you may remove your outer bandages 48 hours after surgery, and you may shower at that time.  You have steri-strips (small skin tapes) in place directly over the incision.  These strips should be left on the skin for 7-10 days.   ?DERMABOND/SKIN GLUE - you may shower in 24 hours.  The glue will flake off over the next 2-3 weeks. ?Any sutures or staples will be removed at the office during your follow-up visit. ? ?ACTIVITIES ?You may resume regular (light) daily activities beginning the next day--such as daily self-care, walking, climbing stairs--gradually increasing activities as tolerated.  You may have sexual intercourse when it is comfortable.  Refrain from any heavy lifting or straining until approved by your doctor. ?You may drive when you are no longer taking prescription pain medication, you can comfortably wear a seatbelt, and you can safely maneuver your car and apply brakes. ? ?FOLLOW-UP ?You should see your doctor in the office for a follow-up appointment approximately 2-3 weeks after your surgery.  You should have been given your post-op/follow-up appointment when your surgery was scheduled.  If you did not receive a post-op/follow-up appointment, make sure that you call for this appointment within a day or two after you arrive home to insure a convenient appointment time. ? ?OTHER INSTRUCTIONS ? ? ?WHEN TO CALL YOUR DOCTOR: ?Fever over 101.0 ?Inability to urinate ?Continued  bleeding from incision. ?Increased pain, redness, or drainage from the incision. ?Increasing abdominal pain ? ?The clinic staff is available to answer your questions during regular business hours.  Please don?t hesitate to call and ask to speak to  one of the nurses for clinical concerns.  If you have a medical emergency, go to the nearest emergency room or call 911.  A surgeon from [facility] is always on call at the hospital. ?[address], [place], [place], [place]  [address] ? [address], [place], [place]   [address] ?[phone] ? [phone] ? FAX [phone] ?Web site: [email or web address] ? ? ? ? ?Managing Your Pain After Surgery Without Opioids ? ? ? ?Thank you for participating in our program to help patients manage their pain after surgery without opioids. This is part of our effort to provide you with the best care possible, without exposing you or your family to the risk that opioids pose. ? ?What pain can I expect after surgery? ?You can expect to have some pain after surgery. This is normal. The pain is typically worse the day after surgery, and quickly begins to get better. ?Many studies have found that many patients are able to manage their pain after surgery with Over-the-Counter (OTC) medications such as Tylenol and Motrin. If you have a condition that does not allow you to take Tylenol or Motrin, notify your surgical team. ? ?How will I manage my pain? ?The best strategy for controlling your pain after surgery is around the clock pain control with Tylenol (acetaminophen) and Motrin (ibuprofen or Advil). Alternating these medications with each other allows you to maximize your pain control. In addition to Tylenol and Motrin, you can use heating pads or ice packs on your incisions to help reduce your pain. ? ?How will I alternate your regular strength over-the-counter pain medication? ?You will take a dose of pain medication every three hours. ?Start by taking 650 mg of Tylenol (2 pills of 325 mg) ?3 hours later take 600 mg of Motrin (3 pills of 200 mg) ?3 hours after taking the Motrin take 650 mg of Tylenol ?3 hours after that take 600 mg of Motrin. ? ? ?- 1 - ? ?See example - if your first dose of  Tylenol is at 12:00 PM ? ? ?12:00 PM Tylenol 650 mg (2 pills of 325 mg)  ?3:00 PM Motrin 600 mg (3 pills of 200 mg)  ?6:00 PM Tylenol 650 mg (2 pills of 325 mg)  ?9:00 PM Motrin 600 mg (3 pills of 200 mg)  ?Continue alternating every 3 hours  ? ?We recommend that you follow this schedule around-the-clock for at least 3 days after surgery, or until you feel that it is no longer needed. Use the table on the last page of this handout to keep track of the medications you are taking. ?Important: ?Do not take more than '3000mg'$  of Tylenol or '1600mg'$  of Motrin in a 24-hour period. ?Do not take ibuprofen/Motrin if you have a history of bleeding stomach ulcers, severe kidney disease, &/or actively taking a blood thinner ? ?What if I still have pain? ?If you have pain that is not controlled with the over-the-counter pain medications (Tylenol and Motrin or Advil) you might have what we call ?breakthrough? pain. You will receive a prescription for a small amount of an opioid pain medication such as Oxycodone, Tramadol, or Tylenol with Codeine. Use these opioid pills in the first 24 hours after surgery if you have breakthrough  pain. Do not take more than 1 pill every 4-6 hours. ? ?If you still have uncontrolled pain after using all opioid pills, don't hesitate to call our staff using the number provided. We will help make sure you are managing your pain in the best way possible, and if necessary, we can provide a prescription for additional pain medication. ? ? ?Day 1   ? ?Time  ?Name of Medication Number of pills taken  ?Amount of Acetaminophen  ?Pain Level  ? ?Comments  ?AM PM       ?AM PM       ?AM PM       ?AM PM       ?AM PM       ?AM PM       ?AM PM       ?AM PM       ?Total Daily amount of Acetaminophen ?Do not take more than  3,000 mg per day    ? ? ?Day 2   ? ?Time  ?Name of Medication Number of pills ?taken  ?Amount of Acetaminophen  ?Pain Level  ? ?Comments  ?AM PM       ?AM PM       ?AM PM       ?AM PM       ?AM PM        ?AM PM       ?AM PM       ?AM PM       ?Total Daily amount of Acetaminophen ?Do not take more than  3,000 mg per day    ? ? ?Day 3   ? ?Time  ?Name of Medication Number of pills taken  ?Amount of Acetaminophen

## 2021-05-18 NOTE — Anesthesia Postprocedure Evaluation (Signed)
Anesthesia Post Note ? ?Patient: SHERAL PFAHLER ? ?Procedure(s) Performed: LAPAROSCOPIC CHOLECYSTECTOMY WITH INTRAOPERATIVE CHOLANGIOGRAM AND ICG DYE ?INDOCYANINE GREEN FLUORESCENCE IMAGING (ICG) ? ?  ? ?Patient location during evaluation: PACU ?Anesthesia Type: General ?Level of consciousness: awake and alert ?Pain management: pain level controlled ?Vital Signs Assessment: post-procedure vital signs reviewed and stable ?Respiratory status: spontaneous breathing, nonlabored ventilation and respiratory function stable ?Cardiovascular status: blood pressure returned to baseline ?Postop Assessment: no apparent nausea or vomiting ?Anesthetic complications: no ? ? ?No notable events documented. ? ?Last Vitals:  ?Vitals:  ? 05/18/21 1530 05/18/21 1545  ?BP: 90/73 133/74  ?Pulse: 77 68  ?Resp: 14 15  ?Temp:    ?SpO2: 100% 96%  ?  ?Last Pain:  ?Vitals:  ? 05/18/21 1545  ?TempSrc:   ?PainSc: 4   ? ? ?  ?  ?  ?  ?  ?  ? ?Marthenia Rolling ? ? ? ? ?

## 2021-05-19 ENCOUNTER — Encounter (HOSPITAL_COMMUNITY): Payer: Self-pay | Admitting: General Surgery

## 2021-05-19 LAB — SURGICAL PATHOLOGY

## 2021-05-25 ENCOUNTER — Encounter: Payer: BC Managed Care – PPO | Admitting: Internal Medicine

## 2021-06-19 LAB — HEMOGLOBIN A1C: Hemoglobin A1C: 6.4

## 2021-06-26 ENCOUNTER — Ambulatory Visit: Payer: BC Managed Care – PPO | Admitting: Nurse Practitioner

## 2021-06-26 ENCOUNTER — Encounter: Payer: Self-pay | Admitting: Nurse Practitioner

## 2021-06-26 VITALS — BP 126/76 | HR 77 | Temp 97.3°F | Ht 68.0 in | Wt 269.8 lb

## 2021-06-26 DIAGNOSIS — E785 Hyperlipidemia, unspecified: Secondary | ICD-10-CM | POA: Diagnosis not present

## 2021-06-26 DIAGNOSIS — R748 Abnormal levels of other serum enzymes: Secondary | ICD-10-CM

## 2021-06-26 DIAGNOSIS — E1169 Type 2 diabetes mellitus with other specified complication: Secondary | ICD-10-CM | POA: Diagnosis not present

## 2021-06-26 DIAGNOSIS — F321 Major depressive disorder, single episode, moderate: Secondary | ICD-10-CM

## 2021-06-26 DIAGNOSIS — Z9049 Acquired absence of other specified parts of digestive tract: Secondary | ICD-10-CM | POA: Insufficient documentation

## 2021-06-26 DIAGNOSIS — E1165 Type 2 diabetes mellitus with hyperglycemia: Secondary | ICD-10-CM | POA: Diagnosis not present

## 2021-06-26 DIAGNOSIS — E039 Hypothyroidism, unspecified: Secondary | ICD-10-CM

## 2021-06-26 LAB — LIPID PANEL
Cholesterol: 138 mg/dL (ref 0–200)
HDL: 36.3 mg/dL — ABNORMAL LOW (ref >39.00)
LDL Cholesterol: 75 mg/dL (ref 0–99)
NonHDL: 101.88
Total CHOL/HDL Ratio: 4
Triglycerides: 136 mg/dL (ref 0.0–149.0)
VLDL: 27.2 mg/dL (ref 0.0–40.0)

## 2021-06-26 LAB — MICROALBUMIN / CREATININE URINE RATIO
Creatinine,U: 47.8 mg/dL
Microalb Creat Ratio: 2.3 mg/g (ref 0.0–30.0)
Microalb, Ur: 1.1 mg/dL (ref 0.0–1.9)

## 2021-06-26 LAB — LIPASE: Lipase: 60 U/L — ABNORMAL HIGH (ref 11.0–59.0)

## 2021-06-26 LAB — AMYLASE: Amylase: 79 U/L (ref 27–131)

## 2021-06-26 MED ORDER — EUTHYROX 150 MCG PO TABS: 150.0000 ug | ORAL_TABLET | Freq: Every day | ORAL | 1 refills | Status: DC

## 2021-06-26 MED ORDER — GLIPIZIDE 5 MG PO TABS: 5.0000 mg | ORAL_TABLET | Freq: Two times a day (BID) | ORAL | 1 refills | Status: DC

## 2021-06-26 MED ORDER — METFORMIN HCL ER 500 MG PO TB24: 1000.0000 mg | ORAL_TABLET | Freq: Two times a day (BID) | ORAL | 1 refills | Status: DC

## 2021-06-26 NOTE — Assessment & Plan Note (Addendum)
S/p cholescystectomy 05/2021 Lipase level: 65, 145, 73, 219 Normal CMP ABD [place]: fatty liver Improved and mild RUQ pain Had post op appt with surgeon 06/25/2021.  Repeat amylase and lipase today

## 2021-06-26 NOTE — Assessment & Plan Note (Addendum)
Hold GLP-1 at this time due to recent cholecystectomy and persistent elevated lipase. Admits to poor diet-stress eating and dislike for vegatables and lack of exercise She declined referral to nutritionist and psychologist.  Advised about the importance of lifestyle modifications. She verbalizes understanding of possible complications from obesity and necessary lifestyle modifications

## 2021-06-26 NOTE — Assessment & Plan Note (Signed)
TSH and T4 checked by employer lab on 06/19/2021: WNL Maintain euthyrox dose Repeat in 6months

## 2021-06-27 ENCOUNTER — Encounter: Payer: Self-pay | Admitting: Nurse Practitioner

## 2021-07-31 ENCOUNTER — Telehealth: Payer: Self-pay | Admitting: Nurse Practitioner

## 2021-07-31 NOTE — Telephone Encounter (Signed)
Pt is wanting a script for a [employer]. [address], [place], [place] [address]  Phone:  [phone]  Fax:  [phone]  Pt's # [phone]

## 2021-08-04 NOTE — Telephone Encounter (Signed)
Pt would like to have this sensor to check her blood sugars each day. She is not willing to stick her fingers and she feels like this would be a better option for her. If this wouldn't be okay with you, she says she is okay with just getting her A1C checked at her visits each time but she is not okay with pricking her fingers

## 2021-08-04 NOTE — Telephone Encounter (Signed)
Pt advised.

## 2021-08-04 NOTE — Telephone Encounter (Signed)
LVM, adv pt to call back

## 2021-09-17 DIAGNOSIS — K1379 Other lesions of oral mucosa: Secondary | ICD-10-CM | POA: Diagnosis not present

## 2021-09-17 DIAGNOSIS — H6122 Impacted cerumen, left ear: Secondary | ICD-10-CM | POA: Diagnosis not present

## 2021-09-17 DIAGNOSIS — H6982 Other specified disorders of Eustachian tube, left ear: Secondary | ICD-10-CM | POA: Diagnosis not present

## 2021-09-17 DIAGNOSIS — H60333 Swimmer's ear, bilateral: Secondary | ICD-10-CM | POA: Diagnosis not present

## 2021-09-28 ENCOUNTER — Encounter: Payer: Self-pay | Admitting: Nurse Practitioner

## 2021-09-28 ENCOUNTER — Ambulatory Visit (INDEPENDENT_AMBULATORY_CARE_PROVIDER_SITE_OTHER): Payer: BC Managed Care – PPO | Admitting: Nurse Practitioner

## 2021-09-28 VITALS — BP 128/80 | HR 83 | Temp 97.3°F | Ht 68.0 in | Wt 265.4 lb

## 2021-09-28 DIAGNOSIS — E039 Hypothyroidism, unspecified: Secondary | ICD-10-CM | POA: Diagnosis not present

## 2021-09-28 DIAGNOSIS — E1169 Type 2 diabetes mellitus with other specified complication: Secondary | ICD-10-CM | POA: Diagnosis not present

## 2021-09-28 DIAGNOSIS — Z1231 Encounter for screening mammogram for malignant neoplasm of breast: Secondary | ICD-10-CM | POA: Diagnosis not present

## 2021-09-28 DIAGNOSIS — E1165 Type 2 diabetes mellitus with hyperglycemia: Secondary | ICD-10-CM

## 2021-09-28 DIAGNOSIS — R748 Abnormal levels of other serum enzymes: Secondary | ICD-10-CM

## 2021-09-28 DIAGNOSIS — K5904 Chronic idiopathic constipation: Secondary | ICD-10-CM

## 2021-09-28 DIAGNOSIS — E785 Hyperlipidemia, unspecified: Secondary | ICD-10-CM | POA: Diagnosis not present

## 2021-09-28 MED ORDER — LINACLOTIDE 72 MCG PO CAPS: 72.0000 ug | ORAL_CAPSULE | Freq: Every day | ORAL | 5 refills | Status: DC

## 2021-09-28 NOTE — Assessment & Plan Note (Signed)
Chronic, waxing and waning, unable to have BM with use of medication, takes 2hrs to have a BM in AM. No rectal pain, no blood in stool. associated with right side ABD pain (describes as spasms) Previous improvement with coffee and senna. Unable to tolerate metamucil (bloatiing). Last colonoscopy 2023: polyps removed and diverticulosis. Repeat in 20yr.  Advised to start miralax at hs Start linzess if no improvement with miralax. Maintain high fiber diet and regular exercise

## 2021-09-28 NOTE — Assessment & Plan Note (Signed)
Repeat TSH and T4 °

## 2021-09-28 NOTE — Progress Notes (Signed)
Established Patient Visit  Patient: Kim Delgado   DOB: 06-20-1975   46 y.o. Female  MRN: [id number] Visit Date: 10/02/2021  Subjective:    Chief Complaint  Patient presents with   Office Visit    DM / Hyperlipidemia F/u Doesn't check BS Pt not fasting  C/o of rt side pain, would like something for Constipation  No other concerns    HPI Chronic idiopathic constipation Chronic, waxing and waning, unable to have BM with use of medication, takes 2hrs to have a BM in AM. No rectal pain, no blood in stool. associated with right side ABD pain (describes as spasms) Previous improvement with coffee and senna. Unable to tolerate metamucil (bloatiing). Last colonoscopy 2023: polyps removed and diverticulosis. Repeat in 76yr.  Advised to start miralax at hs Start linzess if no improvement with miralax. Maintain high fiber diet and regular exercise  Diabetes (HCC) Controlled with metformin and glipizide Repeat hgbA1c and BMP  Hyperlipidemia associated with type 2 diabetes mellitus ([place] Repeat lipid panel  Acquired hypothyroidism Repeat TSH and T4   Wt Readings from Last 3 Encounters:  09/28/21 265 lb 6.4 oz (120.4 kg)  06/26/21 269 lb 12.8 oz (122.4 kg)  05/18/21 262 lb (118.8 kg)    BP Readings from Last 3 Encounters:  09/28/21 128/80  06/26/21 126/76  05/18/21 133/74    Reviewed medical, surgical, and social history today  Medications: Outpatient Medications Prior to Visit  Medication Sig   Black Currant Seed Oil 500 MG CAPS Take 500 mg by mouth daily.   EUTHYROX 150 MCG tablet Take 1 tablet (150 mcg total) by mouth daily before breakfast.   glipiZIDE (GLUCOTROL) 5 MG tablet Take 1 tablet (5 mg total) by mouth 2 (two) times daily with breakfast and lunch.   metFORMIN (GLUCOPHAGE-XR) 500 MG 24 hr tablet Take 2 tablets (1,000 mg total) by mouth 2 (two) times daily after a meal.   naproxen (NAPROSYN) 500 MG tablet Take 1 tablet (500 mg total) by mouth 2 (two)  times daily as needed.   OVER THE COUNTER MEDICATION Take 1 capsule by mouth daily. Super cell food  Sea moss with bladderwrack and burdock root   sodium chloride (OCEAN) 0.65 % SOLN nasal spray Place 1 spray into both nostrils daily as needed for congestion.   tiZANidine (ZANAFLEX) 4 MG tablet Take 1 tablet (4 mg total) by mouth every 8 (eight) hours as needed for muscle spasms. (Patient taking differently: Take 4 mg by mouth every 8 (eight) hours as needed (Pain).)   [DISCONTINUED] BIOTIN PO Take 1 tablet by mouth daily. Not own weekends (Patient not taking: Reported on 09/28/2021)   [DISCONTINUED] oxyCODONE (OXY IR/ROXICODONE) 5 MG immediate release tablet Take 1 tablet (5 mg total) by mouth every 6 (six) hours as needed for severe pain. (Patient not taking: Reported on 09/28/2021)   No facility-administered medications prior to visit.   Reviewed past medical and social history.   ROS per HPI above      Objective:  BP 128/80 (BP Location: Right Arm, Patient Position: Sitting, Cuff Size: Normal)   Pulse 83   Temp (!) 97.3 F (36.3 C) (Temporal)   Ht '5\' 8"'$  (1.727 m)   Wt 265 lb 6.4 oz (120.4 kg)   SpO2 95%   BMI 40.35 kg/m      Physical Exam Constitutional:      Appearance: She is obese.  Cardiovascular:  Rate and Rhythm: Normal rate and regular rhythm.     Pulses: Normal pulses.  Pulmonary:     Effort: Pulmonary effort is normal.     Breath sounds: Normal breath sounds.  Abdominal:     General: Bowel sounds are normal. There is no distension.     Palpations: Abdomen is soft.     Tenderness: There is no abdominal tenderness. There is no right CVA tenderness, left CVA tenderness or guarding.  Musculoskeletal:     Right lower leg: Edema present.     Left lower leg: Edema present.  Neurological:     Mental Status: She is alert and oriented to person, place, and time.     No results found for any visits on 09/28/21.    Assessment & Plan:    Problem List Items  Addressed This Visit       Digestive   Chronic idiopathic constipation    Chronic, waxing and waning, unable to have BM with use of medication, takes 2hrs to have a BM in AM. No rectal pain, no blood in stool. associated with right side ABD pain (describes as spasms) Previous improvement with coffee and senna. Unable to tolerate metamucil (bloatiing). Last colonoscopy 2023: polyps removed and diverticulosis. Repeat in 81yr.  Advised to start miralax at hs Start linzess if no improvement with miralax. Maintain high fiber diet and regular exercise      Relevant Medications   linaclotide (LINZESS) 72 MCG capsule     Endocrine   Acquired hypothyroidism    Repeat TSH and T4      Relevant Orders   TSH   T4, free   Diabetes ([place]    Controlled with metformin and glipizide Repeat hgbA1c and BMP      Relevant Orders   Basic metabolic panel   Hemoglobin A1c   Hyperlipidemia associated with type 2 diabetes mellitus (HCC)    Repeat lipid panel      Relevant Orders   Lipid panel     Other   Elevated lipase   Relevant Orders   Lipase   Other Visit Diagnoses     Breast cancer screening by mammogram       Relevant Orders   MM 3D SCREEN BREAST BILATERAL      Return in about 1 year (around 09/29/2022) for CPE (fasting).     CWilfred Lacy NP

## 2021-09-28 NOTE — Assessment & Plan Note (Signed)
Controlled with metformin and glipizide Repeat hgbA1c and BMP

## 2021-09-28 NOTE — Patient Instructions (Addendum)
Schedule appt with ophthalmology. Have report faxed to me Kim Delgado eye associate [facility]  Stop senna Start miralax daily at bedtime Maintain high fiber diet and adequate oral hydration. Will start PA for linzess. Schedule fasting lab and nurse visit appt.

## 2021-09-28 NOTE — Assessment & Plan Note (Signed)
Repeat lipid panel ?

## 2021-10-06 ENCOUNTER — Other Ambulatory Visit (INDEPENDENT_AMBULATORY_CARE_PROVIDER_SITE_OTHER): Payer: BC Managed Care – PPO

## 2021-10-06 ENCOUNTER — Ambulatory Visit (INDEPENDENT_AMBULATORY_CARE_PROVIDER_SITE_OTHER): Payer: BC Managed Care – PPO

## 2021-10-06 DIAGNOSIS — E1165 Type 2 diabetes mellitus with hyperglycemia: Secondary | ICD-10-CM | POA: Diagnosis not present

## 2021-10-06 DIAGNOSIS — Z23 Encounter for immunization: Secondary | ICD-10-CM | POA: Diagnosis not present

## 2021-10-06 DIAGNOSIS — E039 Hypothyroidism, unspecified: Secondary | ICD-10-CM | POA: Diagnosis not present

## 2021-10-06 LAB — BASIC METABOLIC PANEL
BUN: 11 mg/dL (ref 6–23)
CO2: 28 mEq/L (ref 19–32)
Calcium: 9.1 mg/dL (ref 8.4–10.5)
Chloride: 102 mEq/L (ref 96–112)
Creatinine, Ser: 0.75 mg/dL (ref 0.40–1.20)
GFR: 94.93 mL/min (ref >60.00)
Glucose, Bld: 163 mg/dL — ABNORMAL HIGH (ref 70–99)
Potassium: 3.8 mEq/L (ref 3.5–5.1)
Sodium: 138 mEq/L (ref 135–145)

## 2021-10-06 LAB — TSH: TSH: 8.07 u[IU]/mL — ABNORMAL HIGH (ref 0.35–5.50)

## 2021-10-06 LAB — T4, FREE: Free T4: 0.67 ng/dL (ref 0.60–1.60)

## 2021-10-06 LAB — HEMOGLOBIN A1C: Hgb A1c MFr Bld: 7.4 % — ABNORMAL HIGH (ref 4.6–6.5)

## 2021-10-06 MED ORDER — GLIPIZIDE 5 MG PO TABS: 5.0000 mg | ORAL_TABLET | Freq: Two times a day (BID) | ORAL | 1 refills | Status: DC

## 2021-10-06 MED ORDER — EUTHYROX 150 MCG PO TABS: 150.0000 ug | ORAL_TABLET | Freq: Every day | ORAL | 1 refills | Status: DC

## 2021-10-06 MED ORDER — METFORMIN HCL ER 500 MG PO TB24: 1000.0000 mg | ORAL_TABLET | Freq: Two times a day (BID) | ORAL | 1 refills | Status: DC

## 2021-10-06 NOTE — Progress Notes (Signed)
Per orders of Wilfred Lacy pt is here for Influenza vaccine pt received regular dose. Pt received influenza vaccine  in Left deltoid at 8:55 am . Pt tolerated influenza vaccine well.

## 2021-11-06 ENCOUNTER — Ambulatory Visit
Admission: RE | Admit: 2021-11-06 | Discharge: 2021-11-06 | Disposition: A | Discharge status: Home / Self Care | Payer: BC Managed Care – PPO | Source: Ambulatory Visit | Attending: Nurse Practitioner | Admitting: Nurse Practitioner

## 2021-11-06 DIAGNOSIS — Z1231 Encounter for screening mammogram for malignant neoplasm of breast: Secondary | ICD-10-CM | POA: Diagnosis not present

## 2021-11-11 ENCOUNTER — Other Ambulatory Visit: Payer: Self-pay | Admitting: Nurse Practitioner

## 2021-11-11 DIAGNOSIS — R928 Other abnormal and inconclusive findings on diagnostic imaging of breast: Secondary | ICD-10-CM

## 2021-11-16 ENCOUNTER — Ambulatory Visit
Admission: RE | Admit: 2021-11-16 | Discharge: 2021-11-16 | Disposition: A | Discharge status: Home / Self Care | Payer: BC Managed Care – PPO | Source: Ambulatory Visit | Attending: Nurse Practitioner | Admitting: Nurse Practitioner

## 2021-11-16 ENCOUNTER — Other Ambulatory Visit: Payer: Self-pay | Admitting: Nurse Practitioner

## 2021-11-16 DIAGNOSIS — D369 Benign neoplasm, unspecified site: Secondary | ICD-10-CM

## 2021-11-16 DIAGNOSIS — N6310 Unspecified lump in the right breast, unspecified quadrant: Secondary | ICD-10-CM

## 2021-11-16 DIAGNOSIS — N6315 Unspecified lump in the right breast, overlapping quadrants: Secondary | ICD-10-CM | POA: Diagnosis not present

## 2021-11-16 DIAGNOSIS — R928 Other abnormal and inconclusive findings on diagnostic imaging of breast: Secondary | ICD-10-CM

## 2021-11-23 ENCOUNTER — Ambulatory Visit
Admission: RE | Admit: 2021-11-23 | Discharge: 2021-11-23 | Disposition: A | Discharge status: Home / Self Care | Payer: BC Managed Care – PPO | Source: Ambulatory Visit | Attending: Nurse Practitioner | Admitting: Nurse Practitioner

## 2021-11-23 DIAGNOSIS — N6315 Unspecified lump in the right breast, overlapping quadrants: Secondary | ICD-10-CM | POA: Diagnosis not present

## 2021-11-23 DIAGNOSIS — R928 Other abnormal and inconclusive findings on diagnostic imaging of breast: Secondary | ICD-10-CM | POA: Diagnosis not present

## 2021-11-23 DIAGNOSIS — D241 Benign neoplasm of right breast: Secondary | ICD-10-CM | POA: Diagnosis not present

## 2021-11-23 DIAGNOSIS — D369 Benign neoplasm, unspecified site: Secondary | ICD-10-CM

## 2021-11-23 DIAGNOSIS — N6310 Unspecified lump in the right breast, unspecified quadrant: Secondary | ICD-10-CM

## 2021-11-23 HISTORY — PX: BREAST BIOPSY: SHX20

## 2021-12-04 HISTORY — PX: BREAST EXCISIONAL BIOPSY: SUR124

## 2021-12-07 ENCOUNTER — Ambulatory Visit: Payer: Self-pay | Admitting: Surgery

## 2021-12-07 DIAGNOSIS — D241 Benign neoplasm of right breast: Secondary | ICD-10-CM

## 2021-12-07 NOTE — H&P (Signed)
History of Present Illness: Kim Delgado is a 46 y.o. female who is seen today as an office consultation at the request of Dr. Lorayne Marek for evaluation of Breast Mass (Right breast mass x 2/) .   This is a 46 year old female with no family history of breast cancer but a remote history of biopsied papillomas who presents after recent screening mammogram revealed 2 masses in the right breast.  She was noted to have a mass in the right breast at 9:00, 7 cm from the nipple measuring 1.9 x 0.8 x 1.7 cm.  Also in the 9:00 region located 2 cm from the nipple, there are some dilated ducts.  Both of these areas were biopsied.  Both of these revealed intraductal papilloma with no sign of atypia.  The patient was having no symptoms prior to her mammogram.  She is status post laparoscopic cholecystectomy by Dr. Redmond Pulling earlier this year.   Review of Systems: A complete review of systems was obtained from the patient.  I have reviewed this information and discussed as appropriate with the patient.  See HPI as well for other ROS.  Review of Systems  Constitutional: Negative.   HENT: Negative.    Eyes: Negative.   Respiratory: Negative.    Cardiovascular: Negative.   Gastrointestinal:  Positive for constipation.  Genitourinary: Negative.   Musculoskeletal: Negative.   Skin: Negative.   Neurological: Negative.   Endo/Heme/Allergies: Negative.   Psychiatric/Behavioral: Negative.        Medical History: Past Medical History:  Diagnosis Date   Anemia    Diabetes mellitus without complication (CMS-HCC)    Thyroid disease     Patient Active Problem List  Diagnosis   Acquired hypothyroidism   Chronic idiopathic constipation   Diabetes (CMS-HCC)   Hyperlipidemia associated with type 2 diabetes mellitus    Morbid obesity (CMS-HCC)   S/P hysterectomy with oophorectomy   S/P laparoscopic cholecystectomy   Intraductal papilloma of breast, right    Past Surgical History:  Procedure Laterality Date    CHOLECYSTECTOMY     HYSTERECTOMY     LAPAROSCOPIC TUBAL LIGATION       No Known Allergies  Current Outpatient Medications on File Prior to Visit  Medication Sig Dispense Refill   glipiZIDE (GLUCOTROL) 5 MG tablet TAKE 1 TABLET BY MOUTH TWICE DAILY WITH BREAKFAST AND WITH LUNCH     levothyroxine (SYNTHROID) 150 MCG tablet Take by mouth     metFORMIN (GLUCOPHAGE-XR) 500 MG XR tablet Take by mouth     herbal drugs Cap Take by mouth (Patient not taking: Reported on 12/07/2021)     multivitamin (MULTIPLE VITAMINS ORAL) Take by mouth (Patient not taking: Reported on 12/07/2021)     naproxen (NAPROSYN) 500 MG tablet Take 500 mg by mouth 2 (two) times daily (Patient not taking: Reported on 12/07/2021)     NON FORMULARY  (Patient not taking: Reported on 12/07/2021)     tiZANidine (ZANAFLEX) 4 MG tablet Take by mouth (Patient not taking: Reported on 12/07/2021)     No current facility-administered medications on file prior to visit.    Family History  Problem Relation Age of Onset   Stroke Mother    Obesity Mother    High blood pressure (Hypertension) Mother    Diabetes Mother      Social History   Tobacco Use  Smoking Status Never  Smokeless Tobacco Never     Social History   Socioeconomic History   Marital status: Married  Tobacco Use  Smoking status: Never   Smokeless tobacco: Never  Vaping Use   Vaping Use: Never used  Substance and Sexual Activity   Alcohol use: Never   Drug use: Never    Objective:    Vitals:   12/07/21 1006  BP: 126/78  Pulse: 93  Temp: 36.7 C (98 F)  SpO2: 98%  Weight: (!) 120.3 kg (265 lb 3.2 oz)  Height: 172.7 cm ('5\' 8"'$ )    Body mass index is 40.32 kg/m.  Physical Exam   Constitutional:  WDWN in NAD, conversant, no obvious deformities; lying in bed comfortably Eyes:  Pupils equal, round; sclera anicteric; moist conjunctiva; no lid lag HENT:  Oral mucosa moist; good dentition  Neck:  No masses palpated, trachea midline; no  thyromegaly Lungs:  CTA bilaterally; normal respiratory effort Breasts:  symmetric, no nipple changes; no palpable masses or lymphadenopathy on either side CV:  Regular rate and rhythm; no murmurs; extremities well-perfused with no edema Abd:  +bowel sounds, soft, non-tender, no palpable organomegaly; no palpable hernias Musc: Normal gait; no apparent clubbing or cyanosis in extremities Lymphatic:  No palpable cervical or axillary lymphadenopathy Skin:  Warm, dry; no sign of jaundice Psychiatric - alert and oriented x 4; calm mood and affect   Labs, Imaging and Diagnostic Testing: Diagnosis 1. Breast, right, needle core biopsy, 9 o'clock, 7cmfn, ribbon INTRADUCTAL PAPILLOMA WITH APOCRINE METAPLASIA AND USUAL DUCTAL HYPERPLASIA. SEE NOTE. 2. Breast, right, needle core biopsy, 9 o'clock, 2cmfn, coil INTRADUCTAL PAPILLOMA WITH USUAL DUCTAL HYPERPLASIA. SEE NOTE. Diagnosis Note 1. & 2. [place] was notified on 11/24/2021. Claudette Laws MD Pathologist, Electronic Signature (Case signed 11/24/2021)  CLINICAL DATA:  Patient was recalled from screening mammogram for a possible mass and dilated ducts in the right breast.   EXAM: DIGITAL DIAGNOSTIC UNILATERAL RIGHT MAMMOGRAM WITH TOMOSYNTHESIS; ULTRASOUND RIGHT BREAST LIMITED   TECHNIQUE: Right digital diagnostic mammography and breast tomosynthesis was performed.; Targeted ultrasound examination of the right breast was performed   COMPARISON:  Screening mammogram dated 11/06/2021. No additional priors available for comparison. Patient states that she has a prior history of a right breast papilloma excised.   ACR Breast Density Category b: There are scattered areas of fibroglandular density.   FINDINGS: Additional imaging the right breast was performed. There is persistence of dilated ducts and a 2.0 cm mass in the lateral aspect of the breast.   I do not palpate a mass in the subareolar region of the right  breast or the lateral aspect of the right breast.   Targeted ultrasound is performed, targeted ultrasound is performed, showing a indeterminate hypoechoic mass in the right breast at 9 o'clock 7 cm from the nipple measuring 1.9 x 0.8 x 1.7 cm. The mass appears to be partially cystic but a solid component can not be excluded. There are dilated ducts in the 9 o'clock region of the right breast 2 cm from the nipple. There is echogenic material within the ducts without a focal solid mass. Sonographic evaluation of the right axilla does not show any enlarged adenopathy.   IMPRESSION: Indeterminate 1.9 cm mass in the 9 o'clock region of the right breast 7 cm from the nipple. Indeterminate ductal dilatation in the 9 o'clock region of the right breast 2 cm from the nipple.   RECOMMENDATION: Ultrasound-guided core biopsies of the 1.9 cm mass in the 9 o'clock region of the right breast and the ductal dilatation in the 9 o'clock region of the right breast 2 cm from the  nipple is recommended. The biopsies will be scheduled at the patient's convenience.   I have discussed the findings and recommendations with the patient. If applicable, a reminder letter will be sent to the patient regarding the next appointment.   BI-RADS CATEGORY  4: Suspicious.     Electronically Signed   By: Lillia Mountain M.D.   On: 11/16/2021 11:05    Assessment and Plan:  Diagnoses and all orders for this visit:  Intraductal papilloma of breast, right    Recommend right breast radioactive seed localized lumpectomy x 2. The surgical procedure has been discussed with the patient.  Potential risks, benefits, alternative treatments, and expected outcomes have been explained.  All of the patient's questions at this time have been answered.  The likelihood of reaching the patient's treatment goal is good.  The patient understand the proposed surgical procedure and wishes to proceed.   No follow-ups on file.  Elonzo Sopp  Jearld Adjutant, MD  12/07/2021 10:32 AM

## 2021-12-07 NOTE — H&P (View-Only) (Signed)
History of Present Illness: Kim Delgado is a 46 y.o. female who is seen today as an office consultation at the request of Dr. Lorayne Marek for evaluation of Breast Mass (Right breast mass x 2/) .   This is a 46 year old female with no family history of breast cancer but a remote history of biopsied papillomas who presents after recent screening mammogram revealed 2 masses in the right breast.  She was noted to have a mass in the right breast at 9:00, 7 cm from the nipple measuring 1.9 x 0.8 x 1.7 cm.  Also in the 9:00 region located 2 cm from the nipple, there are some dilated ducts.  Both of these areas were biopsied.  Both of these revealed intraductal papilloma with no sign of atypia.  The patient was having no symptoms prior to her mammogram.  She is status post laparoscopic cholecystectomy by Dr. Redmond Pulling earlier this year.   Review of Systems: A complete review of systems was obtained from the patient.  I have reviewed this information and discussed as appropriate with the patient.  See HPI as well for other ROS.  Review of Systems  Constitutional: Negative.   HENT: Negative.    Eyes: Negative.   Respiratory: Negative.    Cardiovascular: Negative.   Gastrointestinal:  Positive for constipation.  Genitourinary: Negative.   Musculoskeletal: Negative.   Skin: Negative.   Neurological: Negative.   Endo/Heme/Allergies: Negative.   Psychiatric/Behavioral: Negative.        Medical History: Past Medical History:  Diagnosis Date   Anemia    Diabetes mellitus without complication (CMS-HCC)    Thyroid disease     Patient Active Problem List  Diagnosis   Acquired hypothyroidism   Chronic idiopathic constipation   Diabetes (CMS-HCC)   Hyperlipidemia associated with type 2 diabetes mellitus    Morbid obesity (CMS-HCC)   S/P hysterectomy with oophorectomy   S/P laparoscopic cholecystectomy   Intraductal papilloma of breast, right    Past Surgical History:  Procedure Laterality Date    CHOLECYSTECTOMY     HYSTERECTOMY     LAPAROSCOPIC TUBAL LIGATION       No Known Allergies  Current Outpatient Medications on File Prior to Visit  Medication Sig Dispense Refill   glipiZIDE (GLUCOTROL) 5 MG tablet TAKE 1 TABLET BY MOUTH TWICE DAILY WITH BREAKFAST AND WITH LUNCH     levothyroxine (SYNTHROID) 150 MCG tablet Take by mouth     metFORMIN (GLUCOPHAGE-XR) 500 MG XR tablet Take by mouth     herbal drugs Cap Take by mouth (Patient not taking: Reported on 12/07/2021)     multivitamin (MULTIPLE VITAMINS ORAL) Take by mouth (Patient not taking: Reported on 12/07/2021)     naproxen (NAPROSYN) 500 MG tablet Take 500 mg by mouth 2 (two) times daily (Patient not taking: Reported on 12/07/2021)     NON FORMULARY  (Patient not taking: Reported on 12/07/2021)     tiZANidine (ZANAFLEX) 4 MG tablet Take by mouth (Patient not taking: Reported on 12/07/2021)     No current facility-administered medications on file prior to visit.    Family History  Problem Relation Age of Onset   Stroke Mother    Obesity Mother    High blood pressure (Hypertension) Mother    Diabetes Mother      Social History   Tobacco Use  Smoking Status Never  Smokeless Tobacco Never     Social History   Socioeconomic History   Marital status: Married  Tobacco Use  Smoking status: Never   Smokeless tobacco: Never  Vaping Use   Vaping Use: Never used  Substance and Sexual Activity   Alcohol use: Never   Drug use: Never    Objective:    Vitals:   12/07/21 1006  BP: 126/78  Pulse: 93  Temp: 36.7 C (98 F)  SpO2: 98%  Weight: (!) 120.3 kg (265 lb 3.2 oz)  Height: 172.7 cm ('5\' 8"'$ )    Body mass index is 40.32 kg/m.  Physical Exam   Constitutional:  WDWN in NAD, conversant, no obvious deformities; lying in bed comfortably Eyes:  Pupils equal, round; sclera anicteric; moist conjunctiva; no lid lag HENT:  Oral mucosa moist; good dentition  Neck:  No masses palpated, trachea midline; no  thyromegaly Lungs:  CTA bilaterally; normal respiratory effort Breasts:  symmetric, no nipple changes; no palpable masses or lymphadenopathy on either side CV:  Regular rate and rhythm; no murmurs; extremities well-perfused with no edema Abd:  +bowel sounds, soft, non-tender, no palpable organomegaly; no palpable hernias Musc: Normal gait; no apparent clubbing or cyanosis in extremities Lymphatic:  No palpable cervical or axillary lymphadenopathy Skin:  Warm, dry; no sign of jaundice Psychiatric - alert and oriented x 4; calm mood and affect   Labs, Imaging and Diagnostic Testing: Diagnosis 1. Breast, right, needle core biopsy, 9 o'clock, 7cmfn, ribbon INTRADUCTAL PAPILLOMA WITH APOCRINE METAPLASIA AND USUAL DUCTAL HYPERPLASIA. SEE NOTE. 2. Breast, right, needle core biopsy, 9 o'clock, 2cmfn, coil INTRADUCTAL PAPILLOMA WITH USUAL DUCTAL HYPERPLASIA. SEE NOTE. Diagnosis Note 1. & 2. [place] was notified on 11/24/2021. Claudette Laws MD Pathologist, Electronic Signature (Case signed 11/24/2021)  CLINICAL DATA:  Patient was recalled from screening mammogram for a possible mass and dilated ducts in the right breast.   EXAM: DIGITAL DIAGNOSTIC UNILATERAL RIGHT MAMMOGRAM WITH TOMOSYNTHESIS; ULTRASOUND RIGHT BREAST LIMITED   TECHNIQUE: Right digital diagnostic mammography and breast tomosynthesis was performed.; Targeted ultrasound examination of the right breast was performed   COMPARISON:  Screening mammogram dated 11/06/2021. No additional priors available for comparison. Patient states that she has a prior history of a right breast papilloma excised.   ACR Breast Density Category b: There are scattered areas of fibroglandular density.   FINDINGS: Additional imaging the right breast was performed. There is persistence of dilated ducts and a 2.0 cm mass in the lateral aspect of the breast.   I do not palpate a mass in the subareolar region of the right  breast or the lateral aspect of the right breast.   Targeted ultrasound is performed, targeted ultrasound is performed, showing a indeterminate hypoechoic mass in the right breast at 9 o'clock 7 cm from the nipple measuring 1.9 x 0.8 x 1.7 cm. The mass appears to be partially cystic but a solid component can not be excluded. There are dilated ducts in the 9 o'clock region of the right breast 2 cm from the nipple. There is echogenic material within the ducts without a focal solid mass. Sonographic evaluation of the right axilla does not show any enlarged adenopathy.   IMPRESSION: Indeterminate 1.9 cm mass in the 9 o'clock region of the right breast 7 cm from the nipple. Indeterminate ductal dilatation in the 9 o'clock region of the right breast 2 cm from the nipple.   RECOMMENDATION: Ultrasound-guided core biopsies of the 1.9 cm mass in the 9 o'clock region of the right breast and the ductal dilatation in the 9 o'clock region of the right breast 2 cm from the  nipple is recommended. The biopsies will be scheduled at the patient's convenience.   I have discussed the findings and recommendations with the patient. If applicable, a reminder letter will be sent to the patient regarding the next appointment.   BI-RADS CATEGORY  4: Suspicious.     Electronically Signed   By: Lillia Mountain M.D.   On: 11/16/2021 11:05    Assessment and Plan:  Diagnoses and all orders for this visit:  Intraductal papilloma of breast, right    Recommend right breast radioactive seed localized lumpectomy x 2. The surgical procedure has been discussed with the patient.  Potential risks, benefits, alternative treatments, and expected outcomes have been explained.  All of the patient's questions at this time have been answered.  The likelihood of reaching the patient's treatment goal is good.  The patient understand the proposed surgical procedure and wishes to proceed.   No follow-ups on file.  Karianne Nogueira  Jearld Adjutant, MD  12/07/2021 10:32 AM

## 2021-12-09 ENCOUNTER — Other Ambulatory Visit: Payer: Self-pay | Admitting: Surgery

## 2021-12-09 DIAGNOSIS — D241 Benign neoplasm of right breast: Secondary | ICD-10-CM

## 2021-12-14 NOTE — Pre-Procedure Instructions (Signed)
Surgical Instructions    Your procedure is scheduled on Monday, December 18.  Report to [facility] Main Entrance "A" at 5:30 A.M., then check in with the Admitting office.  Call this number if you have problems the morning of surgery:  [phone]   If you have any questions prior to your surgery date call [phone]: Open Monday-Friday 8am-4pm If you experience any cold or flu symptoms such as cough, fever, chills, shortness of breath, etc. between now and your scheduled surgery, please notify us at the above number     Remember:  Do not eat after midnight the night before your surgery  You may drink clear liquids until 4:30AM the morning of your surgery.   Clear liquids allowed are: Water, Non-Citrus Juices (without pulp), Carbonated Beverages, Clear Tea, Black Coffee ONLY (NO MILK, CREAM OR POWDERED CREAMER of any kind), and Gatorade    Take these medicines the morning of surgery with A SIP OF WATER:  EUTHYROX  sodium chloride (OCEAN) 0.65 % SOLN nasal spray  if needed tiZANidine (ZANAFLEX)  if needed linaclotide Rolan Lipa)  if needed  As of today, STOP taking any Aspirin (unless otherwise instructed by your surgeon) Aleve, Naproxen, Ibuprofen, Motrin, Advil, Goody's, BC's, all herbal medications, fish oil, and all vitamins.  WHAT DO I DO ABOUT MY DIABETES MEDICATION?   Do not take oral diabetes medicines (pills) the morning of surgery. DO NOT TAKE Glipizide (Glucotrol) or Metformin (Glucophage) the day of surgery.   The day of surgery, do not take other diabetes injectables, including Byetta (exenatide), Bydureon (exenatide ER), Victoza (liraglutide), or Trulicity (dulaglutide).  If your CBG is greater than 220 mg/dL, you may take  of your sliding scale (correction) dose of insulin.   HOW TO MANAGE YOUR DIABETES BEFORE AND AFTER SURGERY  Why is it important to control my blood sugar before and after surgery? Improving blood sugar levels before and after surgery helps  healing and can limit problems. A way of improving blood sugar control is eating a healthy diet by:  Eating less sugar and carbohydrates  Increasing activity/exercise  Talking with your doctor about reaching your blood sugar goals High blood sugars (greater than 180 mg/dL) can raise your risk of infections and slow your recovery, so you will need to focus on controlling your diabetes during the weeks before surgery. Make sure that the doctor who takes care of your diabetes knows about your planned surgery including the date and location.  How do I manage my blood sugar before surgery? Check your blood sugar at least 4 times a day, starting 2 days before surgery, to make sure that the level is not too high or low.  Check your blood sugar the morning of your surgery when you wake up and every 2 hours until you get to the Short Stay unit.  If your blood sugar is less than 70 mg/dL, you will need to treat for low blood sugar: Do not take insulin. Treat a low blood sugar (less than 70 mg/dL) with  cup of clear juice (cranberry or apple), 4 glucose tablets, OR glucose gel. Recheck blood sugar in 15 minutes after treatment (to make sure it is greater than 70 mg/dL). If your blood sugar is not greater than 70 mg/dL on recheck, call [phone] for further instructions. Report your blood sugar to the short stay nurse when you get to Short Stay.  If you are admitted to the hospital after surgery: Your blood sugar will be checked by the staff  and you will probably be given insulin after surgery (instead of oral diabetes medicines) to make sure you have good blood sugar levels. The goal for blood sugar control after surgery is 80-180 mg/dL.  [facility] is not responsible for any belongings or valuables.    Do NOT Smoke (Tobacco/Vaping)  24 hours prior to your procedure  If you use a CPAP at night, you may bring your mask for your overnight stay.   Contacts, glasses, hearing aids, dentures or  partials may not be worn into surgery, please bring cases for these belongings   For patients admitted to the hospital, discharge time will be determined by your treatment team.   Patients discharged the day of surgery will not be allowed to drive home, and someone needs to stay with them for 24 hours.   SURGICAL WAITING ROOM VISITATION Patients having surgery or a procedure may have no more than 2 support people in the waiting area - these visitors may rotate.   Children under the age of 36 must have an adult with them who is not the patient. If the patient needs to stay at the hospital during part of their recovery, the visitor guidelines for inpatient rooms apply. Pre-op nurse will coordinate an appropriate time for 1 support person to accompany patient in pre-op.  This support person may not rotate.   Please refer to [email or web address] for the visitor guidelines for Inpatients (after your surgery is over and you are in a regular room).    Special instructions:    Oral Hygiene is also important to reduce your risk of infection.  Remember - BRUSH YOUR TEETH THE MORNING OF SURGERY WITH YOUR REGULAR TOOTHPASTE   [place]- Preparing For Surgery  Before surgery, you can play an important role. Because skin is not sterile, your skin needs to be as free of germs as possible. You can reduce the number of germs on your skin by washing with CHG (chlorahexidine gluconate) Soap before surgery.  CHG is an antiseptic cleaner which kills germs and bonds with the skin to continue killing germs even after washing.     Please do not use if you have an allergy to CHG or antibacterial soaps. If your skin becomes reddened/irritated stop using the CHG.  Do not shave (including legs and underarms) for at least 48 hours prior to first CHG shower. It is OK to shave your face.  Please follow these instructions carefully.     Shower the NIGHT BEFORE  SURGERY and the MORNING OF SURGERY with CHG Soap.   If you chose to wash your hair, wash your hair first as usual with your normal shampoo. After you shampoo, rinse your hair and body thoroughly to remove the shampoo.  Then [employer] and genitals (private parts) with your normal soap and rinse thoroughly to remove soap.  After that Use CHG Soap as you would any other liquid soap. You can apply CHG directly to the skin and wash gently with a scrungie or a clean washcloth.   Apply the CHG Soap to your body ONLY FROM THE NECK DOWN.  Do not use on open wounds or open sores. Avoid contact with your eyes, ears, mouth and genitals (private parts). Wash Face and genitals (private parts)  with your normal soap.   Wash thoroughly, paying special attention to the area where your surgery will be performed.  Thoroughly rinse your body with warm water from the neck down.  DO NOT shower/wash with your normal soap  after using and rinsing off the CHG Soap.  Pat yourself dry with a CLEAN TOWEL.  Wear CLEAN PAJAMAS to bed the night before surgery  Place CLEAN SHEETS on your bed the night before your surgery  DO NOT SLEEP WITH PETS.   Day of Surgery:  Take a shower with CHG soap. Wear Clean/Comfortable clothing the morning of surgery Do not wear jewelry or makeup. Do not wear lotions, powders, perfumes/cologne or deodorant. Do not shave 48 hours prior to surgery.  Men may shave face and neck. Do not bring valuables to the hospital. Do not wear nail polish, gel polish, artificial nails, or any other type of covering on natural nails (fingers and toes) If you have artificial nails or gel coating that need to be removed by a nail salon, please have this removed prior to surgery. Artificial nails or gel coating may interfere with anesthesia's ability to adequately monitor your vital signs. Remember to brush your teeth WITH YOUR REGULAR TOOTHPASTE.    If you received a COVID test during your pre-op visit,  it is requested that you wear a mask when out in public, stay away from anyone that may not be feeling well, and notify your surgeon if you develop symptoms. If you have been in contact with anyone that has tested positive in the last 10 days, please notify your surgeon.    Please read over the following fact sheets that you were given.

## 2021-12-15 ENCOUNTER — Encounter (HOSPITAL_COMMUNITY): Payer: Self-pay

## 2021-12-15 ENCOUNTER — Encounter (HOSPITAL_COMMUNITY)
Admission: RE | Admit: 2021-12-15 | Discharge: 2021-12-15 | Disposition: A | Discharge status: Home / Self Care | Payer: BC Managed Care – PPO | Source: Ambulatory Visit | Attending: Surgery | Admitting: Surgery

## 2021-12-15 ENCOUNTER — Other Ambulatory Visit: Payer: Self-pay

## 2021-12-15 VITALS — BP 126/86 | HR 85 | Temp 97.7°F | Resp 17 | Ht 68.0 in | Wt 261.7 lb

## 2021-12-15 DIAGNOSIS — E119 Type 2 diabetes mellitus without complications: Secondary | ICD-10-CM | POA: Diagnosis not present

## 2021-12-15 DIAGNOSIS — Z01818 Encounter for other preprocedural examination: Secondary | ICD-10-CM

## 2021-12-15 DIAGNOSIS — K769 Liver disease, unspecified: Secondary | ICD-10-CM | POA: Diagnosis not present

## 2021-12-15 DIAGNOSIS — Z01812 Encounter for preprocedural laboratory examination: Secondary | ICD-10-CM | POA: Diagnosis not present

## 2021-12-15 HISTORY — DX: Family history of other specified conditions: Z84.89

## 2021-12-15 LAB — COMPREHENSIVE METABOLIC PANEL
ALT: 19 U/L (ref 0–44)
AST: 17 U/L (ref 15–41)
Albumin: 4 g/dL (ref 3.5–5.0)
Alkaline Phosphatase: 72 U/L (ref 38–126)
Anion gap: 10 (ref 5–15)
BUN: 8 mg/dL (ref 6–20)
CO2: 26 mmol/L (ref 22–32)
Calcium: 9.1 mg/dL (ref 8.9–10.3)
Chloride: 103 mmol/L (ref 98–111)
Creatinine, Ser: 0.67 mg/dL (ref 0.44–1.00)
GFR, Estimated: >60 mL/min (ref >60)
Glucose, Bld: 190 mg/dL — ABNORMAL HIGH (ref 70–99)
Potassium: 4.4 mmol/L (ref 3.5–5.1)
Sodium: 139 mmol/L (ref 135–145)
Total Bilirubin: 0.5 mg/dL (ref 0.3–1.2)
Total Protein: 7.2 g/dL (ref 6.5–8.1)

## 2021-12-15 LAB — CBC
HCT: 40 % (ref 36.0–46.0)
Hemoglobin: 12.7 g/dL (ref 12.0–15.0)
MCH: 28.4 pg (ref 26.0–34.0)
MCHC: 31.8 g/dL (ref 30.0–36.0)
MCV: 89.5 fL (ref 80.0–100.0)
Platelets: 357 10*3/uL (ref 150–400)
RBC: 4.47 MIL/uL (ref 3.87–5.11)
RDW: 13.4 % (ref 11.5–15.5)
WBC: 6.2 10*3/uL (ref 4.0–10.5)
nRBC: 0 % (ref 0.0–0.2)

## 2021-12-15 LAB — HEMOGLOBIN A1C
Hgb A1c MFr Bld: 7.3 % — ABNORMAL HIGH (ref 4.8–5.6)
Mean Plasma Glucose: 163 mg/dL

## 2021-12-15 LAB — GLUCOSE, CAPILLARY: Glucose-Capillary: 188 mg/dL — ABNORMAL HIGH (ref 70–99)

## 2021-12-15 NOTE — Progress Notes (Addendum)
PCP - Wilfred Lacy, NP Cardiologist - denies  PPM/ICD - n/a  Chest x-ray - n/a EKG - 05/14/21 Stress Test - denies ECHO - denies Cardiac Cath - denies  Sleep Study - pt reports a negative sleep study in the past. CPAP - denies  Pt only checks CBG when symptomatic or maybe once every 2 weeks. No accurate fasting levels to document. Last A1C 7.4, will obtain new level today. CBG at PAT 188.   Last dose of GLP1 agonist- n/a  GLP1 instructions: n/a  Blood Thinner Instructions: n/a Aspirin Instructions: n/a  ERAS Protcol -Clear liquids until 0430 DOS PRE-SURGERY Ensure or G2- none ordered.  COVID TEST- n/a  Anesthesia review: Yes, seed placement.   Patient denies shortness of breath, fever, cough and chest pain at PAT appointment   All instructions explained to the patient, with a verbal understanding of the material. Patient agrees to go over the instructions while at home for a better understanding. Patient also instructed to self quarantine after being tested for COVID-19. The opportunity to ask questions was provided.

## 2021-12-16 ENCOUNTER — Ambulatory Visit
Admission: RE | Admit: 2021-12-16 | Discharge: 2021-12-16 | Disposition: A | Discharge status: Home / Self Care | Payer: BC Managed Care – PPO | Source: Ambulatory Visit | Attending: Surgery | Admitting: Surgery

## 2021-12-16 DIAGNOSIS — R928 Other abnormal and inconclusive findings on diagnostic imaging of breast: Secondary | ICD-10-CM | POA: Diagnosis not present

## 2021-12-16 DIAGNOSIS — D241 Benign neoplasm of right breast: Secondary | ICD-10-CM

## 2021-12-16 HISTORY — PX: BREAST BIOPSY: SHX20

## 2021-12-20 NOTE — Anesthesia Preprocedure Evaluation (Signed)
Anesthesia Evaluation  Patient identified by MRN, date of birth, ID band Patient awake    Reviewed: Allergy & Precautions, NPO status , Patient's Chart, lab work & pertinent test results  History of Anesthesia Complications Negative for: history of anesthetic complications  Airway Mallampati: III  TM Distance: >3 FB Neck ROM: Full    Dental  (+) Dental Advisory Given, Teeth Intact   Pulmonary former smoker   Pulmonary exam normal        Cardiovascular negative cardio ROS Normal cardiovascular exam     Neuro/Psych negative neurological ROS  negative psych ROS   GI/Hepatic negative GI ROS, Neg liver ROS,,,  Endo/Other  diabetes, Type 2, Oral Hypoglycemic AgentsHypothyroidism  Morbid obesity  Renal/GU negative Renal ROS     Musculoskeletal negative musculoskeletal ROS (+)    Abdominal   Peds  Hematology negative hematology ROS (+)   Anesthesia Other Findings   Reproductive/Obstetrics  s/p hysterectomy                              Anesthesia Physical Anesthesia Plan  ASA: 3  Anesthesia Plan: General   Post-op Pain Management: Tylenol PO (pre-op)* and Celebrex PO (pre-op)*   Induction: Intravenous  PONV Risk Score and Plan: 3 and Treatment may vary due to age or medical condition, Ondansetron, Dexamethasone, Midazolam and Scopolamine patch - Pre-op  Airway Management Planned: LMA  Additional Equipment: None  Intra-op Plan:   Post-operative Plan: Extubation in OR  Informed Consent: I have reviewed the patients History and Physical, chart, labs and discussed the procedure including the risks, benefits and alternatives for the proposed anesthesia with the patient or authorized representative who has indicated his/her understanding and acceptance.     Dental advisory given  Plan Discussed with: CRNA and Anesthesiologist  Anesthesia Plan Comments:        Anesthesia  Quick Evaluation

## 2021-12-21 ENCOUNTER — Ambulatory Visit
Admission: RE | Admit: 2021-12-21 | Discharge: 2021-12-21 | Disposition: A | Discharge status: Home / Self Care | Payer: BC Managed Care – PPO | Source: Ambulatory Visit | Attending: Surgery | Admitting: Surgery

## 2021-12-21 ENCOUNTER — Ambulatory Visit (HOSPITAL_COMMUNITY): Payer: BC Managed Care – PPO | Admitting: Physician Assistant

## 2021-12-21 ENCOUNTER — Other Ambulatory Visit: Payer: Self-pay

## 2021-12-21 ENCOUNTER — Encounter (HOSPITAL_COMMUNITY): Payer: Self-pay | Admitting: Surgery

## 2021-12-21 ENCOUNTER — Encounter (HOSPITAL_COMMUNITY)
Admission: RE | Disposition: A | Discharge status: Home / Self Care | Payer: Self-pay | Source: Home / Self Care | Attending: Surgery

## 2021-12-21 ENCOUNTER — Ambulatory Visit (HOSPITAL_COMMUNITY)
Admission: RE | Admit: 2021-12-21 | Discharge: 2021-12-21 | Disposition: A | Discharge status: Home / Self Care | Payer: BC Managed Care – PPO | Attending: Surgery | Admitting: Surgery

## 2021-12-21 DIAGNOSIS — E119 Type 2 diabetes mellitus without complications: Secondary | ICD-10-CM | POA: Diagnosis not present

## 2021-12-21 DIAGNOSIS — D241 Benign neoplasm of right breast: Secondary | ICD-10-CM

## 2021-12-21 DIAGNOSIS — Z79890 Hormone replacement therapy (postmenopausal): Secondary | ICD-10-CM | POA: Diagnosis not present

## 2021-12-21 DIAGNOSIS — Z7984 Long term (current) use of oral hypoglycemic drugs: Secondary | ICD-10-CM | POA: Insufficient documentation

## 2021-12-21 DIAGNOSIS — Z87891 Personal history of nicotine dependence: Secondary | ICD-10-CM | POA: Diagnosis not present

## 2021-12-21 DIAGNOSIS — E039 Hypothyroidism, unspecified: Secondary | ICD-10-CM | POA: Insufficient documentation

## 2021-12-21 DIAGNOSIS — Z6839 Body mass index (BMI) 39.0-39.9, adult: Secondary | ICD-10-CM | POA: Diagnosis not present

## 2021-12-21 DIAGNOSIS — N62 Hypertrophy of breast: Secondary | ICD-10-CM | POA: Diagnosis not present

## 2021-12-21 DIAGNOSIS — R921 Mammographic calcification found on diagnostic imaging of breast: Secondary | ICD-10-CM | POA: Diagnosis not present

## 2021-12-21 DIAGNOSIS — N6011 Diffuse cystic mastopathy of right breast: Secondary | ICD-10-CM | POA: Diagnosis not present

## 2021-12-21 DIAGNOSIS — R928 Other abnormal and inconclusive findings on diagnostic imaging of breast: Secondary | ICD-10-CM | POA: Diagnosis not present

## 2021-12-21 HISTORY — PX: BREAST LUMPECTOMY WITH RADIOACTIVE SEED LOCALIZATION: SHX6424

## 2021-12-21 LAB — GLUCOSE, CAPILLARY
Glucose-Capillary: 184 mg/dL — ABNORMAL HIGH (ref 70–99)
Glucose-Capillary: 175 mg/dL — ABNORMAL HIGH (ref 70–99)

## 2021-12-21 SURGERY — BREAST LUMPECTOMY WITH RADIOACTIVE SEED LOCALIZATION
Anesthesia: General | Site: Breast | Laterality: Right

## 2021-12-21 MED ORDER — FENTANYL CITRATE (PF) 100 MCG/2ML IJ SOLN
INTRAMUSCULAR | Status: AC
  Administered 2021-12-21: 50 ug via INTRAVENOUS
  Filled 2021-12-21: qty 2

## 2021-12-21 MED ORDER — LIDOCAINE 2% (20 MG/ML) 5 ML SYRINGE
INTRAMUSCULAR | Status: AC
  Filled 2021-12-21: qty 10

## 2021-12-21 MED ORDER — CEFAZOLIN SODIUM-DEXTROSE 2-4 GM/100ML-% IV SOLN
2.0000 g | INTRAVENOUS | Status: AC
  Administered 2021-12-21: 2 g via INTRAVENOUS
  Filled 2021-12-21: qty 100

## 2021-12-21 MED ORDER — ROCURONIUM BROMIDE 10 MG/ML (PF) SYRINGE
PREFILLED_SYRINGE | INTRAVENOUS | Status: AC
  Filled 2021-12-21: qty 20

## 2021-12-21 MED ORDER — INSULIN ASPART 100 UNIT/ML IJ SOLN: 0.0000 [IU] | INTRAMUSCULAR | Status: DC | PRN

## 2021-12-21 MED ORDER — SUCCINYLCHOLINE CHLORIDE 200 MG/10ML IV SOSY
PREFILLED_SYRINGE | INTRAVENOUS | Status: AC
  Filled 2021-12-21: qty 10

## 2021-12-21 MED ORDER — PROPOFOL 10 MG/ML IV BOLUS
INTRAVENOUS | Status: DC | PRN
  Administered 2021-12-21: 200 mg via INTRAVENOUS

## 2021-12-21 MED ORDER — MIDAZOLAM HCL 2 MG/2ML IJ SOLN
INTRAMUSCULAR | Status: DC | PRN
  Administered 2021-12-21: 2 mg via INTRAVENOUS

## 2021-12-21 MED ORDER — OXYCODONE HCL 5 MG PO TABS: 5.0000 mg | ORAL_TABLET | Freq: Once | ORAL | Status: AC | PRN

## 2021-12-21 MED ORDER — CELECOXIB 200 MG PO CAPS
200.0000 mg | ORAL_CAPSULE | Freq: Once | ORAL | Status: AC
  Administered 2021-12-21: 200 mg via ORAL
  Filled 2021-12-21: qty 1

## 2021-12-21 MED ORDER — EPHEDRINE 5 MG/ML INJ
INTRAVENOUS | Status: AC
  Filled 2021-12-21: qty 10

## 2021-12-21 MED ORDER — LACTATED RINGERS IV SOLN: INTRAVENOUS | Status: DC

## 2021-12-21 MED ORDER — FENTANYL CITRATE (PF) 100 MCG/2ML IJ SOLN
25.0000 ug | INTRAMUSCULAR | Status: DC | PRN
  Administered 2021-12-21 (×2): 50 ug via INTRAVENOUS

## 2021-12-21 MED ORDER — CHLORHEXIDINE GLUCONATE CLOTH 2 % EX PADS: 6.0000 | MEDICATED_PAD | Freq: Once | CUTANEOUS | Status: DC

## 2021-12-21 MED ORDER — BUPIVACAINE-EPINEPHRINE 0.25% -1:200000 IJ SOLN
INTRAMUSCULAR | Status: DC | PRN
  Administered 2021-12-21: 10 mL

## 2021-12-21 MED ORDER — TRAMADOL HCL 50 MG PO TABS: 50.0000 mg | ORAL_TABLET | Freq: Four times a day (QID) | ORAL | 0 refills | Status: DC | PRN

## 2021-12-21 MED ORDER — BUPIVACAINE-EPINEPHRINE (PF) 0.25% -1:200000 IJ SOLN
INTRAMUSCULAR | Status: AC
  Filled 2021-12-21: qty 30

## 2021-12-21 MED ORDER — 0.9 % SODIUM CHLORIDE (POUR BTL) OPTIME
TOPICAL | Status: DC | PRN
  Administered 2021-12-21: 1000 mL

## 2021-12-21 MED ORDER — MIDAZOLAM HCL 2 MG/2ML IJ SOLN
INTRAMUSCULAR | Status: AC
  Filled 2021-12-21: qty 2

## 2021-12-21 MED ORDER — OXYCODONE HCL 5 MG/5ML PO SOLN: 5.0000 mg | Freq: Once | ORAL | Status: AC | PRN

## 2021-12-21 MED ORDER — CHLORHEXIDINE GLUCONATE 0.12 % MT SOLN
15.0000 mL | Freq: Once | OROMUCOSAL | Status: AC
  Administered 2021-12-21: 15 mL via OROMUCOSAL
  Filled 2021-12-21: qty 15

## 2021-12-21 MED ORDER — PROMETHAZINE HCL 25 MG/ML IJ SOLN: 6.2500 mg | INTRAMUSCULAR | Status: DC | PRN

## 2021-12-21 MED ORDER — ONDANSETRON HCL 4 MG/2ML IJ SOLN
INTRAMUSCULAR | Status: DC | PRN
  Administered 2021-12-21: 4 mg via INTRAVENOUS

## 2021-12-21 MED ORDER — LIDOCAINE 2% (20 MG/ML) 5 ML SYRINGE
INTRAMUSCULAR | Status: DC | PRN
  Administered 2021-12-21: 60 mg via INTRAVENOUS

## 2021-12-21 MED ORDER — PROPOFOL 10 MG/ML IV BOLUS
INTRAVENOUS | Status: AC
  Filled 2021-12-21: qty 20

## 2021-12-21 MED ORDER — SCOPOLAMINE 1 MG/3DAYS TD PT72
MEDICATED_PATCH | TRANSDERMAL | Status: DC | PRN
  Administered 2021-12-21: 1 via TRANSDERMAL

## 2021-12-21 MED ORDER — PHENYLEPHRINE 80 MCG/ML (10ML) SYRINGE FOR IV PUSH (FOR BLOOD PRESSURE SUPPORT)
PREFILLED_SYRINGE | INTRAVENOUS | Status: AC
  Filled 2021-12-21: qty 30

## 2021-12-21 MED ORDER — DEXAMETHASONE SODIUM PHOSPHATE 10 MG/ML IJ SOLN
INTRAMUSCULAR | Status: AC
  Filled 2021-12-21: qty 1

## 2021-12-21 MED ORDER — ACETAMINOPHEN 500 MG PO TABS
1000.0000 mg | ORAL_TABLET | ORAL | Status: AC
  Administered 2021-12-21: 1000 mg via ORAL

## 2021-12-21 MED ORDER — ACETAMINOPHEN 500 MG PO TABS
1000.0000 mg | ORAL_TABLET | Freq: Once | ORAL | Status: AC
  Filled 2021-12-21: qty 2

## 2021-12-21 MED ORDER — ORAL CARE MOUTH RINSE: 15.0000 mL | Freq: Once | OROMUCOSAL | Status: AC

## 2021-12-21 MED ORDER — OXYCODONE HCL 5 MG PO TABS
ORAL_TABLET | ORAL | Status: AC
  Administered 2021-12-21: 5 mg via ORAL
  Filled 2021-12-21: qty 1

## 2021-12-21 MED ORDER — FENTANYL CITRATE (PF) 250 MCG/5ML IJ SOLN
INTRAMUSCULAR | Status: DC | PRN
  Administered 2021-12-21 (×2): 50 ug via INTRAVENOUS

## 2021-12-21 MED ORDER — PHENYLEPHRINE 80 MCG/ML (10ML) SYRINGE FOR IV PUSH (FOR BLOOD PRESSURE SUPPORT)
PREFILLED_SYRINGE | INTRAVENOUS | Status: DC | PRN
  Administered 2021-12-21: 80 ug via INTRAVENOUS
  Administered 2021-12-21: 120 ug via INTRAVENOUS

## 2021-12-21 MED ORDER — ONDANSETRON HCL 4 MG/2ML IJ SOLN
INTRAMUSCULAR | Status: AC
  Filled 2021-12-21: qty 2

## 2021-12-21 MED ORDER — FENTANYL CITRATE (PF) 250 MCG/5ML IJ SOLN
INTRAMUSCULAR | Status: AC
  Filled 2021-12-21: qty 5

## 2021-12-21 MED ORDER — FENTANYL CITRATE (PF) 100 MCG/2ML IJ SOLN
INTRAMUSCULAR | Status: AC
  Filled 2021-12-21: qty 2

## 2021-12-21 MED ORDER — CEFAZOLIN SODIUM-DEXTROSE 1-4 GM/50ML-% IV SOLN
INTRAVENOUS | Status: DC | PRN
  Administered 2021-12-21: 1 g via INTRAVENOUS

## 2021-12-21 SURGICAL SUPPLY — 40 items
APL PRP STRL LF DISP 70% ISPRP (MISCELLANEOUS) ×1
APL SKNCLS STERI-STRIP NONHPOA (GAUZE/BANDAGES/DRESSINGS) ×1
APPLIER CLIP 9.375 MED OPEN (MISCELLANEOUS) ×1
APR CLP MED 9.3 20 MLT OPN (MISCELLANEOUS) ×1
BENZOIN TINCTURE PRP APPL 2/3 (GAUZE/BANDAGES/DRESSINGS) ×2 IMPLANT
BINDER BREAST LRG (GAUZE/BANDAGES/DRESSINGS) IMPLANT
BINDER BREAST XLRG (GAUZE/BANDAGES/DRESSINGS) IMPLANT
CANISTER SUCT 3000ML PPV (MISCELLANEOUS) ×2 IMPLANT
CHLORAPREP W/TINT 26 (MISCELLANEOUS) ×2 IMPLANT
CLIP APPLIE 9.375 MED OPEN (MISCELLANEOUS) IMPLANT
COVER PROBE W GEL 5X96 (DRAPES) ×2 IMPLANT
COVER SURGICAL LIGHT HANDLE (MISCELLANEOUS) ×2 IMPLANT
DEVICE DUBIN SPECIMEN MAMMOGRA (MISCELLANEOUS) ×2 IMPLANT
DRAPE CHEST BREAST 15X10 FENES (DRAPES) ×2 IMPLANT
DRSG TEGADERM 4X4.75 (GAUZE/BANDAGES/DRESSINGS) ×2 IMPLANT
ELECT CAUTERY BLADE 6.4 (BLADE) ×2 IMPLANT
ELECT REM PT RETURN 9FT ADLT (ELECTROSURGICAL) ×1
ELECTRODE REM PT RTRN 9FT ADLT (ELECTROSURGICAL) ×2 IMPLANT
GAUZE SPONGE 2X2 8PLY STRL LF (GAUZE/BANDAGES/DRESSINGS) ×2 IMPLANT
GLOVE BIO SURGEON STRL SZ7 (GLOVE) ×2 IMPLANT
GLOVE BIOGEL PI IND STRL 7.5 (GLOVE) ×2 IMPLANT
GOWN STRL REUS W/ TWL LRG LVL3 (GOWN DISPOSABLE) ×4 IMPLANT
GOWN STRL REUS W/TWL LRG LVL3 (GOWN DISPOSABLE) ×2
ILLUMINATOR WAVEGUIDE N/F (MISCELLANEOUS) IMPLANT
KIT BASIN OR (CUSTOM PROCEDURE TRAY) ×2 IMPLANT
KIT MARKER MARGIN INK (KITS) ×2 IMPLANT
LIGHT WAVEGUIDE WIDE FLAT (MISCELLANEOUS) IMPLANT
NDL HYPO 25GX1X1/2 BEV (NEEDLE) ×2 IMPLANT
NEEDLE HYPO 25GX1X1/2 BEV (NEEDLE) ×1 IMPLANT
NS IRRIG 1000ML POUR BTL (IV SOLUTION) ×2 IMPLANT
PACK GENERAL/GYN (CUSTOM PROCEDURE TRAY) ×2 IMPLANT
SPONGE T-LAP 4X18 ~~LOC~~+RFID (SPONGE) ×2 IMPLANT
STRIP CLOSURE SKIN 1/2X4 (GAUZE/BANDAGES/DRESSINGS) ×2 IMPLANT
SUT MNCRL AB 4-0 PS2 18 (SUTURE) ×2 IMPLANT
SUT SILK 2 0 SH (SUTURE) IMPLANT
SUT VIC AB 3-0 SH 27 (SUTURE) ×1
SUT VIC AB 3-0 SH 27X BRD (SUTURE) ×2 IMPLANT
SYR CONTROL 10ML LL (SYRINGE) ×2 IMPLANT
TOWEL GREEN STERILE (TOWEL DISPOSABLE) ×2 IMPLANT
TOWEL GREEN STERILE FF (TOWEL DISPOSABLE) ×2 IMPLANT

## 2021-12-21 NOTE — Op Note (Addendum)
Pre-op Diagnosis:  Right breast intraductal papillomas x 2  Post-op Diagnosis: same Procedure:  Right radioactive seed localized lumpectomy x two Surgeon:  TSUEI,MATTHEW K. Anesthesia:  GEN - LMA Indications:  This is a 46-year-old female with no family history of breast cancer but a remote history of biopsied papillomas who presents after recent screening mammogram revealed 2 masses in the right breast. She was noted to have a mass in the right breast at 9:00, 7 cm from the nipple measuring 1.9 x 0.8 x 1.7 cm. Also in the 9:00 region located 2 cm from the nipple, there are some dilated ducts. Both of these areas were biopsied. Both of these revealed intraductal papilloma with no sign of atypia. The patient was having no symptoms prior to her mammogram.   Description of procedure: The patient is brought to the operating room placed in supine position on the operating room table. After an adequate level of general anesthesia was obtained, her right breast was prepped with ChloraPrep and draped in sterile fashion. A timeout was taken to ensure the proper patient and proper procedure. We interrogated the breast with the neoprobe. We made a circumareolar incision around the lateral side of the nipple after infiltrating with 0.25% Marcaine. Dissection was carried down in the breast tissue with cautery. We used the neoprobe to guide us towards the more medial, superficial radioactive seed. We excised an area of tissue around the radioactive seed 1.5 cm in diameter. The specimen was removed and was oriented with a paint kit. Specimen mammogram showed the radioactive seed as well as the coil biopsy clip within the specimen. This was sent for pathologic examination.   We turned our attention to the deeper, more lateral seed.  We used the neoprobe to guide us towards the lateral seed. We excised an area of tissue around the radioactive seed 2 cm in diameter. The specimen was removed and was oriented with a paint kit.  Specimen mammogram showed the radioactive seed as well as the ribbon biopsy clip within the specimen. This was sent for pathologic examination.   There is no residual radioactivity within the biopsy cavity.  Some of the medial margin felt firm so we excised an additional section of medial margin, which was also oriented with the paint kit.  We inspected carefully for hemostasis. The wound was thoroughly irrigated. The wound was closed with a deep layer of 3-0 Vicryl and a subcuticular layer of 4-0 Monocryl. Benzoin Steri-Strips were applied. The patient was then extubated and brought to the recovery room in stable condition. All sponge, instrument, and needle counts are correct.  Matthew K. Tsuei, MD, FACS [facility]  General/ Trauma Surgery  12/21/2021 8:37 AM   

## 2021-12-21 NOTE — Transfer of Care (Signed)
Immediate Anesthesia Transfer of Care Note  Patient: Kim Delgado  Procedure(s) Performed: RIGHT BREAST LUMPECTOMY WITH RADIOACTIVE SEED LOCALIZATION X2 (Right: Breast)  Patient Location: PACU  Anesthesia Type:General  Level of Consciousness: awake, alert , patient cooperative, and responds to stimulation  Airway & Oxygen Therapy: Patient Spontanous Breathing and Patient connected to face mask oxygen  Post-op Assessment: Report given to RN and Post -op Vital signs reviewed and stable  Post vital signs: Reviewed and stable  Last Vitals:  Vitals Value Taken Time  BP 125/79 12/21/21 0845  Temp    Pulse 87 12/21/21 0847  Resp 14 12/21/21 0847  SpO2 100 % 12/21/21 0847  Vitals shown include unvalidated device data.  Last Pain:  Vitals:   12/21/21 0612  TempSrc: Oral  PainSc:          Complications: No notable events documented.

## 2021-12-21 NOTE — Interval H&P Note (Signed)
History and Physical Interval Note:  12/21/2021 7:00 AM  Kim Delgado  has presented today for surgery, with the diagnosis of RIGHT BREAST INTRADUCTAL PAPILLOMAS.  The various methods of treatment have been discussed with the patient and family. After consideration of risks, benefits and other options for treatment, the patient has consented to  Procedure(s): RIGHT BREAST LUMPECTOMY WITH RADIOACTIVE SEED LOCALIZATION X2 (Right) as a surgical intervention.  The patient's history has been reviewed, patient examined, no change in status, stable for surgery.  I have reviewed the patient's chart and labs.  Questions were answered to the patient's satisfaction.     [name]

## 2021-12-21 NOTE — Anesthesia Postprocedure Evaluation (Signed)
Anesthesia Post Note  Patient: Kim Delgado  Procedure(s) Performed: RIGHT BREAST LUMPECTOMY WITH RADIOACTIVE SEED LOCALIZATION X2 (Right: Breast)     Patient location during evaluation: PACU Anesthesia Type: General Level of consciousness: awake and alert Pain management: pain level controlled Vital Signs Assessment: post-procedure vital signs reviewed and stable Respiratory status: spontaneous breathing, nonlabored ventilation and respiratory function stable Cardiovascular status: stable and blood pressure returned to baseline Anesthetic complications: no   No notable events documented.  Last Vitals:  Vitals:   12/21/21 0930 12/21/21 0945  BP: 108/74 110/73  Pulse: 75 78  Resp: 13 10  Temp:  36.7 C  SpO2: 96% 93%    Last Pain:  Vitals:   12/21/21 0945  TempSrc:   PainSc: [place]

## 2021-12-21 NOTE — Discharge Instructions (Signed)
[facility] Office Phone Number [phone]  BREAST BIOPSY/ PARTIAL MASTECTOMY: POST OP INSTRUCTIONS  Always review your discharge instruction sheet given to you by the facility where your surgery was performed.  IF YOU HAVE DISABILITY OR FAMILY LEAVE FORMS, YOU MUST BRING THEM TO THE OFFICE FOR PROCESSING.  DO NOT GIVE THEM TO YOUR DOCTOR.  A prescription for pain medication may be given to you upon discharge.  Take your pain medication as prescribed, if needed.  If narcotic pain medicine is not needed, then you may take acetaminophen (Tylenol) or ibuprofen (Advil) as needed. Take your usually prescribed medications unless otherwise directed If you need a refill on your pain medication, please contact your pharmacy.  They will contact our office to request authorization.  Prescriptions will not be filled after 5pm or on week-ends. You should eat very light the first 24 hours after surgery, such as soup, crackers, pudding, etc.  Resume your normal diet the day after surgery. Most patients will experience some swelling and bruising in the breast.  Ice packs and a good support bra will help.  Swelling and bruising can take several days to resolve.  It is common to experience some constipation if taking pain medication after surgery.  Increasing fluid intake and taking a stool softener will usually help or prevent this problem from occurring.  A mild laxative (Milk of Magnesia or Miralax) should be taken according to package directions if there are no bowel movements after 48 hours. Unless discharge instructions indicate otherwise, you may remove your bandages 24-48 hours after surgery, and you may shower at that time.  You may have steri-strips (small skin tapes) in place directly over the incision.  These strips should be left on the skin for 7-10 days.  If your surgeon used skin glue on the incision, you may shower in 24 hours.  The glue will flake off over the next 2-3 weeks.  Any  sutures or staples will be removed at the office during your follow-up visit. ACTIVITIES:  You may resume regular daily activities (gradually increasing) beginning the next day.  Wearing a good support bra or sports bra minimizes pain and swelling.  You may have sexual intercourse when it is comfortable. You may drive when you no longer are taking prescription pain medication, you can comfortably wear a seatbelt, and you can safely maneuver your car and apply brakes. RETURN TO WORK:  ______________________________________________________________________________________ You should see your doctor in the office for a follow-up appointment approximately two weeks after your surgery.  Your doctor's nurse will typically make your follow-up appointment when she calls you with your pathology report.  Expect your pathology report 2-3 business days after your surgery.  You may call to check if you do not hear from us after three days. OTHER INSTRUCTIONS: _______________________________________________________________________________________________ _____________________________________________________________________________________________________________________________________ _____________________________________________________________________________________________________________________________________ _____________________________________________________________________________________________________________________________________  WHEN TO CALL YOUR DOCTOR: Fever over 101.0 Nausea and/or vomiting. Extreme swelling or bruising. Continued bleeding from incision. Increased pain, redness, or drainage from the incision.  The clinic staff is available to answer your questions during regular business hours.  Please don't hesitate to call and ask to speak to one of the nurses for clinical concerns.  If you have a medical emergency, go to the nearest emergency room or call 911.  A surgeon from [facility] is always on call at the hospital.  For further questions, please visit [email or web address]   

## 2021-12-22 ENCOUNTER — Encounter (HOSPITAL_COMMUNITY): Payer: Self-pay | Admitting: Surgery

## 2021-12-23 ENCOUNTER — Telehealth: Payer: Self-pay

## 2021-12-23 LAB — SURGICAL PATHOLOGY

## 2021-12-23 NOTE — Telephone Encounter (Signed)
Transition Care Management Unsuccessful Follow-up Telephone Call  Date of discharge and from where:  12/21/21 [facility]. Dx: T2DM  Attempts:  1st Attempt  Reason for unsuccessful TCM follow-up call:  Left voice message

## 2021-12-27 ENCOUNTER — Encounter: Payer: Self-pay | Admitting: Nurse Practitioner

## 2022-01-03 ENCOUNTER — Other Ambulatory Visit: Payer: Self-pay | Admitting: Nurse Practitioner

## 2022-01-03 DIAGNOSIS — R109 Unspecified abdominal pain: Secondary | ICD-10-CM

## 2022-01-05 ENCOUNTER — Telehealth: Payer: Self-pay | Admitting: Nurse Practitioner

## 2022-01-05 NOTE — Telephone Encounter (Signed)
Schedule appt for DM and hypothyroid f/up

## 2022-01-19 NOTE — Telephone Encounter (Signed)
Transition Care Management Follow-up Telephone Call Date of discharge and from where: 12/21/21 [facility]. Sx: Breast Surgery How have you been since you were released from the hospital? I'm doing better Any questions or concerns? No  Items Reviewed: Did the pt receive and understand the discharge instructions provided? Yes  Medications obtained and verified? Yes  Other? No  Any new allergies since your discharge? No  Dietary orders reviewed? No Do you have support at home? Yes    Follow up appointments reviewed:  PCP Hospital f/u appt confirmed? No  Scheduled to see n/a on n/a @ n/a. [facility] f/u appt confirmed? Yes  Scheduled to see Dr. Haywood Pao Psuei on 01/20/22 @ unknown. [facility] Are transportation arrangements needed? No  If their condition worsens, is the pt aware to call PCP or go to the Emergency Dept.? Yes Was the patient provided with contact information for the PCP's office or ED? Yes Was to pt encouraged to call back with questions or concerns? Yes

## 2022-01-21 ENCOUNTER — Encounter (HOSPITAL_COMMUNITY): Payer: Self-pay

## 2022-03-09 ENCOUNTER — Other Ambulatory Visit (INDEPENDENT_AMBULATORY_CARE_PROVIDER_SITE_OTHER): Payer: BC Managed Care – PPO

## 2022-03-09 DIAGNOSIS — E039 Hypothyroidism, unspecified: Secondary | ICD-10-CM | POA: Diagnosis not present

## 2022-03-09 DIAGNOSIS — E1165 Type 2 diabetes mellitus with hyperglycemia: Secondary | ICD-10-CM

## 2022-03-09 LAB — T4, FREE: Free T4: 1.18 ng/dL (ref 0.60–1.60)

## 2022-03-09 LAB — TSH: TSH: 2.56 u[IU]/mL (ref 0.35–5.50)

## 2022-03-09 LAB — HEMOGLOBIN A1C: Hgb A1c MFr Bld: 7.1 % — ABNORMAL HIGH (ref 4.6–6.5)

## 2022-03-09 NOTE — Progress Notes (Signed)
Pt is here for labs

## 2022-03-10 LAB — LIPID PANEL
Cholesterol: 155 mg/dL (ref 0–200)
HDL: 35.1 mg/dL — ABNORMAL LOW (ref >39.00)
LDL Cholesterol: 96 mg/dL (ref 0–99)
NonHDL: 119.52
Total CHOL/HDL Ratio: 4
Triglycerides: 120 mg/dL (ref 0.0–149.0)
VLDL: 24 mg/dL (ref 0.0–40.0)

## 2022-03-10 LAB — LIPASE: Lipase: 90 U/L — ABNORMAL HIGH (ref 11.0–59.0)

## 2022-03-11 NOTE — Progress Notes (Signed)
Abnormal: Will discuss during upcoming appt 03/22/22

## 2022-03-11 NOTE — Progress Notes (Signed)
Will discuss during upcoming appt 03/22/22

## 2022-03-22 ENCOUNTER — Encounter: Payer: Self-pay | Admitting: Nurse Practitioner

## 2022-03-22 ENCOUNTER — Ambulatory Visit: Payer: BC Managed Care – PPO | Admitting: Nurse Practitioner

## 2022-03-22 VITALS — BP 110/74 | HR 67 | Temp 98.2°F | Resp 16 | Ht 68.0 in | Wt 267.8 lb

## 2022-03-22 DIAGNOSIS — F32A Depression, unspecified: Secondary | ICD-10-CM

## 2022-03-22 DIAGNOSIS — E039 Hypothyroidism, unspecified: Secondary | ICD-10-CM

## 2022-03-22 DIAGNOSIS — R748 Abnormal levels of other serum enzymes: Secondary | ICD-10-CM

## 2022-03-22 DIAGNOSIS — F325 Major depressive disorder, single episode, in full remission: Secondary | ICD-10-CM | POA: Diagnosis not present

## 2022-03-22 DIAGNOSIS — R109 Unspecified abdominal pain: Secondary | ICD-10-CM

## 2022-03-22 DIAGNOSIS — E1165 Type 2 diabetes mellitus with hyperglycemia: Secondary | ICD-10-CM | POA: Diagnosis not present

## 2022-03-22 DIAGNOSIS — E785 Hyperlipidemia, unspecified: Secondary | ICD-10-CM

## 2022-03-22 DIAGNOSIS — E1169 Type 2 diabetes mellitus with other specified complication: Secondary | ICD-10-CM | POA: Diagnosis not present

## 2022-03-22 HISTORY — DX: Depression, unspecified: F32.A

## 2022-03-22 MED ORDER — EUTHYROX 150 MCG PO TABS: 150.0000 ug | ORAL_TABLET | Freq: Every day | ORAL | 1 refills | Status: DC

## 2022-03-22 MED ORDER — METFORMIN HCL ER 500 MG PO TB24: 1000.0000 mg | ORAL_TABLET | Freq: Two times a day (BID) | ORAL | 1 refills | Status: DC

## 2022-03-22 MED ORDER — GLIPIZIDE 5 MG PO TABS: 5.0000 mg | ORAL_TABLET | Freq: Two times a day (BID) | ORAL | 1 refills | Status: DC

## 2022-03-22 NOTE — Assessment & Plan Note (Signed)
S/p cholecystectomy Reports persistent right flank pain, worse with constipation. Up to date with colonoscopy

## 2022-03-22 NOTE — Patient Instructions (Signed)
Schedule appt with GI Maintain current medications. Start daily exercise as discussed

## 2022-03-22 NOTE — Assessment & Plan Note (Addendum)
Uncontrolled with hgbA1c at 7.1% Reports she is compliant with glipizide 10mg , and metformin 1500mg . Reports she is compliant with low carb/low fat diet Advised to schedule appt for annual eye exam  We discussed need to add [place] or jardiance or farxiga.Hgb A1c goal is 6.5% or less. She declined to add any medication at this time. She want to improve diet and add daily exercise. She agreed to f/up in 76months.

## 2022-03-22 NOTE — Assessment & Plan Note (Addendum)
Persistent elevated lipase in absence of any GI symptoms. Normal LFTs Per CT ABD/pelvic 2023: Mild hepatomegaly and diffuse hepatic steatosis.  Advised to f/up with GI Entered referral

## 2022-03-22 NOTE — Assessment & Plan Note (Signed)
Stable TSH and T4 despite use of sea moss supplement Maintain euthyrox dose Refill sent

## 2022-03-22 NOTE — Progress Notes (Signed)
Established Patient Visit  Patient: Kim Delgado   DOB: 03/05/1975   47 y.o. Female  MRN: [id number] Visit Date: 03/22/2022  Subjective:    Chief Complaint  Patient presents with   Medical Management of Chronic Issues   HPI Hyperlipidemia associated with type 2 diabetes mellitus ([place]) LDL not at goal, low HDL Declined use of statin drug at this time Advised about importance of daily exercise and heart healthy diet  Diabetes ([place]) Uncontrolled with hgbA1c at 7.1% Reports she is compliant with glipizide 10mg , and metformin 1500mg . Reports she is compliant with low carb/low fat diet Advised to schedule appt for annual eye exam  We discussed need to add [place] or jardiance or farxiga.Hgb A1c goal is 6.5% or less. She declined to add any medication at this time. She want to improve diet and add daily exercise. She agreed to f/up in 38months.  Elevated lipase Persistent elevated lipase in absence of any GI symptoms. Advised to f/up with GI Entered referral  Acute right flank pain S/p cholecystectomy Reports persistent right flank pain, worse with constipation. Up to date with colonoscopy  Acquired hypothyroidism Stable TSH and T4 despite use of sea moss supplement Maintain euthyrox dose Refill sent  Wt Readings from Last 3 Encounters:  03/22/22 267 lb 12.8 oz (121.5 kg)  12/21/21 261 lb 11.2 oz (118.7 kg)  12/15/21 261 lb 11.2 oz (118.7 kg)       03/22/2022   11:30 AM 09/28/2021   10:04 AM 03/26/2021    2:25 PM  Depression screen PHQ 2/9  Decreased Interest 0 0 0  Down, Depressed, Hopeless 0 0 0  PHQ - 2 Score 0 0 0  Altered sleeping 0 0 0  Tired, decreased energy 0 0 0  Change in appetite 0 0 0  Feeling bad or failure about yourself  0 0 0  Trouble concentrating 0 0 0  Moving slowly or fidgety/restless 0 0 0  Suicidal thoughts 0 0 0  PHQ-9 Score 0 0 0  Difficult doing work/chores Not difficult at all Not difficult at all     Reviewed medical,  surgical, and social history today  Medications: Outpatient Medications Prior to Visit  Medication Sig   BLACK CURRANT SEED OIL PO Take 2 capsules by mouth daily.   linaclotide (LINZESS) 72 MCG capsule Take 1 capsule (72 mcg total) by mouth daily before breakfast. (Patient taking differently: Take 72 mcg by mouth daily as needed (constipation).)   Multiple Vitamin (MULTIVITAMIN WITH MINERALS) TABS tablet Take 1 tablet by mouth daily.   Multiple Vitamins-Minerals (EMERGEN-C VITAMIN C PO) Take 1 packet by mouth daily as needed (immune health/support).   naproxen (NAPROSYN) 500 MG tablet Take 1 tablet (500 mg total) by mouth 2 (two) times daily as needed.   OVER THE COUNTER MEDICATION Take 2 capsules by mouth daily. Super cell food  Sea moss with bladderwrack and burdock root   sodium chloride (OCEAN) 0.65 % SOLN nasal spray Place 1 spray into both nostrils daily as needed for congestion.   tiZANidine (ZANAFLEX) 4 MG tablet Take 1 tablet (4 mg total) by mouth daily as needed for muscle spasms.   traMADol (ULTRAM) 50 MG tablet Take 1 tablet (50 mg total) by mouth every 6 (six) hours as needed.   [DISCONTINUED] EUTHYROX 150 MCG tablet Take 1 tablet (150 mcg total) by mouth daily before breakfast.   [DISCONTINUED] glipiZIDE (GLUCOTROL) 5  MG tablet Take 1 tablet (5 mg total) by mouth 2 (two) times daily with breakfast and lunch.   [DISCONTINUED] metFORMIN (GLUCOPHAGE-XR) 500 MG 24 hr tablet Take 2 tablets (1,000 mg total) by mouth 2 (two) times daily after a meal.   No facility-administered medications prior to visit.   Reviewed past medical and social history.   ROS per HPI above  Last metabolic panel Lab Results  Component Value Date   GLUCOSE 190 (H) 12/15/2021   NA 139 12/15/2021   K 4.4 12/15/2021   CL 103 12/15/2021   CO2 26 12/15/2021   BUN 8 12/15/2021   CREATININE 0.67 12/15/2021   GFRNONAA >60 12/15/2021   CALCIUM 9.1 12/15/2021   PROT 7.2 12/15/2021   ALBUMIN 4.0 12/15/2021    LABGLOB 2.8 06/10/2017   AGRATIO 1.6 06/10/2017   BILITOT 0.5 12/15/2021   ALKPHOS 72 12/15/2021   AST 17 12/15/2021   ALT 19 12/15/2021   ANIONGAP 10 12/15/2021   Last lipids Lab Results  Component Value Date   CHOL 155 03/09/2022   HDL 35.10 (L) 03/09/2022   LDLCALC 96 03/09/2022   TRIG 120.0 03/09/2022   CHOLHDL 4 03/09/2022   Last hemoglobin A1c Lab Results  Component Value Date   HGBA1C 7.1 (H) 03/09/2022   Last thyroid functions Lab Results  Component Value Date   TSH 2.56 03/09/2022   T4TOTAL 8.7 12/22/2016      Objective:  BP 110/74 (BP Location: Left Arm, Patient Position: Sitting, Cuff Size: Large)   Pulse 67   Temp 98.2 F (36.8 C) (Temporal)   Resp 16   Ht 5\' 8"  (1.727 m)   Wt 267 lb 12.8 oz (121.5 kg)   SpO2 97%   BMI 40.72 kg/m      Physical Exam Vitals and nursing note reviewed.  Cardiovascular:     Rate and Rhythm: Normal rate and regular rhythm.     Pulses: Normal pulses.     Heart sounds: Normal heart sounds.  Pulmonary:     Effort: Pulmonary effort is normal.     Breath sounds: Normal breath sounds.  Musculoskeletal:     Right lower leg: No edema.     Left lower leg: No edema.  Neurological:     Mental Status: She is alert and oriented to person, place, and time.     No results found for any visits on 03/22/22.    Assessment & Plan:    Problem List Items Addressed This Visit       Endocrine   Acquired hypothyroidism    Stable TSH and T4 despite use of sea moss supplement Maintain euthyrox dose Refill sent      Relevant Medications   EUTHYROX 150 MCG tablet   Diabetes ([place]) - Primary    Uncontrolled with hgbA1c at 7.1% Reports she is compliant with glipizide 10mg , and metformin 1500mg . Reports she is compliant with low carb/low fat diet Advised to schedule appt for annual eye exam  We discussed need to add [place] or jardiance or farxiga.Hgb A1c goal is 6.5% or less. She declined to add any medication at this time.  She want to improve diet and add daily exercise. She agreed to f/up in 80months.      Relevant Medications   glipiZIDE (GLUCOTROL) 5 MG tablet   metFORMIN (GLUCOPHAGE-XR) 500 MG 24 hr tablet   Hyperlipidemia associated with type 2 diabetes mellitus ([facility])    LDL not at goal, low HDL Declined use of statin drug  at this time Advised about importance of daily exercise and heart healthy diet      Relevant Medications   glipiZIDE (GLUCOTROL) 5 MG tablet   metFORMIN (GLUCOPHAGE-XR) 500 MG 24 hr tablet     Other   Acute right flank pain    S/p cholecystectomy Reports persistent right flank pain, worse with constipation. Up to date with colonoscopy      Depression   Elevated lipase    Persistent elevated lipase in absence of any GI symptoms. Advised to f/up with GI Entered referral      Relevant Orders   Ambulatory referral to Gastroenterology   Morbid obesity ([place])   Relevant Medications   glipiZIDE (GLUCOTROL) 5 MG tablet   metFORMIN (GLUCOPHAGE-XR) 500 MG 24 hr tablet   Return in about 3 months (around 06/22/2022) for DM, hyperlipidemia (fasting).     Wilfred Lacy, NP

## 2022-03-22 NOTE — Assessment & Plan Note (Addendum)
LDL not at goal, low HDL Declined use of statin drug at this time Advised about importance of daily exercise and heart healthy diet

## 2022-04-28 NOTE — Progress Notes (Unsigned)
04/29/2022 [name] [id number] 10/26/1975  Referring provider: Anne Ng, NP Primary GI doctor: Dr. Rhea Belton  ASSESSMENT AND PLAN:  Elevated lipase with history of RUQ AB pain CT ABP 01/2021 normal pancrease, S/p cholecystectomy 05/2022 Lipase not 3 times upper limit of normal, on CT abdomen pelvis 01/23 normal pancreas, doubt this represents true pancreatic inflammation Not on any medications that should contribute to elevated lipase, other than right upper quadrant which did not resolve with cholecystectomy patient denies nausea, vomiting, heartburn. AB pain worse with constipation, slightly better with Linzess  -Will check lipase and amylase, if amylase is elevated can consider isoenzymes -Will check for H. pylori to evaluate for possible duodenitis, discussed potentially getting upper endoscopy to evaluate for other causes of lipase and right upper quadrant pain but patient declined at this time. -Discussed potentially repeating imaging but no significant abdominal pain on exam, pending labs may repeat cross-sectional imaging -Question possible intestinal/IBS component, will start on IBgard and given -Levsin to take as needed.  FODMAP diet discussed. -Will discuss any further recommendations with Dr. Rhea Belton  Chronic idiopathic constipation Colonoscopy recall 2028 Takes Linzess as needed, from description stool is soft and not hard.  Much more likely to be from pelvic floor dysfunction.  Patient also having urinary issues at this time. Will refer to pelvic floor physical therapy for constipation and urinary issues Continue Linzess as needed  Rectal bleeding with history of internal hemorrhoids status post banding a year ago x 2 with Dr. Rhea Belton -Sitz baths, increase fiber, increase water -Hydrocortisone supp given and external cream sent in.  -Has OV with Dr. Rhea Belton July for banding evaluation   Patient Care Team: Nche, Bonna Gains, NP as PCP - General (Internal  Medicine)  HISTORY OF PRESENT ILLNESS: 47 y.o. female with a past medical history of hyperlipidemia, type 2 diabetes, hypothyroidism, morbid obesity, chronic idiopathic constipation with hemorrhoids, history of sessile serrated colon polyps, status postcholecystectomy and hysterectomy with oophorectomy due to history of endometriosis with history of a lot of adhesions and had some on her intestines at that time and others listed below presents for evaluation of elevated lipase.   02/2021 Colonoscopy  01/10/2021 CT abdomen pelvis showed liver mildly enlarged with fatty infiltration, no liver masses, 2 small dependent gallstones.  Unremarkable pancreas with no pancreatic ductal dilatation. 03/24/2021, 05/06/21 banding of internal hemorrhoids with Dr. Rhea Belton. At previous banding 05/06/2021 patient was having elevated lipase and gallstones, epigastric pain underwent laparoscopic cholecystectomy with IOC and ICG dye 05/18/2021 with Dr. Andrey Campanile at [facility].  01/09/2021 lipase 65, 03/26/2021 lipase 145, 05/14/2021 lipase 73, underwent laparoscopic cholecystectomy 05/18/2021, 06/26/2021 lipase 60 03/09/2022 most recent lipase 90. Patient has been diabetic hemoglobin as high as 10 06/11/2020, most recently 03/09/2022 at 7.1.  Pain was severe RUQ in Jan after a sip of wine and that is when she had the CT that showed hepatomegaly, fatty infiltration and small gallstones, normal gallstones.   She states she continues to have RUQ AB pain despite having cholecystectomy, lesser extent.  She can have burning sensation RUQ and if it is really bad can be down her left arm and into her pinky, will take ibuprofen and tramadol at that time.   She states the pain is worse with constipation, better with linzess.   She has BM 2-3 x a day, after meals, she has to drink coffee to have BM. Will take an hour on the toliet prior to having BM. Has soft stools when they do  pass. She has had 4 kids, has had urinary frequency, felt she had to go again.   Stopped metamucil due to increasing gas/noise.   Has had increasing rectal bleeding, BRB, no rectal pain.  Pain can last for minutes to hours.  No other associated symptoms.  She can have GERD with greasy foods, rare nausea with pain, no vomiting.  No trouble swallowing, no acid into throat.  No weight loss.  Patient denies family history of colon cancer or other gastrointestinal malignancies.  She denies blood thinner use.  She reports NSAID use, as needed for AB pain.  She denies ETOH use.   She denies tobacco use.  She denies drug use.    She  reports that she quit smoking about 10 years ago. Her smoking use included cigarettes. She smoked an average of .3 packs per day. She has never used smokeless tobacco. She reports that she does not drink alcohol and does not use drugs.  RELEVANT LABS AND IMAGING: CBC    Component Value Date/Time   WBC 6.2 12/15/2021 0853   RBC 4.47 12/15/2021 0853   HGB 12.7 12/15/2021 0853   HGB 12.2 06/10/2017 0815   HCT 40.0 12/15/2021 0853   HCT 38.4 06/10/2017 0815   PLT 357 12/15/2021 0853   PLT 366 06/10/2017 0815   MCV 89.5 12/15/2021 0853   MCV 85.2 02/04/2018 1101   MCV 87 06/10/2017 0815   MCH 28.4 12/15/2021 0853   MCHC 31.8 12/15/2021 0853   RDW 13.4 12/15/2021 0853   RDW 13.9 06/10/2017 0815   LYMPHSABS 2.7 05/14/2021 0828   LYMPHSABS 3.3 (H) 06/10/2017 0815   MONOABS 0.3 05/14/2021 0828   EOSABS 0.7 (H) 05/14/2021 0828   EOSABS 0.3 06/10/2017 0815   BASOSABS 0.0 05/14/2021 0828   BASOSABS 0.0 06/10/2017 0815   Recent Labs    05/14/21 0828 12/15/21 0853  HGB 12.9 12.7    CMP     Component Value Date/Time   NA 139 12/15/2021 0853   NA 139 06/10/2017 0815   K 4.4 12/15/2021 0853   CL 103 12/15/2021 0853   CO2 26 12/15/2021 0853   GLUCOSE 190 (H) 12/15/2021 0853   BUN 8 12/15/2021 0853   BUN 9 06/10/2017 0815   CREATININE 0.67 12/15/2021 0853   CREATININE 0.65 09/16/2015 0853   CALCIUM 9.1 12/15/2021 0853   PROT  7.2 12/15/2021 0853   PROT 7.2 06/10/2017 0815   ALBUMIN 4.0 12/15/2021 0853   ALBUMIN 4.4 06/10/2017 0815   AST 17 12/15/2021 0853   ALT 19 12/15/2021 0853   ALKPHOS 72 12/15/2021 0853   BILITOT 0.5 12/15/2021 0853   BILITOT 0.3 06/10/2017 0815   GFRNONAA >60 12/15/2021 0853   GFRNONAA >89 09/16/2015 0853   GFRAA 129 06/10/2017 0815   GFRAA >89 09/16/2015 0853      Latest Ref Rng & Units 12/15/2021    8:53 AM 05/14/2021    8:28 AM 01/09/2021   12:17 PM  Hepatic Function  Total Protein 6.5 - 8.1 g/dL 7.2  7.2  7.3   Albumin 3.5 - 5.0 g/dL 4.0  4.0  4.5   AST 15 - 41 U/L 17  20  19    ALT 0 - 44 U/L 19  16  24    Alk Phosphatase 38 - 126 U/L 72  66  89   Total Bilirubin 0.3 - 1.2 mg/dL 0.5  0.9  0.5   Bilirubin, Direct 0.0 - 0.3 mg/dL   0.1  Current Medications:   Current Outpatient Medications (Endocrine & Metabolic):    EUTHYROX 150 MCG tablet, Take 1 tablet (150 mcg total) by mouth daily before breakfast.   glipiZIDE (GLUCOTROL) 5 MG tablet, Take 1 tablet (5 mg total) by mouth 2 (two) times daily with breakfast and lunch.   metFORMIN (GLUCOPHAGE-XR) 500 MG 24 hr tablet, Take 2 tablets (1,000 mg total) by mouth 2 (two) times daily after a meal.   Current Outpatient Medications (Respiratory):    sodium chloride (OCEAN) 0.65 % SOLN nasal spray, Place 1 spray into both nostrils daily as needed for congestion.  Current Outpatient Medications (Analgesics):    naproxen (NAPROSYN) 500 MG tablet, Take 1 tablet (500 mg total) by mouth 2 (two) times daily as needed.   traMADol (ULTRAM) 50 MG tablet, Take 1 tablet (50 mg total) by mouth every 6 (six) hours as needed.   Current Outpatient Medications (Other):    hydrocortisone (ANUSOL-HC) 2.5 % rectal cream, Place 1 Application rectally 2 (two) times daily.   hydrocortisone (ANUSOL-HC) 25 MG suppository, Place 1 suppository (25 mg total) rectally 2 (two) times daily.   hyoscyamine (LEVSIN SL) 0.125 MG SL tablet, Place 1 tablet  (0.125 mg total) under the tongue every 6 (six) hours as needed for cramping (nausea, diarrhea).   linaclotide (LINZESS) 72 MCG capsule, Take 1 capsule (72 mcg total) by mouth daily before breakfast. (Patient taking differently: Take 72 mcg by mouth daily as needed (constipation).)   Multiple Vitamins-Minerals (EMERGEN-C VITAMIN C PO), Take 1 packet by mouth daily as needed (immune health/support).   OVER THE COUNTER MEDICATION, Take 2 capsules by mouth daily. Super cell food  Sea moss with bladderwrack and burdock root   tiZANidine (ZANAFLEX) 4 MG tablet, Take 1 tablet (4 mg total) by mouth daily as needed for muscle spasms.   BLACK CURRANT SEED OIL PO, Take 2 capsules by mouth daily. (Patient not taking: Reported on 04/29/2022)  Medical History:  Past Medical History:  Diagnosis Date   Allergy    Anemia    Bronchitis with bronchospasm    Cholelithiasis    Diabetes mellitus without complication    type 2   Diverticulosis    Elevated lipase    Family history of adverse reaction to anesthesia    mother-difficult to arouse after surgery   Hepatic steatosis    Hypothyroidism    Internal hemorrhoids    rectal  bleeding that is increasing in amounts and frequency - she has 3 stools a day and has blood with each BM   Nicotine addiction 10/19/2011   Obesity    Papilloma of breast    removed   Rectal bleeding    Sessile colonic polyp 2023   serrated   Thyroid disease    Allergies:  Allergies  Allergen Reactions   Other Swelling    Walnuts   Pecan Nut (Diagnostic) Swelling     Surgical History:  She  has a past surgical history that includes Tubal ligation; Cystoscopy; Breast surgery (08/14/2010); Abdominal hysterectomy (11/10/2009); Colonoscopy (2012); Cholecystectomy (N/A, 05/18/2021); Breast biopsy (Right, 11/23/2021); Breast biopsy (Right, 11/23/2021); Breast biopsy (12/16/2021); Breast biopsy (12/16/2021); and Breast lumpectomy with radioactive seed localization (Right,  12/21/2021). Family History:  Her family history includes Breast cancer in her maternal aunt; Diabetes in her maternal grandmother and mother; Drug abuse in her father; Glaucoma in her maternal grandmother; Hypertension in her mother; Lung cancer in her paternal aunt; Stroke in her mother; Throat cancer in her maternal aunt; Transient  ischemic attack (age of onset: 30) in her mother.  REVIEW OF SYSTEMS  : All other systems reviewed and negative except where noted in the History of Present Illness.  PHYSICAL EXAM: BP 124/70 (BP Location: Left Arm, Patient Position: Sitting, Cuff Size: Large)   Pulse 88   Ht  (1.702 m) Comment: height measured without shoes  Wt 261 lb 6 oz (118.6 kg)   BMI 40.94 kg/m  General Appearance: Well nourished, in no apparent distress. Head:   Normocephalic and atraumatic. Eyes:  sclerae anicteric,conjunctive pink  Respiratory: Respiratory effort normal, BS equal bilaterally without rales, rhonchi, wheezing. Cardio: RRR with no MRGs. Peripheral pulses intact.  Abdomen: Soft,  Obese ,active bowel sounds. No tenderness . Marland Kitchen No masses. Rectal:  Musculoskeletal: Full ROM,  gait.  edema. Skin:  Dry and intact without significant lesions or rashes Neuro: Alert and  oriented x4;  No focal deficits. Psych:  Cooperative. Normal mood and affect.    Doree Albee, PA-C 12:08 PM

## 2022-04-29 ENCOUNTER — Other Ambulatory Visit (INDEPENDENT_AMBULATORY_CARE_PROVIDER_SITE_OTHER): Payer: BC Managed Care – PPO

## 2022-04-29 ENCOUNTER — Encounter: Payer: Self-pay | Admitting: Physician Assistant

## 2022-04-29 ENCOUNTER — Ambulatory Visit: Payer: BC Managed Care – PPO | Admitting: Physician Assistant

## 2022-04-29 VITALS — BP 124/70 | HR 88 | Ht 67.0 in | Wt 261.4 lb

## 2022-04-29 DIAGNOSIS — R748 Abnormal levels of other serum enzymes: Secondary | ICD-10-CM | POA: Diagnosis not present

## 2022-04-29 DIAGNOSIS — D124 Benign neoplasm of descending colon: Secondary | ICD-10-CM

## 2022-04-29 DIAGNOSIS — K648 Other hemorrhoids: Secondary | ICD-10-CM | POA: Diagnosis not present

## 2022-04-29 DIAGNOSIS — R1011 Right upper quadrant pain: Secondary | ICD-10-CM

## 2022-04-29 DIAGNOSIS — R399 Unspecified symptoms and signs involving the genitourinary system: Secondary | ICD-10-CM

## 2022-04-29 DIAGNOSIS — K5904 Chronic idiopathic constipation: Secondary | ICD-10-CM | POA: Diagnosis not present

## 2022-04-29 LAB — CBC WITH DIFFERENTIAL/PLATELET
Basophils Absolute: 0.1 10*3/uL (ref 0.0–0.1)
Basophils Relative: 1 % (ref 0.0–3.0)
Eosinophils Absolute: 0.2 10*3/uL (ref 0.0–0.7)
Eosinophils Relative: 3.4 % (ref 0.0–5.0)
HCT: 38 % (ref 36.0–46.0)
Hemoglobin: 12.5 g/dL (ref 12.0–15.0)
Lymphocytes Relative: 36.2 % (ref 12.0–46.0)
Lymphs Abs: 2.2 10*3/uL (ref 0.7–4.0)
MCHC: 32.9 g/dL (ref 30.0–36.0)
MCV: 85.7 fl (ref 78.0–100.0)
Monocytes Absolute: 0.3 10*3/uL (ref 0.1–1.0)
Monocytes Relative: 4.8 % (ref 3.0–12.0)
Neutro Abs: 3.4 10*3/uL (ref 1.4–7.7)
Neutrophils Relative %: 54.6 % (ref 43.0–77.0)
Platelets: 396 10*3/uL (ref 150.0–400.0)
RBC: 4.43 Mil/uL (ref 3.87–5.11)
RDW: 14.6 % (ref 11.5–15.5)
WBC: 6.1 10*3/uL (ref 4.0–10.5)

## 2022-04-29 LAB — COMPREHENSIVE METABOLIC PANEL
ALT: 13 U/L (ref 0–35)
AST: 14 U/L (ref 0–37)
Albumin: 4.4 g/dL (ref 3.5–5.2)
Alkaline Phosphatase: 68 U/L (ref 39–117)
BUN: 9 mg/dL (ref 6–23)
CO2: 29 mEq/L (ref 19–32)
Calcium: 9.5 mg/dL (ref 8.4–10.5)
Chloride: 102 mEq/L (ref 96–112)
Creatinine, Ser: 0.68 mg/dL (ref 0.40–1.20)
GFR: 103.44 mL/min (ref >60.00)
Glucose, Bld: 126 mg/dL — ABNORMAL HIGH (ref 70–99)
Potassium: 3.8 mEq/L (ref 3.5–5.1)
Sodium: 139 mEq/L (ref 135–145)
Total Bilirubin: 0.4 mg/dL (ref 0.2–1.2)
Total Protein: 7.8 g/dL (ref 6.0–8.3)

## 2022-04-29 LAB — LIPASE: Lipase: 43 U/L (ref 11.0–59.0)

## 2022-04-29 LAB — HEPATIC FUNCTION PANEL
ALT: 13 U/L (ref 0–35)
AST: 14 U/L (ref 0–37)
Albumin: 4.4 g/dL (ref 3.5–5.2)
Alkaline Phosphatase: 68 U/L (ref 39–117)
Bilirubin, Direct: 0.1 mg/dL (ref 0.0–0.3)
Total Bilirubin: 0.4 mg/dL (ref 0.2–1.2)
Total Protein: 7.8 g/dL (ref 6.0–8.3)

## 2022-04-29 LAB — AMYLASE: Amylase: 87 U/L (ref 27–131)

## 2022-04-29 MED ORDER — HYOSCYAMINE SULFATE 0.125 MG SL SUBL
0.1250 mg | SUBLINGUAL_TABLET | Freq: Four times a day (QID) | SUBLINGUAL | 1 refills | Status: AC | PRN
Start: 2022-04-29 — End: ?

## 2022-04-29 MED ORDER — HYDROCORTISONE ACETATE 25 MG RE SUPP: 25.0000 mg | Freq: Two times a day (BID) | RECTAL | 0 refills | Status: DC

## 2022-04-29 MED ORDER — HYDROCORTISONE (PERIANAL) 2.5 % EX CREA: 1.0000 | TOPICAL_CREAM | Freq: Two times a day (BID) | CUTANEOUS | 2 refills | Status: AC

## 2022-04-29 NOTE — Patient Instructions (Addendum)
_______________________________________________________  If your blood pressure at your visit was 140/90 or greater, please contact your primary care physician to follow up on this.  _______________________________________________________  If you are age 48 or older, your body mass index should be between 23-30. Your Body mass index is 40.94 kg/m. If this is out of the aforementioned range listed, please consider follow up with your Primary Care Provider.  If you are age 58 or younger, your body mass index should be between 19-25. Your Body mass index is 40.94 kg/m. If this is out of the aformentioned range listed, please consider follow up with your Primary Care Provider.   ________________________________________________________  The [facility] providers would like to encourage you to use [facility] to communicate with providers for non-urgent requests or questions.  Due to long hold times on the telephone, sending your provider a message by [facility] may be a faster and more efficient way to get a response.  Please allow 48 business hours for a response.  Please remember that this is for non-urgent requests.  _______________________________________________________  Kim Delgado have been scheduled for an appointment with Dr. Rhea Belton on 07-20-2022 at 4pm . Please arrive 10 minutes early for your appointment.  Your provider has requested that you go to the basement level for lab work before leaving today. Press "B" on the elevator. The lab is located at the first door on the left as you exit the elevator.  We have sent the following medications to your pharmacy for you to pick up at your convenience: Levsin Anusol supp and cream  Please purchase the following medications over the counter and take as directed: IBguard  First do a trial off milk/lactose products if you use them.  Can do trial of IBGard which is over the counter for AB pain- Take 1-2 capsules once a day for maintence or twice a day during  a flare Can send in an anti spasm medication, Levsin, to take as needed   Here some information about pelvic floor dysfunction. This may be contributing to some of your symptoms.  If the hemorrhoid suppository sent in is too expensive you can do this over the counter trick.  Apply a pea size amount of over the counter Anusol HC cream to the tip of an over the counter PrepH suppository and insert rectally once every night for at least 7 nights.    Toileting tips to help with your constipation - Drink at least 64-80 ounces of water/liquid per day. - Establish a time to try to move your bowels every day.  For many people, this is after a cup of coffee or after a meal such as breakfast. - Sit all of the way back on the toilet keeping your back fairly straight and while sitting up, try to rest the tops of your forearms on your upper thighs.   - Raising your feet with a step stool/squatty potty can be helpful to improve the angle that allows your stool to pass through the rectum. - Relax the rectum feeling it bulge toward the toilet water.  If you feel your rectum raising toward your body, you are contracting rather than relaxing. - Breathe in and slowly exhale. "Belly breath" by expanding your belly towards your belly button. Keep belly expanded as you gently direct pressure down and back to the anus.  A low pitched GRRR sound can assist with increasing intra-abdominal pressure.  - Repeat 3-4 times. If unsuccessful, contract the pelvic floor to restore normal tone and get off  the toilet.  Avoid excessive straining. - To reduce excessive wiping by teaching your anus to normally contract, place hands on outer aspect of knees and resist knee movement outward.  Hold 5-10 second then place hands just inside of knees and resist inward movement of knees.  Hold 5 seconds.  Repeat a few times each way.    Pelvic Floor Dysfunction, Female Pelvic floor dysfunction (PFD) is a condition that results when the  group of muscles and connective tissues that support the organs in the pelvis (pelvic floor muscles) do not work well. These muscles and their connections form a sling that supports the colon and bladder. In women, they also support the uterus. PFD causes pelvic floor muscles to be too weak, too tight, or both. In PFD, muscle movements are not coordinated. This may cause bowel or bladder problems. It may also cause pain. What are the causes? This condition may be caused by an injury to the pelvic area or by a weakening of pelvic muscles. This often results from pregnancy and childbirth or other types of strain. In many cases, the exact cause is not known. What increases the risk? The following factors may make you more likely to develop this condition: Having chronic bladder tissue inflammation (interstitial cystitis). Being an older person. Being overweight. History of radiation treatment for cancer in the pelvic region. Previous pelvic surgery, such as removal of the uterus (hysterectomy). What are the signs or symptoms? Symptoms of this condition vary and may include: Bladder symptoms, such as: Trouble starting urination and emptying the bladder. Frequent urinary tract infections. Leaking urine when coughing, laughing, or exercising (stress incontinence). Having to pass urine urgently or frequently. Pain when passing urine. Bowel symptoms, such as: Constipation. Urgent or frequent bowel movements. Incomplete bowel movements. Painful bowel movements. Leaking stool or gas. Unexplained genital or rectal pain. Genital or rectal muscle spasms. Low back pain. Other symptoms may include: A heavy, full, or aching feeling in the vagina. A bulge that protrudes into the vagina. Pain during or after sex. How is this diagnosed? This condition may be diagnosed based on: Your symptoms and medical history. A physical exam. During the exam, your health care provider may check your pelvic muscles  for tightness, spasm, pain, or weakness. This may include a rectal exam and a pelvic exam. In some cases, you may have diagnostic tests, such as: Electrical muscle function tests. Urine flow testing. X-ray tests of bowel function. Ultrasound of the pelvic organs. How is this treated? Treatment for this condition depends on the symptoms. Treatment options include: Physical therapy. This may include Kegel exercises to help relax or strengthen the pelvic floor muscles. Biofeedback. This type of therapy provides feedback on how tight your pelvic floor muscles are so that you can learn to control them. Internal or external massage therapy. A treatment that involves electrical stimulation of the pelvic floor muscles to help control pain (transcutaneous electrical nerve stimulation, or TENS). Sound wave therapy (ultrasound) to reduce muscle spasms. Medicines, such as: Muscle relaxants. Bladder control medicines. Surgery to reconstruct or support pelvic floor muscles may be an option if other treatments do not help. Follow these instructions at home: Activity Do your usual activities as told by your health care provider. Ask your health care provider if you should modify any activities. Do pelvic floor strengthening or relaxing exercises at home as told by your physical therapist. Lifestyle Maintain a healthy weight. Eat foods that are high in fiber, such as beans, whole grains, and  fresh fruits and vegetables. Limit foods that are high in fat and processed sugars, such as fried or sweet foods. Manage stress with relaxation techniques such as yoga or meditation. General instructions If you have problems with leakage: Use absorbable pads or wear padded underwear. Wash frequently with mild soap. Keep your genital and anal area as clean and dry as possible. Ask your health care provider if you should try a barrier cream to prevent skin irritation. Take warm baths to relieve pelvic muscle tension  or spasms. Take over-the-counter and prescription medicines only as told by your health care provider. Keep all follow-up visits. How is this prevented? The cause of PFD is not always known, but there are a few things you can do to reduce the risk of developing this condition, including: Staying at a healthy weight. Getting regular exercise. Managing stress. Contact a health care provider if: Your symptoms are not improving with home care. You have signs or symptoms of PFD that get worse at home. You develop new signs or symptoms. You have signs of a urinary tract infection, such as: Fever. Chills. Increased urinary frequency. A burning feeling when urinating. You have not had a bowel movement in 3 days (constipation). Summary Pelvic floor dysfunction results when the muscles and connective tissues in your pelvic floor do not work well. These muscles and their connections form a sling that supports your colon and bladder. In women, they also support the uterus. PFD may be caused by an injury to the pelvic area or by a weakening of pelvic muscles. PFD causes pelvic floor muscles to be too weak, too tight, or a combination of both. Symptoms may vary from person to person. In most cases, PFD can be treated with physical therapies and medicines. Surgery may be an option if other treatments do not help. This information is not intended to replace advice given to you by your health care provider. Make sure you discuss any questions you have with your health care provider. Document Revised: 04/30/2020 Document Reviewed: 04/30/2020 Elsevier Patient Education  2022 [employer].

## 2022-04-30 DIAGNOSIS — R1011 Right upper quadrant pain: Secondary | ICD-10-CM | POA: Diagnosis not present

## 2022-04-30 NOTE — Progress Notes (Signed)
Addendum: Reviewed and agree with assessment and management plan. Lipase noted to be normal at this office visit which is reassuring If recurrent elevated lipase or upper abdominal symptoms would have low threshold to pursue MRI with and without contrast of the abdomen plus MRCP (CT scan done in 2023 was not contrasted) Augustina Braddock, Carie Caddy, MD

## 2022-05-01 ENCOUNTER — Other Ambulatory Visit: Payer: Self-pay | Admitting: Nurse Practitioner

## 2022-05-01 DIAGNOSIS — R109 Unspecified abdominal pain: Secondary | ICD-10-CM

## 2022-05-03 LAB — HELICOBACTER PYLORI  SPECIAL ANTIGEN
MICRO NUMBER:: 14879806
SPECIMEN QUALITY: ADEQUATE

## 2022-05-05 ENCOUNTER — Other Ambulatory Visit: Payer: Self-pay

## 2022-05-30 ENCOUNTER — Other Ambulatory Visit: Payer: Self-pay

## 2022-05-30 ENCOUNTER — Emergency Department (HOSPITAL_COMMUNITY): Payer: BC Managed Care – PPO

## 2022-05-30 ENCOUNTER — Encounter (HOSPITAL_COMMUNITY): Payer: Self-pay

## 2022-05-30 ENCOUNTER — Emergency Department (HOSPITAL_COMMUNITY)
Admission: EM | Admit: 2022-05-30 | Discharge: 2022-05-30 | Disposition: A | Discharge status: Home / Self Care | Payer: BC Managed Care – PPO | Attending: Emergency Medicine | Admitting: Emergency Medicine

## 2022-05-30 DIAGNOSIS — Y92410 Unspecified street and highway as the place of occurrence of the external cause: Secondary | ICD-10-CM | POA: Diagnosis not present

## 2022-05-30 DIAGNOSIS — M25512 Pain in left shoulder: Secondary | ICD-10-CM | POA: Insufficient documentation

## 2022-05-30 DIAGNOSIS — M791 Myalgia, unspecified site: Secondary | ICD-10-CM | POA: Diagnosis not present

## 2022-05-30 DIAGNOSIS — E039 Hypothyroidism, unspecified: Secondary | ICD-10-CM | POA: Diagnosis not present

## 2022-05-30 DIAGNOSIS — M79631 Pain in right forearm: Secondary | ICD-10-CM | POA: Insufficient documentation

## 2022-05-30 DIAGNOSIS — M7918 Myalgia, other site: Secondary | ICD-10-CM

## 2022-05-30 DIAGNOSIS — Z79890 Hormone replacement therapy (postmenopausal): Secondary | ICD-10-CM | POA: Insufficient documentation

## 2022-05-30 DIAGNOSIS — Z7984 Long term (current) use of oral hypoglycemic drugs: Secondary | ICD-10-CM | POA: Insufficient documentation

## 2022-05-30 DIAGNOSIS — Z041 Encounter for examination and observation following transport accident: Secondary | ICD-10-CM | POA: Diagnosis not present

## 2022-05-30 DIAGNOSIS — M25531 Pain in right wrist: Secondary | ICD-10-CM | POA: Diagnosis not present

## 2022-05-30 DIAGNOSIS — E119 Type 2 diabetes mellitus without complications: Secondary | ICD-10-CM | POA: Insufficient documentation

## 2022-05-30 DIAGNOSIS — R079 Chest pain, unspecified: Secondary | ICD-10-CM | POA: Diagnosis not present

## 2022-05-30 DIAGNOSIS — R0781 Pleurodynia: Secondary | ICD-10-CM | POA: Insufficient documentation

## 2022-05-30 MED ORDER — KETOROLAC TROMETHAMINE 15 MG/ML IJ SOLN
15.0000 mg | Freq: Once | INTRAMUSCULAR | Status: AC
  Administered 2022-05-30: 15 mg via INTRAMUSCULAR
  Filled 2022-05-30: qty 1

## 2022-05-30 MED ORDER — ACETAMINOPHEN 500 MG PO TABS
1000.0000 mg | ORAL_TABLET | Freq: Once | ORAL | Status: AC
  Administered 2022-05-30: 1000 mg via ORAL
  Filled 2022-05-30: qty 2
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
## 2022-06-01 ENCOUNTER — Telehealth: Payer: Self-pay | Admitting: Nurse Practitioner

## 2022-06-01 NOTE — Telephone Encounter (Signed)
Pt would like for you to give her a call. Patient said she has some detailed information to give you
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
## 2022-06-01 NOTE — Telephone Encounter (Signed)
Called Pt back and scheduled her for 5/29 @ 11

## 2022-06-02 ENCOUNTER — Ambulatory Visit: Payer: BC Managed Care – PPO | Admitting: Nurse Practitioner

## 2022-06-02 ENCOUNTER — Encounter: Payer: Self-pay | Admitting: Nurse Practitioner

## 2022-06-02 ENCOUNTER — Ambulatory Visit (INDEPENDENT_AMBULATORY_CARE_PROVIDER_SITE_OTHER)
Admission: RE | Admit: 2022-06-02 | Discharge: 2022-06-02 | Disposition: A | Discharge status: Home / Self Care | Payer: BC Managed Care – PPO | Source: Ambulatory Visit | Attending: Nurse Practitioner | Admitting: Nurse Practitioner

## 2022-06-02 VITALS — BP 122/86 | HR 94 | Temp 98.3°F | Resp 16 | Ht 67.0 in | Wt 265.4 lb

## 2022-06-02 DIAGNOSIS — M545 Low back pain, unspecified: Secondary | ICD-10-CM | POA: Diagnosis not present

## 2022-06-02 DIAGNOSIS — R748 Abnormal levels of other serum enzymes: Secondary | ICD-10-CM | POA: Diagnosis not present

## 2022-06-02 DIAGNOSIS — M25551 Pain in right hip: Secondary | ICD-10-CM

## 2022-06-02 DIAGNOSIS — M5441 Lumbago with sciatica, right side: Secondary | ICD-10-CM | POA: Diagnosis not present

## 2022-06-02 LAB — HEPATIC FUNCTION PANEL
ALT: 14 U/L (ref 0–35)
AST: 12 U/L (ref 0–37)
Albumin: 4.2 g/dL (ref 3.5–5.2)
Alkaline Phosphatase: 83 U/L (ref 39–117)
Bilirubin, Direct: 0.1 mg/dL (ref 0.0–0.3)
Total Bilirubin: 0.4 mg/dL (ref 0.2–1.2)
Total Protein: 7.1 g/dL (ref 6.0–8.3)

## 2022-06-02 LAB — LIPASE: Lipase: 44 U/L (ref 11.0–59.0)
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
## 2022-06-02 NOTE — Patient Instructions (Signed)
Go to [address] for x-ray Go to lab

## 2022-06-07 ENCOUNTER — Encounter: Payer: Self-pay | Admitting: Nurse Practitioner

## 2022-06-07 DIAGNOSIS — M25551 Pain in right hip: Secondary | ICD-10-CM

## 2022-06-07 DIAGNOSIS — M5441 Lumbago with sciatica, right side: Secondary | ICD-10-CM

## 2022-06-21 ENCOUNTER — Ambulatory Visit: Payer: BC Managed Care – PPO | Admitting: Nurse Practitioner

## 2022-06-23 ENCOUNTER — Other Ambulatory Visit: Payer: Self-pay

## 2022-06-23 ENCOUNTER — Ambulatory Visit: Payer: BC Managed Care – PPO | Attending: Nurse Practitioner

## 2022-06-23 DIAGNOSIS — M25551 Pain in right hip: Secondary | ICD-10-CM | POA: Insufficient documentation

## 2022-06-23 DIAGNOSIS — M5441 Lumbago with sciatica, right side: Secondary | ICD-10-CM | POA: Diagnosis not present
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
## 2022-06-28 ENCOUNTER — Ambulatory Visit: Payer: BC Managed Care – PPO

## 2022-06-28 ENCOUNTER — Other Ambulatory Visit: Payer: Self-pay

## 2022-06-28 DIAGNOSIS — M25551 Pain in right hip: Secondary | ICD-10-CM | POA: Diagnosis not present

## 2022-06-28 DIAGNOSIS — M5441 Lumbago with sciatica, right side: Secondary | ICD-10-CM
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
## 2022-07-14 ENCOUNTER — Other Ambulatory Visit: Payer: Self-pay

## 2022-07-14 ENCOUNTER — Ambulatory Visit: Payer: BC Managed Care – PPO | Attending: Physician Assistant | Admitting: Physical Therapy

## 2022-07-14 DIAGNOSIS — R279 Unspecified lack of coordination: Secondary | ICD-10-CM | POA: Insufficient documentation

## 2022-07-14 DIAGNOSIS — R293 Abnormal posture: Secondary | ICD-10-CM | POA: Insufficient documentation

## 2022-07-14 DIAGNOSIS — M62838 Other muscle spasm: Secondary | ICD-10-CM | POA: Insufficient documentation

## 2022-07-14 DIAGNOSIS — M6281 Muscle weakness (generalized): Secondary | ICD-10-CM | POA: Insufficient documentation

## 2022-07-14 NOTE — Patient Instructions (Signed)
Urge Incontinence  Ideal urination frequency is every 2-4 wakeful hours, which equates to 5-8 times within a 24-hour period.   Urge incontinence is leakage that occurs when the bladder muscle contracts, creating a sudden need to go before getting to the bathroom.   Going too often when your bladder isn't actually full can disrupt the body's automatic signals to store and hold urine longer, which will increase urgency/frequency.  In this case, the bladder "is running the show" and strategies can be learned to retrain this pattern.   One should be able to control the first urge to urinate, at around 150mL.  The bladder can hold up to a "grande latte," or 400mL. To help you gain control, practice the Urge Drill below when urgency strikes.  This drill will help retrain your bladder signals and allow you to store and hold urine longer.  The overall goal is to stretch out your time between voids to reach a more manageable voiding schedule.    Practice your "quick flicks" often throughout the day (each waking hour) even when you don't need feel the urge to go.  This will help strengthen your pelvic floor muscles, making them more effective in controlling leakage.  Urge Drill  When you feel an urge to go, follow these steps to regain control: Stop what you are doing and be still Take one deep breath, directing your air into your abdomen Think an affirming thought, such as "I've got this." Do 5 quick flicks of your pelvic floor Walk with control to the bathroom to void, or delay voiding   Bladder Irritants  Certain foods and beverages can be irritating to the bladder.  Avoiding these irritants may decrease your symptoms of urinary urgency, frequency or bladder pain.  Even reducing your intake can help with your symptoms.  Not everyone is sensitive to all bladder irritants, so you may consider focusing on one irritant at a time, removing or reducing your intake of that irritant for 7-10 days to see if  this change helps your symptoms.  Water intake is also very important.  Below is a list of bladder irritants.  Drinks: alcohol, carbonated beverages, caffeinated beverages such as coffee and tea, drinks with artificial sweeteners, citrus juices, apple juice, tomato juice  Foods: tomatoes and tomato based foods, spicy food, sugar and artificial sweeteners, vinegar, chocolate, raw onion, apples, citrus fruits, pineapple, cranberries, tomatoes, strawberries, plums, peaches, cantaloupe  Other: acidic urine (too concentrated) - see water intake info below  Substitutes you can try that are NOT irritating to the bladder: cooked onion, pears, papayas, sun-brewed decaf teas, watermelons, non-citrus herbal teas, apricots, kava and low-acid instant drinks (Postum).    WATER INTAKE: Remember to drink lots of water (aim for fluid intake of half your body weight with 2/3 of fluids being water).  You may be limiting fluids due to fear of leakage, but this can actually worsen urgency symptoms due to highly concentrated urine.  Water helps balance the pH of your urine so it doesn't become too acidic - acidic urine is a bladder irritant!    

## 2022-07-14 NOTE — Therapy (Addendum)
 OUTPATIENT PHYSICAL THERAPY FEMALE PELVIC EVALUATION   Patient Name: Kim Delgado MRN: [id number] DOB:October 30, 1975, 47 y.o., female Today's Date: 07/14/2022  END OF SESSION:  PT End of Session - 07/14/22 0949     Visit Number 1    Date for PT Re-Evaluation 10/14/22    Authorization Type BCBS    PT Start Time 0802    PT Stop Time 0841    PT Time Calculation (min) 39 min    Activity Tolerance Patient tolerated treatment well    Behavior During Therapy [facility] for tasks assessed/performed             Past Medical History:  Diagnosis Date   Allergy    Anemia    Bronchitis with bronchospasm    Cholelithiasis    Diabetes mellitus without complication (HCC)    type 2   Diverticulosis    Elevated lipase    Family history of adverse reaction to anesthesia    mother-difficult to arouse after surgery   Hepatic steatosis    Hypothyroidism    Internal hemorrhoids    rectal  bleeding that is increasing in amounts and frequency - she has 3 stools a day and has blood with each BM   Nicotine addiction 10/19/2011   Obesity    Papilloma of breast    removed   Rectal bleeding    Sessile colonic polyp 2023   serrated   Thyroid disease    Past Surgical History:  Procedure Laterality Date   ABDOMINAL HYSTERECTOMY  11/10/2009   BSO; CERVIX INTACT; DUB; fibroids; endometriosis.  [place].   BREAST BIOPSY Right 11/23/2021   [place] RT BREAST BX W LOC DEV 1ST LESION IMG BX SPEC US GUIDE 11/23/2021 [facility]   BREAST BIOPSY Right 11/23/2021   [place] RT BREAST BX W LOC DEV EA ADD LESION IMG BX SPEC US GUIDE 11/23/2021 [facility]   BREAST BIOPSY  12/16/2021   MM RT RADIOACTIVE SEED EA ADD LESION LOC MAMMO GUIDE 12/16/2021 [facility]   BREAST BIOPSY  12/16/2021   MM RT RADIOACTIVE SEED LOC MAMMO GUIDE 12/16/2021 [facility]   BREAST LUMPECTOMY WITH RADIOACTIVE SEED LOCALIZATION Right 12/21/2021   Procedure: RIGHT BREAST LUMPECTOMY WITH RADIOACTIVE SEED LOCALIZATION X2;   Surgeon: Manus Rudd, MD;  Location: [facility] OR;  Service: General;  Laterality: Right;   BREAST SURGERY  08/14/2010   remove papilloma   CHOLECYSTECTOMY N/A 05/18/2021   Procedure: LAPAROSCOPIC CHOLECYSTECTOMY WITH INTRAOPERATIVE CHOLANGIOGRAM AND ICG DYE;  Surgeon: Gaynelle Adu, MD;  Location: [facility] OR;  Service: General;  Laterality: N/A;   COLONOSCOPY  2012   and 2023   CYSTOSCOPY     TUBAL LIGATION     Patient Active Problem List   Diagnosis Date Noted   Depression 03/22/2022   Intraductal papilloma of breast, right 12/07/2021   Chronic idiopathic constipation 09/28/2021   S/P laparoscopic cholecystectomy 06/26/2021   Dermoid cyst of forehead 04/01/2021   Elevated lipase 03/31/2021   Acute right flank pain 03/26/2021   S/P hysterectomy with oophorectomy 06/11/2020   Hx of endometriosis 06/11/2020   Acquired hypothyroidism 11/24/2017   Hyperlipidemia associated with type 2 diabetes mellitus (HCC) 09/25/2015   Sleep disorder, shift-work 08/06/2013   Morbid obesity (HCC) 08/06/2013   Diabetes (HCC) 03/16/2012   Hemorrhoids 12/23/2010    PCP: Alysia Penna, NP  REFERRING PROVIDER: Doree Albee, PA-C  REFERRING DIAG: [phone] (ICD-10-CM) - Chronic idiopathic constipation R39.9 (ICD-10-CM) - Urinary tract infection symptoms  THERAPY DIAG:  Muscle weakness (  generalized)  Abnormal posture  Unspecified lack of coordination  Other muscle spasm  Rationale for Evaluation and Treatment: Rehabilitation  ONSET DATE: 2022  SUBJECTIVE:                                                                                                                                                                                           SUBJECTIVE STATEMENT: Pt reports she feels like she needs to urinate again after urinating. Sometimes does have additional urine empty but not always. Does have the urge but sometimes nothing empties.  Fluid intake: Yes: water - 8-10 bottles per day, coffee - 3  cups in order to have bowel movements    PAIN:  Are you having pain? Yes NPRS scale: 10/10 at worst Pain location:  rt upper abdominal pain  Pain type: sometimes nagging but others sharp Pain description: intermittent   Aggravating factors: being more constipated Relieving factors: staying regular with bowel movements  PRECAUTIONS: None  WEIGHT BEARING RESTRICTIONS: No  FALLS:  Has patient fallen in last 6 months? No  LIVING ENVIRONMENT: Lives with: lives with their family Lives in: House/apartment  OCCUPATION: nurse - quality assurance   PLOF: Independent  PATIENT GOALS: to have less pain and more fully emptying with urination  PERTINENT HISTORY:  Depression, constipation, cholecystectomy , hysterectomy with oophorectomy,  endometriosis , Diabetes,  Hemorrhoids  Sexual abuse: No  BOWEL MOVEMENT: Pain with bowel movement: No - but does feel very slow and sits on toilet 30-45 mins and then goes again to empty and does this 2-3x per day Type of bowel movement:Type (Bristol Stool Scale) 4, Frequency 3x day, and Strain Yes sometimes Fully empty rectum: No Leakage: No Pads: No Fiber supplement: Yes: no   URINATION: Pain with urination: No Fully empty bladder: No Stream: Strong and Weak Urgency: Yes:   Frequency: sometimes quicker than every 2 hours, 2-3x nightly  Leakage:  does sometimes thinks she smells a little urine but doesn't have any wetness/leakage in morning Pads: No  INTERCOURSE: Pain with intercourse:  not painful  Ability to have vaginal penetration:  Yes:   Climax: not painful  Marinoff Scale: 0/3  PREGNANCY: Vaginal deliveries 4 Tearing Yes: with third one but unsure grade  C-section deliveries 0 Currently pregnant No  PROLAPSE: None   OBJECTIVE:   DIAGNOSTIC FINDINGS:    COGNITION: Overall cognitive status: Within functional limits for tasks assessed     SENSATION: Light touch: Appears intact Proprioception: Appears  intact  MUSCLE LENGTH: Bil hamstrings and adductors limited by 25%     POSTURE: rounded shoulders and forward head    LUMBARAROM/PROM:  A/PROM  A/PROM  eval  Flexion WFL  Extension WFL  Right lateral flexion Limited by 25%  Left lateral flexion Limited by 25%  Right rotation WFL  Left rotation WFL   (Blank rows = not tested)  LOWER EXTREMITY ROM:  WFL  LOWER EXTREMITY MMT:  Bil hips grossly 4/5, knees 5/5  PALPATION:   General  TTP rt upper quadrant and rt flank, fascial restrictions throughout lower and rt abdominal quadrants; tightness in lumbar spine mildly                External Perineal Exam no TTP                             Internal Pelvic Floor no TTP  Patient confirms identification and approves PT to assess internal pelvic floor and treatment Yes No emotional/communication barriers or cognitive limitation. Patient is motivated to learn. Patient understands and agrees with treatment goals and plan. PT explains patient will be examined in standing, sitting, and lying down to see how their muscles and joints work. When they are ready, they will be asked to remove their underwear so PT can examine their perineum. The patient is also given the option of providing their own chaperone as one is not provided in our facility. The patient also has the right and is explained the right to defer or refuse any part of the evaluation or treatment including the internal exam. With the patient's consent, PT will use one gloved finger to gently assess the muscles of the pelvic floor, seeing how well it contracts and relaxes and if there is muscle symmetry. After, the patient will get dressed and PT and patient will discuss exam findings and plan of care. PT and patient discuss plan of care, schedule, attendance policy and HEP activities.  PELVIC MMT:   MMT eval  Vaginal 4/5; 10s; 8 reps  Internal Anal Sphincter   External Anal Sphincter   Puborectalis   Diastasis Recti    (Blank rows = not tested)        TONE: WFL  PROLAPSE: Not seen in hooklying   TODAY'S TREATMENT:                                                                                                                              DATE:   07/14/22 EVAL Examination completed, findings reviewed, pt educated on POC, HEP, and bladder irritants, abdominal massage, urge drill. Pt motivated to participate in PT and agreeable to attempt recommendations.     PATIENT EDUCATION:  Education details: [id number] Person educated: Patient Education method: Explanation, Demonstration, Tactile cues, Verbal cues, and Handouts Education comprehension: verbalized understanding and returned demonstration  HOME EXERCISE PROGRAM: [id number]  ASSESSMENT:  CLINICAL IMPRESSION: Patient is a 47 y.o. female  who was seen today for physical therapy evaluation and treatment for chronic constipation and urinary frequency and urgency. Pt found to have slight decreased  flexibility in spine and hips, decreased core and hip strength, fascial restrictions in abdomen, TTP at rt and lower abdominal quadrants. Patient consented to internal pelvic floor assessment vaginally this date and found to have mildly decreased  strength, endurance, and coordination and no TTP. Pt given handouts and reviewed for urge drill, bladder irritants, HEP and abdominal massage. Pt would benefit from additional PT to further address deficits.    OBJECTIVE IMPAIRMENTS: decreased coordination, decreased endurance, decreased strength, impaired flexibility, improper body mechanics, and postural dysfunction.   ACTIVITY LIMITATIONS: continence  PARTICIPATION LIMITATIONS: community activity  PERSONAL FACTORS: Time since onset of injury/illness/exacerbation are also affecting patient's functional outcome.   REHAB POTENTIAL: Good  CLINICAL DECISION MAKING: Stable/uncomplicated  EVALUATION COMPLEXITY: Low   GOALS: Goals reviewed with patient?  Yes  SHORT TERM GOALS: Target date: 08/11/22  Pt to be I with HEP.  Baseline: Goal status: INITIAL  2.  Pt will have 25% less urgency due to bladder retraining and strengthening  Baseline:  Goal status: INITIAL  3.  Pt to be I with abdominal massage and urge drill for improved symptoms.  Baseline:  Goal status: INITIAL   LONG TERM GOALS: Target date: 10/14/22  Pt to be I with advanced HEP.  Baseline:  Goal status: INITIAL  2.  Pt will have 50% less urgency due to bladder retraining and strengthening  Baseline:  Goal status: INITIAL  3.  Pt to report improved time between bladder voids to at least 3 hours for improved QOL with decreased urinary frequency.   Baseline:  Goal status: INITIAL  4.  Pt will report her BMs are complete within 20 mins due to improved bowel habits and evacuation techniques.  Baseline: 45 mins 3x daily Goal status: INITIAL  5.  Pt will report no more than 4/10 pain at abdomen due to improvements in posture, strength, and muscle length and voiding habits Baseline:  Goal status: INITIAL   PLAN:  PT FREQUENCY: every other week  PT DURATION:  6 sessions  PLANNED INTERVENTIONS: Therapeutic exercises, Therapeutic activity, Neuromuscular re-education, Patient/Family education, Self Care, Joint mobilization, Aquatic Therapy, Dry Needling, Spinal mobilization, Cryotherapy, Moist heat, scar mobilization, Taping, Biofeedback, Manual therapy, and Re-evaluation  PLAN FOR NEXT SESSION: coordination of breathing and pelvic floor with exercise as needed for improved mobility of pelvic floor; core and hip strengthening, abdominal massage, manual at paraspinals/abdomen, breathing mechanics, voiding mechanics   Otelia Sergeant, PT, DPT 07/10/249:51 AM  PHYSICAL THERAPY DISCHARGE SUMMARY  Visits from Start of Care: 1  Current functional level related to goals / functional outcomes: Unable to reassess,pt only came to eval and cancelled treatments    Remaining  deficits: Unable to assess   Education / Equipment:  HEP   Patient agrees to discharge. Patient goals were not met. Patient is being discharged due to not returning since the last visit.   Otelia Sergeant, PT, DPT 03/14/2509:38 AM

## 2022-07-20 ENCOUNTER — Other Ambulatory Visit: Payer: Self-pay | Admitting: Nurse Practitioner

## 2022-07-20 ENCOUNTER — Encounter: Payer: Self-pay | Admitting: Internal Medicine

## 2022-07-20 ENCOUNTER — Ambulatory Visit: Payer: BC Managed Care – PPO | Admitting: Internal Medicine

## 2022-07-20 VITALS — BP 150/80 | HR 80 | Ht 68.0 in | Wt 270.0 lb

## 2022-07-20 DIAGNOSIS — K5904 Chronic idiopathic constipation: Secondary | ICD-10-CM | POA: Diagnosis not present

## 2022-07-20 DIAGNOSIS — R109 Unspecified abdominal pain: Secondary | ICD-10-CM

## 2022-07-20 DIAGNOSIS — K648 Other hemorrhoids: Secondary | ICD-10-CM | POA: Diagnosis not present

## 2022-07-20 MED ORDER — HYDROCORTISONE ACETATE 25 MG RE SUPP: 25.0000 mg | Freq: Two times a day (BID) | RECTAL | 0 refills | Status: AC

## 2022-07-20 MED ORDER — LINACLOTIDE 72 MCG PO CAPS
72.0000 ug | ORAL_CAPSULE | Freq: Every day | ORAL | 5 refills | Status: DC
Start: 2022-07-20 — End: 2023-07-29

## 2022-07-20 NOTE — Patient Instructions (Addendum)
We have sent the following medications to your pharmacy for you to pick up at your convenience: Anusol suppositories and linzess.  Please let us know if your bleeding persists.   _______________________________________________________  If your blood pressure at your visit was 140/90 or greater, please contact your primary care physician to follow up on this.  _______________________________________________________  If you are age 48 or older, your body mass index should be between 23-30. Your Body mass index is 41.05 kg/m. If this is out of the aforementioned range listed, please consider follow up with your Primary Care Provider.  If you are age 70 or younger, your body mass index should be between 19-25. Your Body mass index is 41.05 kg/m. If this is out of the aformentioned range listed, please consider follow up with your Primary Care Provider.   ________________________________________________________  The [facility] providers would like to encourage you to use [facility] to communicate with providers for non-urgent requests or questions.  Due to long hold times on the telephone, sending your provider a message by [facility] may be a faster and more efficient way to get a response.  Please allow 48 business hours for a response.  Please remember that this is for non-urgent requests.  _______________________________________________________

## 2022-07-21 NOTE — Progress Notes (Signed)
   Subjective:    Patient ID: Kim Delgado, female    DOB: 12-30-1975, 47 y.o.   MRN: [id number]  HPI Kim Delgado is a 47 year old female with a history of symptomatic bleeding internal hemorrhoids with hemorrhoidal banding x 1 in March 2023, history of sessile serrated colon polyps, chronic idiopathic constipation who is seen for follow-up and to consider additional hemorrhoidal banding.  She reports that she has had intermittent painless red blood with bowel movement.  Blood can be in the toilet but also on the tissue.  No discomfort whatsoever.  Constipation has been better with using Colace as needed as well as a mixture of tea, Himalayan sea salt and lemon.  She will use low-dose Linzess 72 mcg daily as a "backup".  For the most part stools are formed but not too hard.  No upper GI or hepatobiliary complaint today.  Due to some urinary frequency she has been seeing pelvic floor physical therapy but will do exercises as opposed to continue follow-up due to the expense.   Review of Systems As per HPI, otherwise negative  Current Medications, Allergies, Past Medical History, Past Surgical History, Family History and Social History were reviewed in [employer] record.     Objective:   Physical Exam BP (!) 150/80   Pulse 80   Ht 5\' 8"  (1.727 m)   Wt 270 lb (122.5 kg)   BMI 41.05 kg/m  Gen: awake, alert, NAD HEENT: anicteric  Rectal: Normal external exam, increased sphincter tone, tender with DRE but no masses palpable, sphincter length longer than average Neuro: nonfocal      Assessment & Plan:  47 year old female with a history of symptomatic bleeding internal hemorrhoids with hemorrhoidal banding x 1 in March 2023, history of sessile serrated colon polyps, chronic idiopathic constipation who is seen for follow-up and to consider additional hemorrhoidal banding.  Bleeding internal hemorrhoids --bleeding consistent with bleeding internal hemorrhoids.  She is  up-to-date with colonoscopy.  We had discussed and consented for additional hemorrhoidal banding today but she was quite anxious about this.  Both DRE and introduction of the hemorrhoid ligator caused significant discomfort and she wished to stop the banding process.  Therefore no band was placed.  No palpable fissure and given that she does not have pain with passing stool fissure is felt unlikely.  Anxiety and increase sphincter tone and length contributing to inability for rubber band placement.  I discussed that if bleeding returns we could consider lexical sigmoidoscopy in the outpatient hospital setting for additional band placement endoscopically -- Hydrocortisone suppository 25 mg nightly for 2-3 nights as needed -- If hemorrhoidal bleeding persists or is on a regular basis consider flexible sigmoidoscopy in the outpatient hospital setting for endoscopic band ligation under sedation  2.  Chronic constipation --continue stool softener and Linzess 72 mcg daily as needed  3.  History of colon polyps --surveillance colonoscopy indicated March 2028  Follow-up as needed  30 minutes total spent today including patient facing time, coordination of care, reviewing medical history/procedures/pertinent radiology studies, and documentation of the encounter.

## 2022-07-29 DIAGNOSIS — Z0279 Encounter for issue of other medical certificate: Secondary | ICD-10-CM

## 2022-08-04 ENCOUNTER — Telehealth: Payer: Self-pay

## 2022-08-04 ENCOUNTER — Encounter (INDEPENDENT_AMBULATORY_CARE_PROVIDER_SITE_OTHER): Payer: Self-pay

## 2022-08-04 NOTE — Telephone Encounter (Signed)
  CLINICAL USE BELOW THIS LINE (use X to signify action taken)  __X_ Form received and placed in providers office for signature. _X__ Form completed and faxed to [facility]  ___ Form completed & LVM to notify patient ready for pick up.  __X_ Charge sheet and copy of form in front office folder for office supervisor.

## 2022-08-18 ENCOUNTER — Ambulatory Visit: Payer: BC Managed Care – PPO | Admitting: Physical Therapy

## 2022-09-01 ENCOUNTER — Encounter: Payer: Self-pay | Admitting: Nurse Practitioner

## 2022-09-01 ENCOUNTER — Encounter: Payer: BC Managed Care – PPO | Admitting: Physical Therapy

## 2022-09-01 ENCOUNTER — Ambulatory Visit: Payer: BC Managed Care – PPO | Admitting: Nurse Practitioner

## 2022-09-01 VITALS — BP 120/86 | HR 75 | Temp 98.1°F | Resp 16 | Ht 68.0 in | Wt 269.4 lb

## 2022-09-01 DIAGNOSIS — Z7984 Long term (current) use of oral hypoglycemic drugs: Secondary | ICD-10-CM

## 2022-09-01 DIAGNOSIS — E785 Hyperlipidemia, unspecified: Secondary | ICD-10-CM

## 2022-09-01 DIAGNOSIS — E1169 Type 2 diabetes mellitus with other specified complication: Secondary | ICD-10-CM

## 2022-09-01 DIAGNOSIS — E1165 Type 2 diabetes mellitus with hyperglycemia: Secondary | ICD-10-CM

## 2022-09-01 LAB — HEMOGLOBIN A1C: Hgb A1c MFr Bld: 9.1 % — ABNORMAL HIGH (ref 4.6–6.5)

## 2022-09-01 LAB — LIPID PANEL
Cholesterol: 137 mg/dL (ref 0–200)
HDL: 32.7 mg/dL — ABNORMAL LOW (ref >39.00)
LDL Cholesterol: 76 mg/dL (ref 0–99)
NonHDL: 103.96
Total CHOL/HDL Ratio: 4
Triglycerides: 138 mg/dL (ref 0.0–149.0)
VLDL: 27.6 mg/dL (ref 0.0–40.0)

## 2022-09-01 LAB — MICROALBUMIN / CREATININE URINE RATIO
Creatinine,U: 25.3 mg/dL
Microalb Creat Ratio: 3 mg/g (ref 0.0–30.0)
Microalb, Ur: 0.8 mg/dL (ref 0.0–1.9)

## 2022-09-01 MED ORDER — EMPAGLIFLOZIN 10 MG PO TABS
10.0000 mg | ORAL_TABLET | Freq: Every day | ORAL | 1 refills | Status: DC
Start: 2022-09-01 — End: 2022-09-02

## 2022-09-01 NOTE — Patient Instructions (Signed)
Go to lab Maintain current med doses, heart healthy diet and daily exercise. Schedule nurse visit for flu vaccine

## 2022-09-01 NOTE — Progress Notes (Signed)
Established Patient Visit  Patient: Kim Delgado   DOB: [birth year]   47 y.o. Female  MRN: [id number] Visit Date: 09/01/2022  Subjective:    Chief Complaint  Patient presents with   Medical Management of Chronic Issues    Fasting  Mobile eye exam Tdap Declines flu inj   HPI Diabetes (HCC) Uncontrolled DIABETES with increased hgbA1c: 7.1 to 9.1%. Maintain low carb/low sugar diet. I added jardiance 10mg  daily. Maintain metformin and glipizide dose.Do you want a referral to a nutritionist?schedule lab appointment for repeat BMP in 66month. Schedule f/up appointment with me in 3months instead of 6months Low HDL, LDL at 76 Normal UACr F/up in 3months  Hyperlipidemia associated with type 2 diabetes mellitus (HCC) Low HDL, LDL at 76 ASCVD risk at 4.4% No statin at this time Encouraged to maintain daily exercise and DASH diet Repeat labs in 12months (fasting)   Reviewed medical, surgical, and social history today  Medications: Outpatient Medications Prior to Visit  Medication Sig   BLACK CURRANT SEED OIL PO Take 2 capsules by mouth daily.   EUTHYROX 150 MCG tablet Take 1 tablet (150 mcg total) by mouth daily before breakfast.   glipiZIDE (GLUCOTROL) 5 MG tablet Take 1 tablet (5 mg total) by mouth 2 (two) times daily with breakfast and lunch.   hydrocortisone (ANUSOL-HC) 2.5 % rectal cream Place 1 Application rectally 2 (two) times daily.   hydrocortisone (ANUSOL-HC) 25 MG suppository Place 1 suppository (25 mg total) rectally 2 (two) times daily.   hyoscyamine (LEVSIN SL) 0.125 MG SL tablet Place 1 tablet (0.125 mg total) under the tongue every 6 (six) hours as needed for cramping (nausea, diarrhea).   linaclotide (LINZESS) 72 MCG capsule Take 1 capsule (72 mcg total) by mouth daily before breakfast.   metFORMIN (GLUCOPHAGE-XR) 500 MG 24 hr tablet Take 2 tablets (1,000 mg total) by mouth 2 (two) times daily after a meal.   Multiple Vitamins-Minerals (EMERGEN-C VITAMIN C  PO) Take 1 packet by mouth daily as needed (immune health/support).   naproxen (NAPROSYN) 500 MG tablet Take 1 tablet (500 mg total) by mouth 2 (two) times daily as needed.   OVER THE COUNTER MEDICATION Take 2 capsules by mouth daily. Super cell food  Sea moss with bladderwrack and burdock root   sodium chloride (OCEAN) 0.65 % SOLN nasal spray Place 1 spray into both nostrils daily as needed for congestion.   tiZANidine (ZANAFLEX) 4 MG tablet TAKE 1 TABLET BY MOUTH ONCE DAILY AS NEEDED FOR MUSCLE SPASM   traMADol (ULTRAM) 50 MG tablet Take 1 tablet (50 mg total) by mouth every 6 (six) hours as needed.   No facility-administered medications prior to visit.   Reviewed past medical and social history.   ROS per HPI above  Last metabolic panel Lab Results  Component Value Date   GLUCOSE 126 (H) 04/29/2022   NA 139 04/29/2022   K 3.8 04/29/2022   CL 102 04/29/2022   CO2 29 04/29/2022   BUN 9 04/29/2022   CREATININE 0.68 04/29/2022   GFR 103.44 04/29/2022   CALCIUM 9.5 04/29/2022   PROT 7.1 06/02/2022   ALBUMIN 4.2 06/02/2022   LABGLOB 2.8 06/10/2017   AGRATIO 1.6 06/10/2017   BILITOT 0.4 06/02/2022   ALKPHOS 83 06/02/2022   AST 12 06/02/2022   ALT 14 06/02/2022   ANIONGAP 10 12/15/2021   Last lipids Lab Results  Component Value Date  CHOL 137 09/01/2022   HDL 32.70 (L) 09/01/2022   LDLCALC 76 09/01/2022   TRIG 138.0 09/01/2022   CHOLHDL 4 09/01/2022   Last hemoglobin A1c Lab Results  Component Value Date   HGBA1C 9.1 (H) 09/01/2022   Last thyroid functions Lab Results  Component Value Date   TSH 2.56 03/09/2022   T4TOTAL 8.7 12/22/2016      Objective:  BP 120/86 (BP Location: Left Arm, Patient Position: Sitting, Cuff Size: Large)   Pulse 75   Temp 98.1 F (36.7 C) (Temporal)   Resp 16   Ht 5\' 8"  (1.727 m)   Wt 269 lb 6.4 oz (122.2 kg)   SpO2 98%   BMI 40.96 kg/m      Physical Exam Cardiovascular:     Rate and Rhythm: Normal rate and regular rhythm.      Pulses: Normal pulses.     Heart sounds: Normal heart sounds.  Pulmonary:     Effort: Pulmonary effort is normal.     Breath sounds: Normal breath sounds.  Neurological:     Mental Status: She is alert and oriented to person, place, and time.     Results for orders placed or performed in visit on 09/01/22  Hemoglobin A1c  Result Value Ref Range   Hgb A1c MFr Bld 9.1 (H) 4.6 - 6.5 %  Lipid panel  Result Value Ref Range   Cholesterol 137 0 - 200 mg/dL   Triglycerides 629.5 0.0 - 149.0 mg/dL   HDL 28.41 (L) >32.44 mg/dL   VLDL 01.0 0.0 - 27.2 mg/dL   LDL Cholesterol 76 0 - 99 mg/dL   Total CHOL/HDL Ratio 4    NonHDL 103.96   Urine microalbumin-creatinine with uACR  Result Value Ref Range   Microalb, Ur 0.8 0.0 - 1.9 mg/dL   Creatinine,U 53.6 mg/dL   Microalb Creat Ratio 3.0 0.0 - 30.0 mg/g      Assessment & Plan:    Problem List Items Addressed This Visit     Diabetes (HCC)    Uncontrolled DIABETES with increased hgbA1c: 7.1 to 9.1%. Maintain low carb/low sugar diet. I added jardiance 10mg  daily. Maintain metformin and glipizide dose.Do you want a referral to a nutritionist?schedule lab appointment for repeat BMP in 3month. Schedule f/up appointment with me in 3months instead of 6months Low HDL, LDL at 76 Normal UACr F/up in 3months      Relevant Medications   empagliflozin (JARDIANCE) 10 MG TABS tablet   Other Relevant Orders   Hemoglobin A1c (Completed)   Urine microalbumin-creatinine with uACR (Completed)   Basic metabolic panel   Hyperlipidemia associated with type 2 diabetes mellitus (HCC) - Primary    Low HDL, LDL at 76 ASCVD risk at 4.4% No statin at this time Encouraged to maintain daily exercise and DASH diet Repeat labs in 12months (fasting)      Relevant Medications   empagliflozin (JARDIANCE) 10 MG TABS tablet   Other Relevant Orders   Lipid panel (Completed)   Return in about 3 months (around 12/02/2022) for DM, hyperlipidemia (fasting).      Alysia Penna, NP

## 2022-09-01 NOTE — Assessment & Plan Note (Signed)
Uncontrolled DIABETES with increased hgbA1c: 7.1 to 9.1%. Maintain low carb/low sugar diet. I added jardiance 10mg  daily. Maintain metformin and glipizide dose.Do you want a referral to a nutritionist?schedule lab appointment for repeat BMP in 56month. Schedule f/up appointment with me in 3months instead of 6months Low HDL, LDL at 76 Normal UACr F/up in 3months

## 2022-09-01 NOTE — Assessment & Plan Note (Signed)
Low HDL, LDL at 76 ASCVD risk at 4.4% No statin at this time Encouraged to maintain daily exercise and DASH diet Repeat labs in 12months (fasting)

## 2022-09-02 ENCOUNTER — Encounter: Payer: Self-pay | Admitting: Nurse Practitioner

## 2022-09-02 ENCOUNTER — Ambulatory Visit: Payer: BC Managed Care – PPO | Admitting: Nurse Practitioner

## 2022-09-02 DIAGNOSIS — E1169 Type 2 diabetes mellitus with other specified complication: Secondary | ICD-10-CM

## 2022-09-02 MED ORDER — TRULICITY 0.75 MG/0.5ML ~~LOC~~ SOAJ
0.7500 mg | SUBCUTANEOUS | 5 refills | Status: DC
Start: 2022-09-02 — End: 2022-10-04

## 2022-09-02 NOTE — Assessment & Plan Note (Signed)
Switched jardiance to trulicity per patient's preference. S/p cholecystectomy. Repeat CMP and lipase in 59month

## 2022-10-04 ENCOUNTER — Other Ambulatory Visit (INDEPENDENT_AMBULATORY_CARE_PROVIDER_SITE_OTHER): Payer: BC Managed Care – PPO

## 2022-10-04 ENCOUNTER — Encounter: Payer: Self-pay | Admitting: Physical Therapy

## 2022-10-04 DIAGNOSIS — E1165 Type 2 diabetes mellitus with hyperglycemia: Secondary | ICD-10-CM | POA: Diagnosis not present

## 2022-10-04 DIAGNOSIS — E1169 Type 2 diabetes mellitus with other specified complication: Secondary | ICD-10-CM

## 2022-10-04 DIAGNOSIS — R748 Abnormal levels of other serum enzymes: Secondary | ICD-10-CM

## 2022-10-04 LAB — BASIC METABOLIC PANEL
BUN: 7 mg/dL (ref 6–23)
CO2: 28 meq/L (ref 19–32)
Calcium: 9.5 mg/dL (ref 8.4–10.5)
Chloride: 100 meq/L (ref 96–112)
Creatinine, Ser: 0.67 mg/dL (ref 0.40–1.20)
GFR: 103.49 mL/min (ref >60.00)
Glucose, Bld: 130 mg/dL — ABNORMAL HIGH (ref 70–99)
Potassium: 3.8 meq/L (ref 3.5–5.1)
Sodium: 139 meq/L (ref 135–145)

## 2022-10-04 LAB — HEPATIC FUNCTION PANEL
ALT: 16 U/L (ref 0–35)
AST: 13 U/L (ref 0–37)
Albumin: 4.3 g/dL (ref 3.5–5.2)
Alkaline Phosphatase: 82 U/L (ref 39–117)
Bilirubin, Direct: 0.1 mg/dL (ref 0.0–0.3)
Total Bilirubin: 0.5 mg/dL (ref 0.2–1.2)
Total Protein: 7.1 g/dL (ref 6.0–8.3)

## 2022-10-04 LAB — LIPASE: Lipase: 202 U/L — ABNORMAL HIGH (ref 11.0–59.0)

## 2022-10-04 NOTE — Addendum Note (Signed)
Addended by: Michaela Corner on: 10/04/2022 02:29 PM   Modules accepted: Orders

## 2022-10-04 NOTE — Assessment & Plan Note (Addendum)
Stop trulicity due to elevated lipase: 44 to 202 Start jardiance

## 2022-10-04 NOTE — Assessment & Plan Note (Addendum)
Elevated lipase: 202 from 44 4months ago Normal renal and liver function Start trulicity 19month ago, advised to stop med Schedule f/up with GI Repeat lipase in 2weeks

## 2022-10-26 ENCOUNTER — Other Ambulatory Visit: Payer: Self-pay | Admitting: Nurse Practitioner

## 2022-10-26 DIAGNOSIS — E039 Hypothyroidism, unspecified: Secondary | ICD-10-CM

## 2022-10-26 DIAGNOSIS — E1165 Type 2 diabetes mellitus with hyperglycemia: Secondary | ICD-10-CM

## 2022-10-26 DIAGNOSIS — R109 Unspecified abdominal pain: Secondary | ICD-10-CM

## 2022-10-29 ENCOUNTER — Other Ambulatory Visit: Payer: Self-pay

## 2022-10-29 DIAGNOSIS — E039 Hypothyroidism, unspecified: Secondary | ICD-10-CM

## 2022-10-29 DIAGNOSIS — E1165 Type 2 diabetes mellitus with hyperglycemia: Secondary | ICD-10-CM

## 2022-10-29 MED ORDER — EUTHYROX 150 MCG PO TABS
150.0000 ug | ORAL_TABLET | Freq: Every day | ORAL | 1 refills | Status: DC
Start: 2022-10-29 — End: 2023-04-08

## 2022-10-29 MED ORDER — GLIPIZIDE 5 MG PO TABS
5.0000 mg | ORAL_TABLET | Freq: Two times a day (BID) | ORAL | 1 refills | Status: DC
Start: 2022-10-29 — End: 2023-04-08

## 2022-10-29 MED ORDER — METFORMIN HCL ER 500 MG PO TB24
1000.0000 mg | ORAL_TABLET | Freq: Two times a day (BID) | ORAL | 1 refills | Status: DC
Start: 2022-10-29 — End: 2023-04-08

## 2022-11-05 ENCOUNTER — Telehealth: Payer: Self-pay

## 2022-11-05 NOTE — Telephone Encounter (Signed)
Spoke to patient to schedule f/u appointment for RX refill request. Patient will call back to schedule.

## 2022-11-05 NOTE — Telephone Encounter (Signed)
Spoke to patient to schedule f/u appointment for RX refills request. Patient will call back to schdedule; currently at the grocery store.

## 2022-12-16 ENCOUNTER — Ambulatory Visit (INDEPENDENT_AMBULATORY_CARE_PROVIDER_SITE_OTHER): Payer: BC Managed Care – PPO | Admitting: Nurse Practitioner

## 2022-12-16 ENCOUNTER — Encounter: Payer: Self-pay | Admitting: Nurse Practitioner

## 2022-12-16 ENCOUNTER — Other Ambulatory Visit (HOSPITAL_COMMUNITY)
Admission: RE | Admit: 2022-12-16 | Discharge: 2022-12-16 | Disposition: A | Discharge status: Home / Self Care | Payer: BC Managed Care – PPO | Source: Ambulatory Visit | Attending: Nurse Practitioner | Admitting: Nurse Practitioner

## 2022-12-16 VITALS — BP 136/77 | HR 88 | Temp 98.3°F | Resp 18 | Ht 68.0 in | Wt 272.6 lb

## 2022-12-16 DIAGNOSIS — Z1159 Encounter for screening for other viral diseases: Secondary | ICD-10-CM | POA: Diagnosis not present

## 2022-12-16 DIAGNOSIS — G8929 Other chronic pain: Secondary | ICD-10-CM

## 2022-12-16 DIAGNOSIS — F5102 Adjustment insomnia: Secondary | ICD-10-CM | POA: Diagnosis not present

## 2022-12-16 DIAGNOSIS — E559 Vitamin D deficiency, unspecified: Secondary | ICD-10-CM

## 2022-12-16 DIAGNOSIS — R748 Abnormal levels of other serum enzymes: Secondary | ICD-10-CM

## 2022-12-16 DIAGNOSIS — Z0000 Encounter for general adult medical examination without abnormal findings: Secondary | ICD-10-CM | POA: Diagnosis not present

## 2022-12-16 DIAGNOSIS — R109 Unspecified abdominal pain: Secondary | ICD-10-CM

## 2022-12-16 DIAGNOSIS — Z7984 Long term (current) use of oral hypoglycemic drugs: Secondary | ICD-10-CM | POA: Diagnosis not present

## 2022-12-16 DIAGNOSIS — E1169 Type 2 diabetes mellitus with other specified complication: Secondary | ICD-10-CM

## 2022-12-16 DIAGNOSIS — Z113 Encounter for screening for infections with a predominantly sexual mode of transmission: Secondary | ICD-10-CM | POA: Diagnosis not present

## 2022-12-16 DIAGNOSIS — E039 Hypothyroidism, unspecified: Secondary | ICD-10-CM

## 2022-12-16 DIAGNOSIS — E785 Hyperlipidemia, unspecified: Secondary | ICD-10-CM

## 2022-12-16 DIAGNOSIS — Z0001 Encounter for general adult medical examination with abnormal findings: Secondary | ICD-10-CM

## 2022-12-16 DIAGNOSIS — Z1231 Encounter for screening mammogram for malignant neoplasm of breast: Secondary | ICD-10-CM

## 2022-12-16 LAB — POCT GLYCOSYLATED HEMOGLOBIN (HGB A1C): Hemoglobin A1C: 8.7 % — AB (ref 4.0–5.6)

## 2022-12-16 MED ORDER — EMPAGLIFLOZIN 10 MG PO TABS
10.0000 mg | ORAL_TABLET | Freq: Every day | ORAL | 1 refills | Status: DC
Start: 2022-12-16 — End: 2023-07-29

## 2022-12-16 MED ORDER — TIZANIDINE HCL 4 MG PO TABS
4.0000 mg | ORAL_TABLET | Freq: Every day | ORAL | 0 refills | Status: DC | PRN
Start: 2022-12-16 — End: 2023-08-22

## 2022-12-16 MED ORDER — HYDROXYZINE PAMOATE 25 MG PO CAPS
25.0000 mg | ORAL_CAPSULE | Freq: Every evening | ORAL | 0 refills | Status: AC | PRN
Start: 2022-12-16 — End: ?

## 2022-12-16 NOTE — Progress Notes (Signed)
Complete physical exam  Patient: Kim Delgado   DOB: 03-Sep-1975   47 y.o. Female  MRN: [id number] Visit Date: 12/16/2022  Subjective:    Chief Complaint  Patient presents with   Annual Exam    PT is due for eye exam; RX refill request for tramadol 50mg  and tizanidine 4mg     Kim Delgado is a 47 y.o. female who presents today for a complete physical exam. She reports consuming a general diet.  No exercise.  She generally feels fairly well. She reports sleeping poorly. She does have additional problems to discuss today.  Vision:No Dental:Yes STD Screen:Yes  BP Readings from Last 3 Encounters:  12/16/22 136/77  09/01/22 120/86  07/20/22 (!) 150/80   Wt Readings from Last 3 Encounters:  12/16/22 272 lb 9.6 oz (123.7 kg)  09/01/22 269 lb 6.4 oz (122.2 kg)  07/20/22 270 lb (122.5 kg)    Most recent fall risk assessment:    12/16/2022    1:03 PM  Fall Risk   Falls in the past year? 1  Number falls in past yr: 0  Injury with Fall? 0  Risk for fall due to : History of fall(s)  Follow up Falls evaluation completed;Education provided     Depression screen:Yes - No Depression Most recent depression screenings:    12/16/2022    1:07 PM 09/01/2022    8:14 AM  PHQ 2/9 Scores  PHQ - 2 Score 0 0  PHQ- 9 Score 0 0    HPI  Insomnia due to psychological stress Racing thoughts and restlessness related to life stressors. No improvement with melatonin.  Agreed to try vistaril 25mg  at hs prn Advised to also decrease caffeine intake and incorporate daily exercise.  Hyperlipidemia associated with type 2 diabetes mellitus (HCC) Report lipid panel Encouraged to maintain daily exercise and DASH diet  Elevated lipase Elevated lipase with trulicity. Med was discontinued Repeat lipase, amylase and CMP Advised to schedule f/up appointment with GI  Chronic right flank pain Stable, intermittent with constipation. Use of zanaflex and linzess prn. Unable to take linzess daily (causes  diarrhea)  Advised to use linzess 2-3x/week, maintain high fiber diet, and daily exercise.  Diabetes (HCC) Current use of metformin 1000mg  and glipizide 10mg  She did not resume jardiance as prescribed. Unable to use Trulicity due to elevated lipase. No neuropathy BP at goal Advised to schedule DIABETES eye exam. Repeat hgbA1c: 8.7% Advised to start jardiance Maintain metformin and glipizide dose Repeat CMP and UACr F/up in 3months   Past Medical History:  Diagnosis Date   Allergy    Anemia    Bronchitis with bronchospasm    Cholelithiasis    Diabetes mellitus without complication (HCC)    type 2   Diverticulosis    Elevated lipase    Family history of adverse reaction to anesthesia    mother-difficult to arouse after surgery   Hepatic steatosis    Hypothyroidism    Internal hemorrhoids    rectal  bleeding that is increasing in amounts and frequency - she has 3 stools a day and has blood with each BM   Nicotine addiction 10/19/2011   Obesity    Papilloma of breast    removed   Rectal bleeding    Sessile colonic polyp 2023   serrated   Thyroid disease    Past Surgical History:  Procedure Laterality Date   ABDOMINAL HYSTERECTOMY  11/10/2009   BSO; CERVIX INTACT; DUB; fibroids; endometriosis.  [place].   BREAST  BIOPSY Right 11/23/2021   [place] RT BREAST BX W LOC DEV 1ST LESION IMG BX SPEC US GUIDE 11/23/2021 [facility]   BREAST BIOPSY Right 11/23/2021   [place] RT BREAST BX W LOC DEV EA ADD LESION IMG BX SPEC US GUIDE 11/23/2021 [facility]   BREAST BIOPSY  12/16/2021   MM RT RADIOACTIVE SEED EA ADD LESION LOC MAMMO GUIDE 12/16/2021 [facility]   BREAST BIOPSY  12/16/2021   MM RT RADIOACTIVE SEED LOC MAMMO GUIDE 12/16/2021 [facility]   BREAST LUMPECTOMY WITH RADIOACTIVE SEED LOCALIZATION Right 12/21/2021   Procedure: RIGHT BREAST LUMPECTOMY WITH RADIOACTIVE SEED LOCALIZATION X2;  Surgeon: Manus Rudd, MD;  Location: [facility] OR;  Service: General;   Laterality: Right;   BREAST SURGERY  08/14/2010   remove papilloma   CHOLECYSTECTOMY N/A 05/18/2021   Procedure: LAPAROSCOPIC CHOLECYSTECTOMY WITH INTRAOPERATIVE CHOLANGIOGRAM AND ICG DYE;  Surgeon: Gaynelle Adu, MD;  Location: [facility] OR;  Service: General;  Laterality: N/A;   COLONOSCOPY  2012   and 2023   CYSTOSCOPY     TUBAL LIGATION     Social History   Socioeconomic History   Marital status: Legally Separated    Spouse name: Miguel   Number of children: 4   Years of education: College   Highest education level: Not on file  Occupational History   Occupation: LPN    Employer: [employer]    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  Met 25 years ago.  No abuse.       Children: 4 children (28, 20, 18 and 16); no grandchildren.      Lives: with husband 4 children.      Employment:  Environmental education officer at [employer];       Tobacco; quit 07/2011.  Smoked x 20 years.       Alcohol: not currently      Patient is right-handed.   Patient drinks one cup of coffee every morning.      Social Drivers of Corporate investment banker Strain: Not on file  Food Insecurity: Not on file  Transportation Needs: Not on file  Physical Activity: Not on file  Stress: Not on file  Social Connections: Not on file  Intimate Partner Violence: Not on file   Family Status  Relation Name Status   Mother  Alive       22   Father 35,HIV Deceased at age 60        AIDS; iv drug use   Sister  Alive   Sister  Alive   Brother  Alive   Brother  Alive   Mat Aunt  (Not Specified)   Mat Aunt  (Not Specified)   Emelda Brothers  (Not Specified)   MGM  Alive   MGF  Deceased   PGM  Deceased   PGF  Deceased  Neg Hx  (Not Specified)  No partnership data on file   Family History  Problem Relation Age of Onset   Transient ischemic attack Mother 55       x 2   Hypertension Mother    Diabetes Mother        diet controlled   Stroke Mother    Drug abuse Father        iv drug abuse   Breast cancer Maternal Aunt    Throat cancer Maternal Aunt    Lung cancer Paternal Aunt    Glaucoma Maternal Grandmother    Diabetes Maternal Grandmother    Colon cancer Neg Hx    Colon polyps Neg Hx    Esophageal cancer Neg Hx    Rectal cancer Neg Hx    Stomach cancer Neg Hx    Allergies  Allergen Reactions   Other Swelling    Walnuts   Pecan Nut (Diagnostic) Swelling    Patient Care Team: Jennel Mara, Bonna Gains, NP as PCP - General (Internal Medicine)   Medications: Outpatient Medications Prior to Visit  Medication Sig Note   BLACK CURRANT SEED OIL PO Take 2 capsules by mouth daily.    EUTHYROX 150 MCG tablet Take 1 tablet (150 mcg total) by mouth daily before breakfast.    glipiZIDE (GLUCOTROL) 5 MG tablet Take 1 tablet (5 mg total) by mouth 2 (two) times daily with breakfast and lunch.    hydrocortisone (ANUSOL-HC) 2.5 % rectal cream Place 1 Application rectally 2 (two) times daily.    hydrocortisone (ANUSOL-HC) 25 MG suppository Place 1 suppository (25 mg total) rectally 2 (two) times daily.    hyoscyamine (LEVSIN SL) 0.125 MG SL tablet Place 1 tablet (0.125 mg total) under the tongue every 6 (six) hours as needed for cramping (nausea, diarrhea).    linaclotide (LINZESS) 72 MCG capsule Take 1 capsule (72 mcg total) by mouth daily before breakfast.    metFORMIN (GLUCOPHAGE-XR) 500 MG 24 hr tablet Take 2 tablets (1,000 mg total) by mouth 2 (two) times daily after a  meal.    Multiple Vitamins-Minerals (EMERGEN-C VITAMIN C PO) Take 1 packet by mouth daily as needed (immune health/support).    naproxen (NAPROSYN) 500 MG tablet Take 1 tablet (500 mg total) by mouth 2 (two) times daily as needed.    OVER THE COUNTER MEDICATION Take 2 capsules by mouth daily. Super cell food  Sea moss with bladderwrack and burdock root    sodium chloride (OCEAN) 0.65 % SOLN nasal spray Place 1 spray into both nostrils daily as needed for congestion.    [DISCONTINUED] tiZANidine (ZANAFLEX) 4 MG tablet TAKE 1 TABLET BY MOUTH ONCE DAILY AS NEEDED FOR MUSCLE SPASM 12/16/2022: Refills are needed   [DISCONTINUED] traMADol (ULTRAM) 50 MG tablet Take 1 tablet (50 mg total) by mouth every 6 (six) hours as needed. 12/16/2022: Refills needed   No facility-administered medications prior to visit.    Review of Systems  Constitutional:  Negative for activity change, appetite change and unexpected weight change.  Respiratory: Negative.    Cardiovascular: Negative.   Gastrointestinal: Negative.   Endocrine: Negative for cold intolerance and heat intolerance.  Genitourinary: Negative.   Musculoskeletal: Negative.   Skin: Negative.   Neurological: Negative.   Hematological: Negative.   Psychiatric/Behavioral:  Negative for behavioral problems, decreased concentration, dysphoric mood, hallucinations, self-injury, sleep disturbance and suicidal ideas. The patient is not nervous/anxious.        Objective:  BP 136/77 (BP Location: Left Arm, Patient  Position: Sitting, Cuff Size: Large)   Pulse 88   Temp 98.3 F (36.8 C) (Temporal)   Resp 18   Ht 5\' 8"  (1.727 m)   Wt 272 lb 9.6 oz (123.7 kg)   SpO2 98%   BMI 41.45 kg/m     Physical Exam Vitals and nursing note reviewed.  Constitutional:      General: She is not in acute distress. HENT:     Right Ear: Tympanic membrane, ear canal and external ear normal.     Left Ear: Tympanic membrane, ear canal and external ear normal.      Nose: Nose normal.  Eyes:     Extraocular Movements: Extraocular movements intact.     Conjunctiva/sclera: Conjunctivae normal.     Pupils: Pupils are equal, round, and reactive to light.  Neck:     Thyroid: No thyroid mass, thyromegaly or thyroid tenderness.  Cardiovascular:     Rate and Rhythm: Normal rate and regular rhythm.     Pulses: Normal pulses.     Heart sounds: Normal heart sounds.  Pulmonary:     Effort: Pulmonary effort is normal.     Breath sounds: Normal breath sounds.  Abdominal:     General: Bowel sounds are normal.     Palpations: Abdomen is soft.  Musculoskeletal:        General: Normal range of motion.     Cervical back: Normal range of motion and neck supple.     Right lower leg: Edema present.     Left lower leg: Edema present.  Lymphadenopathy:     Cervical: No cervical adenopathy.  Skin:    General: Skin is warm and dry.  Neurological:     Mental Status: She is alert and oriented to person, place, and time.     Cranial Nerves: No cranial nerve deficit.  Psychiatric:        Mood and Affect: Mood normal.        Behavior: Behavior normal.        Thought Content: Thought content normal.     Results for orders placed or performed in visit on 12/16/22  POCT glycosylated hemoglobin (Hb A1C)  Result Value Ref Range   Hemoglobin A1C 8.7 (A) 4.0 - 5.6 %   HbA1c POC (<> result, manual entry)     HbA1c, POC (prediabetic range)     HbA1c, POC (controlled diabetic range)        Assessment & Plan:    Routine Health Maintenance and Physical Exam  Immunization History  Administered Date(s) Administered   Hepatitis B 07/20/2012   Hepatitis B, ADULT 08/19/2012, 03/17/2013   Influenza,inj,Quad PF,6+ Mos 04/01/2014, 09/16/2014, 09/16/2015, 10/06/2021   Influenza-Unspecified 10/04/2012, 10/04/2016, 10/05/2020   Janssen (J&J) SARS-COV-2 Vaccination 04/02/2019   MMR 07/20/2012   Moderna Sars-Covid-2 Vaccination 11/20/2019   Pneumococcal Polysaccharide-23  06/23/2012   Tdap 04/17/2012, 09/01/2022   Health Maintenance  Topic Date Due   Hepatitis C Screening  Never done   OPHTHALMOLOGY EXAM  07/31/2020   INFLUENZA VACCINE  04/04/2023 (Originally 08/05/2022)   COVID-19 Vaccine (3 - 2024-25 season) 04/04/2023 (Originally 09/05/2022)   HEMOGLOBIN A1C  06/16/2023   Diabetic kidney evaluation - Urine ACR  09/01/2023   Diabetic kidney evaluation - eGFR measurement  10/04/2023   FOOT EXAM  12/16/2023   Colonoscopy  03/02/2026   DTaP/Tdap/Td (3 - Td or Tdap) 08/31/2032   HIV Screening  Completed   HPV VACCINES  Aged Out   Discussed health benefits of physical  activity, and encouraged her to engage in regular exercise appropriate for her age and condition.  Problem List Items Addressed This Visit     Acquired hypothyroidism   Relevant Orders   T4, free   TSH   Chronic right flank pain   Stable, intermittent with constipation. Use of zanaflex and linzess prn. Unable to take linzess daily (causes diarrhea)  Advised to use linzess 2-3x/week, maintain high fiber diet, and daily exercise.      Relevant Medications   tiZANidine (ZANAFLEX) 4 MG tablet   Diabetes (HCC)   Current use of metformin 1000mg  and glipizide 10mg  She did not resume jardiance as prescribed. Unable to use Trulicity due to elevated lipase. No neuropathy BP at goal Advised to schedule DIABETES eye exam. Repeat hgbA1c: 8.7% Advised to start jardiance Maintain metformin and glipizide dose Repeat CMP and UACr F/up in 3months      Relevant Medications   tiZANidine (ZANAFLEX) 4 MG tablet   empagliflozin (JARDIANCE) 10 MG TABS tablet   Other Relevant Orders   POCT glycosylated hemoglobin (Hb A1C) (Completed)   Elevated lipase   Elevated lipase with trulicity. Med was discontinued Repeat lipase, amylase and CMP Advised to schedule f/up appointment with GI      Hyperlipidemia associated with type 2 diabetes mellitus (HCC)   Report lipid panel Encouraged to maintain  daily exercise and DASH diet      Relevant Medications   empagliflozin (JARDIANCE) 10 MG TABS tablet   Other Relevant Orders   Lipid panel   Insomnia due to psychological stress   Racing thoughts and restlessness related to life stressors. No improvement with melatonin.  Agreed to try vistaril 25mg  at hs prn Advised to also decrease caffeine intake and incorporate daily exercise.      Relevant Medications   hydrOXYzine (VISTARIL) 25 MG capsule   Other Visit Diagnoses       Encounter for preventative adult health care exam with abnormal findings    -  Primary   Relevant Orders   Comprehensive metabolic panel     Breast cancer screening by mammogram       Relevant Orders   MM 3D SCREENING MAMMOGRAM BILATERAL BREAST     Encounter for screening for viral disease       Relevant Orders   HIV Antibody (routine testing w rflx)   RPR   Urine cytology ancillary only   Hepatitis C antibody     Vitamin D deficiency       Relevant Orders   VITAMIN D 25 Hydroxy (Vit-D Deficiency, Fractures)     Long term (current) use of oral hypoglycemic drugs       Relevant Medications   empagliflozin (JARDIANCE) 10 MG TABS tablet   Other Relevant Orders   POCT glycosylated hemoglobin (Hb A1C) (Completed)      Return in about 3 months (around 03/16/2023) for HTN, DM, hyperlipidemia (fasting).     Alysia Penna, NP

## 2022-12-16 NOTE — Patient Instructions (Addendum)
Schedule fasting lab appt. Need to be fasting 8hrs prior to blood draw. Ok to drink water and take BP meds. maintain High fiber/low/carb/lo sodium/low fat diet diet and daily exercise. Take Linzess 2-3x/week to prevent constipation Schedule appointment for DIABETES eye exam, mammogram ([phone]), and with GI HgbA1c at 8.7% Start jardiance as prescribed

## 2022-12-16 NOTE — Assessment & Plan Note (Signed)
Report lipid panel Encouraged to maintain daily exercise and DASH diet

## 2022-12-16 NOTE — Assessment & Plan Note (Signed)
Current use of metformin 1000mg  and glipizide 10mg  She did not resume jardiance as prescribed. Unable to use Trulicity due to elevated lipase. No neuropathy BP at goal Advised to schedule DIABETES eye exam. Repeat hgbA1c: 8.7% Advised to start jardiance Maintain metformin and glipizide dose Repeat CMP and UACr F/up in 3months

## 2022-12-16 NOTE — Assessment & Plan Note (Signed)
Racing thoughts and restlessness related to life stressors. No improvement with melatonin.  Agreed to try vistaril 25mg  at hs prn Advised to also decrease caffeine intake and incorporate daily exercise.

## 2022-12-16 NOTE — Assessment & Plan Note (Signed)
Elevated lipase with trulicity. Med was discontinued Repeat lipase, amylase and CMP Advised to schedule f/up appointment with GI

## 2022-12-16 NOTE — Assessment & Plan Note (Signed)
Stable, intermittent with constipation. Use of zanaflex and linzess prn. Unable to take linzess daily (causes diarrhea)  Advised to use linzess 2-3x/week, maintain high fiber diet, and daily exercise.

## 2022-12-17 ENCOUNTER — Other Ambulatory Visit (INDEPENDENT_AMBULATORY_CARE_PROVIDER_SITE_OTHER): Payer: BC Managed Care – PPO

## 2022-12-17 DIAGNOSIS — Z1159 Encounter for screening for other viral diseases: Secondary | ICD-10-CM | POA: Diagnosis not present

## 2022-12-17 DIAGNOSIS — E785 Hyperlipidemia, unspecified: Secondary | ICD-10-CM

## 2022-12-17 DIAGNOSIS — E039 Hypothyroidism, unspecified: Secondary | ICD-10-CM | POA: Diagnosis not present

## 2022-12-17 DIAGNOSIS — E1169 Type 2 diabetes mellitus with other specified complication: Secondary | ICD-10-CM

## 2022-12-17 DIAGNOSIS — E559 Vitamin D deficiency, unspecified: Secondary | ICD-10-CM | POA: Diagnosis not present

## 2022-12-17 DIAGNOSIS — Z0001 Encounter for general adult medical examination with abnormal findings: Secondary | ICD-10-CM

## 2022-12-17 LAB — COMPREHENSIVE METABOLIC PANEL
ALT: 15 U/L (ref 0–35)
AST: 11 U/L (ref 0–37)
Albumin: 4.2 g/dL (ref 3.5–5.2)
Alkaline Phosphatase: 93 U/L (ref 39–117)
BUN: 8 mg/dL (ref 6–23)
CO2: 27 meq/L (ref 19–32)
Calcium: 8.8 mg/dL (ref 8.4–10.5)
Chloride: 102 meq/L (ref 96–112)
Creatinine, Ser: 0.6 mg/dL (ref 0.40–1.20)
GFR: 106.13 mL/min (ref >60.00)
Glucose, Bld: 217 mg/dL — ABNORMAL HIGH (ref 70–99)
Potassium: 4.1 meq/L (ref 3.5–5.1)
Sodium: 137 meq/L (ref 135–145)
Total Bilirubin: 0.4 mg/dL (ref 0.2–1.2)
Total Protein: 6.8 g/dL (ref 6.0–8.3)

## 2022-12-17 LAB — LIPID PANEL
Cholesterol: 130 mg/dL (ref 0–200)
HDL: 30.1 mg/dL — ABNORMAL LOW (ref >39.00)
LDL Cholesterol: 77 mg/dL (ref 0–99)
NonHDL: 99.78
Total CHOL/HDL Ratio: 4
Triglycerides: 112 mg/dL (ref 0.0–149.0)
VLDL: 22.4 mg/dL (ref 0.0–40.0)

## 2022-12-17 LAB — URINE CYTOLOGY ANCILLARY ONLY
Chlamydia: NEGATIVE
Comment: NEGATIVE
Comment: NEGATIVE
Comment: NORMAL
Neisseria Gonorrhea: NEGATIVE
Trichomonas: NEGATIVE

## 2022-12-17 LAB — VITAMIN D 25 HYDROXY (VIT D DEFICIENCY, FRACTURES): VITD: 29.42 ng/mL — ABNORMAL LOW (ref 30.00–100.00)

## 2022-12-17 LAB — T4, FREE: Free T4: 0.81 ng/dL (ref 0.60–1.60)

## 2022-12-17 LAB — TSH: TSH: 7.95 u[IU]/mL — ABNORMAL HIGH (ref 0.35–5.50)

## 2022-12-21 LAB — HIV ANTIBODY (ROUTINE TESTING W REFLEX): HIV 1&2 Ab, 4th Generation: NONREACTIVE

## 2022-12-21 LAB — HEPATITIS C ANTIBODY: Hepatitis C Ab: NONREACTIVE

## 2022-12-21 LAB — RPR: RPR Ser Ql: NONREACTIVE

## 2023-01-13 ENCOUNTER — Ambulatory Visit
Admission: RE | Admit: 2023-01-13 | Discharge: 2023-01-13 | Disposition: A | Discharge status: Home / Self Care | Payer: BC Managed Care – PPO | Source: Ambulatory Visit | Attending: Nurse Practitioner | Admitting: Nurse Practitioner

## 2023-01-13 DIAGNOSIS — Z1231 Encounter for screening mammogram for malignant neoplasm of breast: Secondary | ICD-10-CM

## 2023-02-21 ENCOUNTER — Telehealth: Payer: Self-pay

## 2023-02-21 NOTE — Telephone Encounter (Signed)
Patient was identified as falling into the [place] Measure - Diabetes.   Patient was: Appointment scheduled for lab or office visit for A1c.   Next PCP 04/08/23

## 2023-03-10 IMAGING — CT CT ABD-PELV W/O CM
2 of 4 series · 17 of 46 positions shown, 19 images · non-contrast
Comparison: 07/20/2009

CLINICAL DATA: Pt c/o rt lower back pain radiating down rt leg with
a burning "on fire" sensation. No injury, no urinary
changes/discoloration. No hx stones. TAH

EXAM:
CT ABDOMEN AND PELVIS WITHOUT CONTRAST
TECHNIQUE: Multidetector CT imaging of the abdomen and pelvis was performed
following the standard protocol without IV contrast.

[Series 2: axial st · axial · 0.77mm/px · z∈[+913,+1368]mm · 14 of 99 slices shown, 16 images]
[im 4/99  soft-tissue]
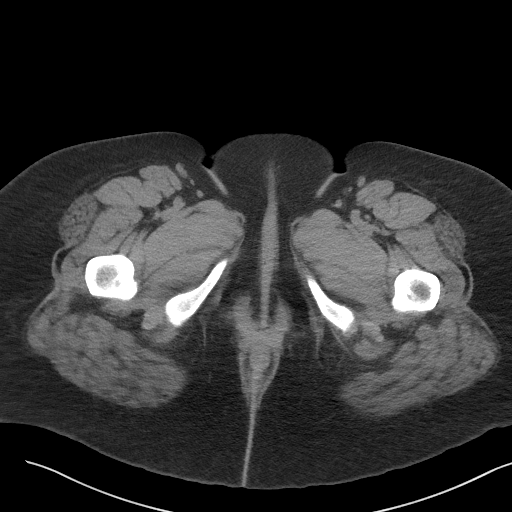
[im 4/99  bone]
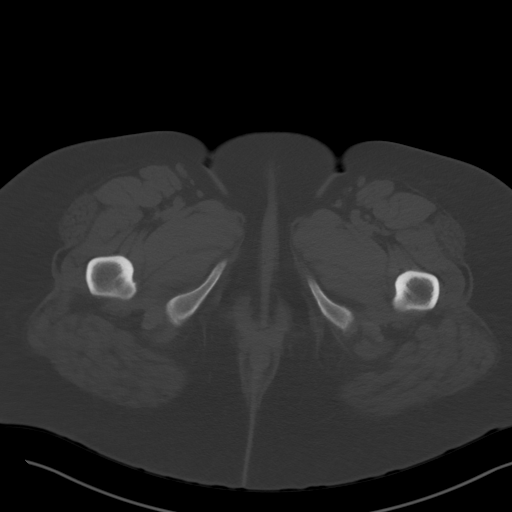
[im 12/99  soft-tissue]
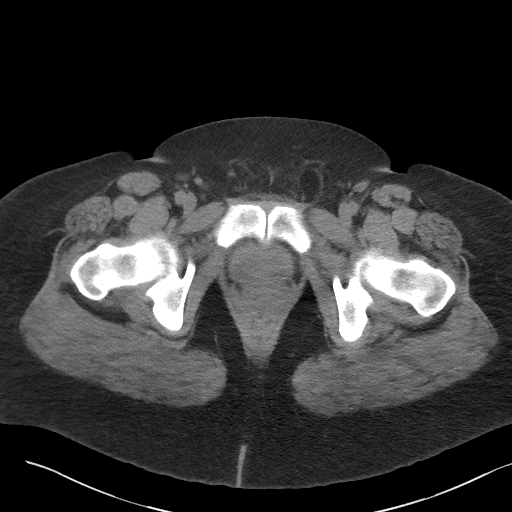
[im 19/99  soft-tissue]
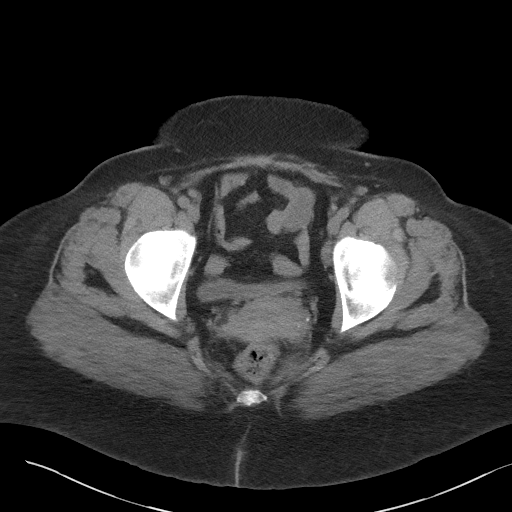
[im 27/99  soft-tissue]
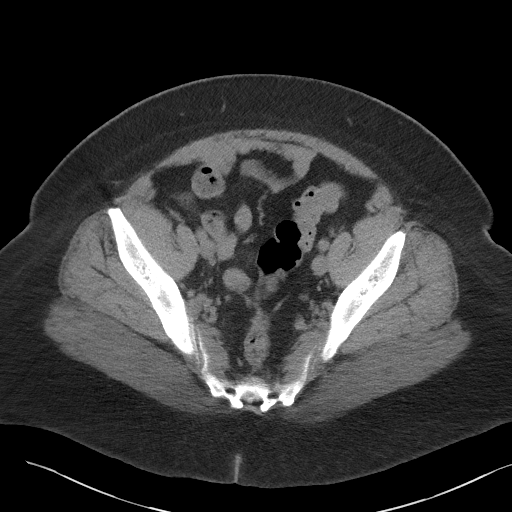
[im 34/99  soft-tissue]
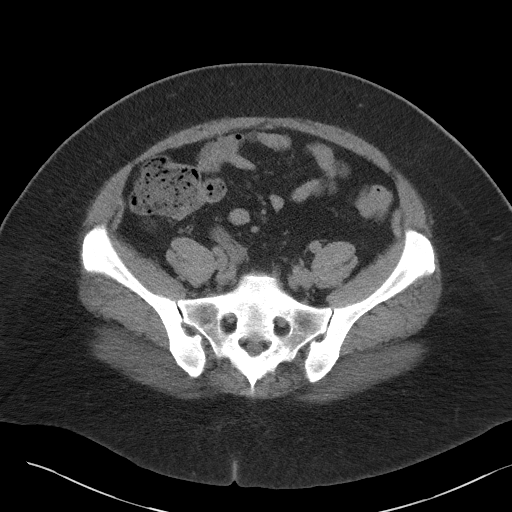
[im 38/99  soft-tissue]
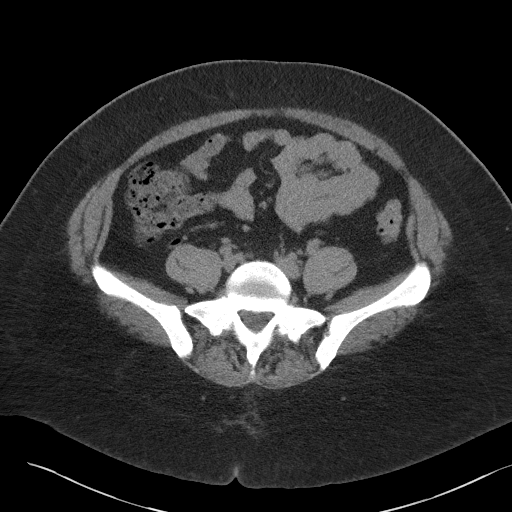
[im 46/99  soft-tissue]
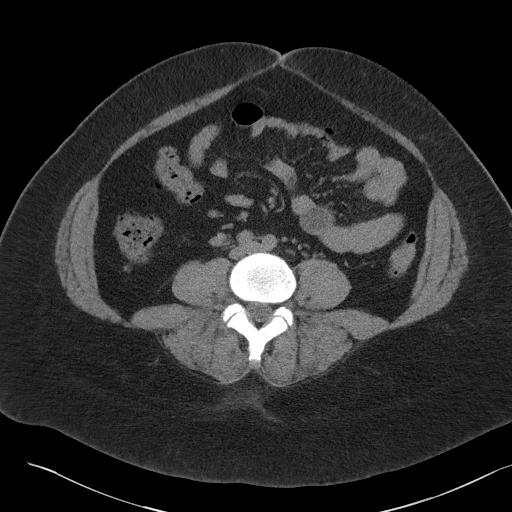
[im 53/99  soft-tissue]
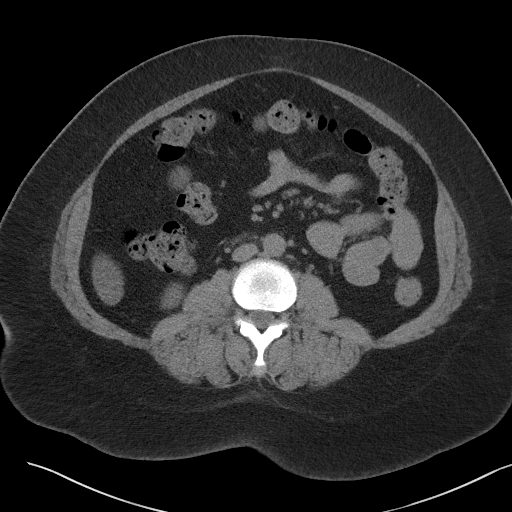
[im 61/99  soft-tissue]
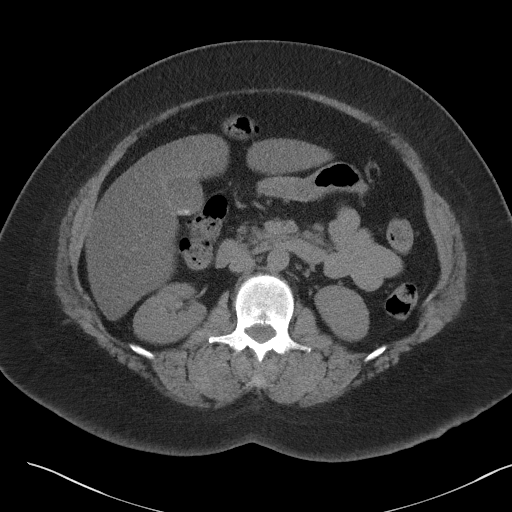
[im 61/99  bone]
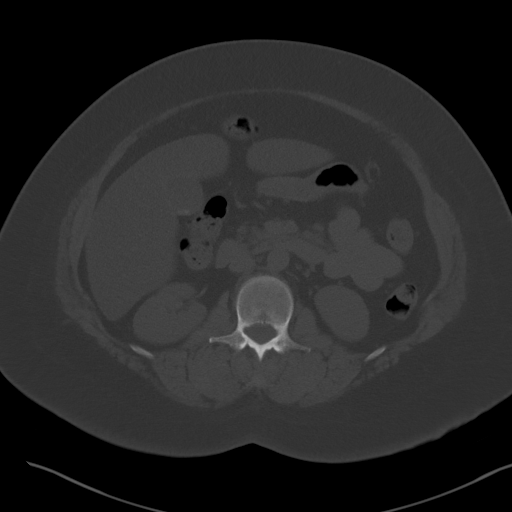
[im 65/99  soft-tissue]
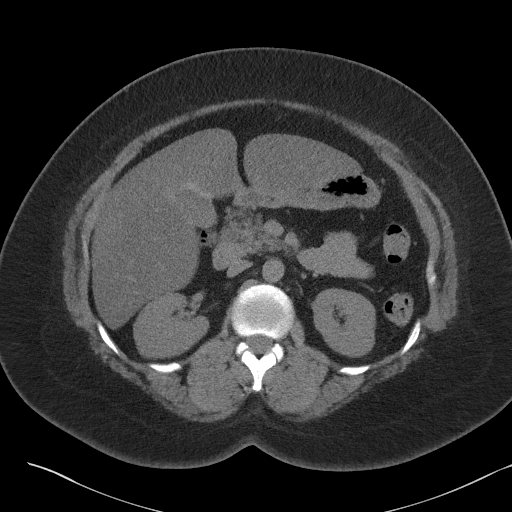
[im 72/99  soft-tissue]
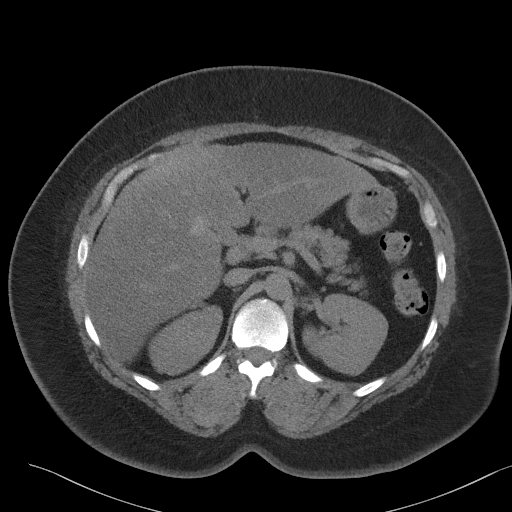
[im 80/99  soft-tissue]
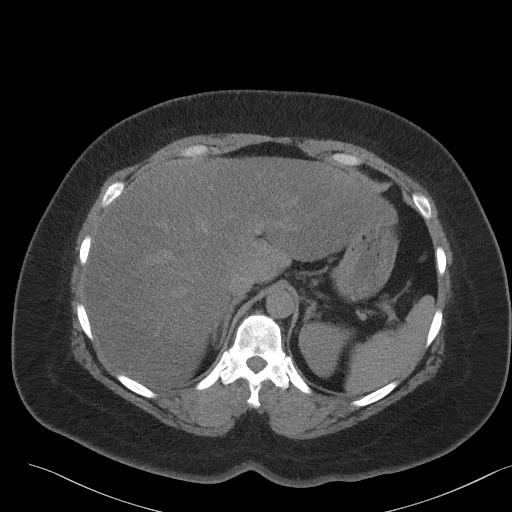
[im 87/99  soft-tissue]
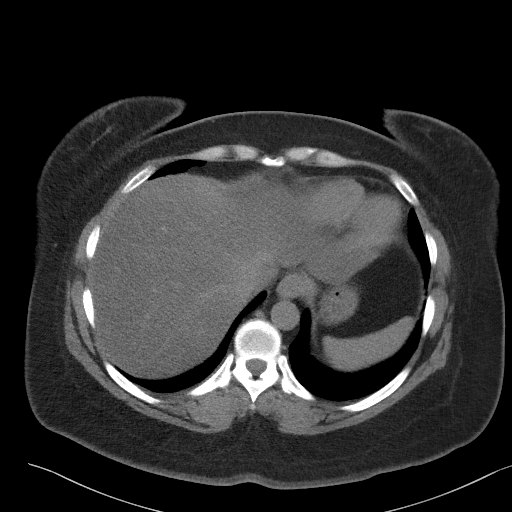
[im 95/99  soft-tissue]
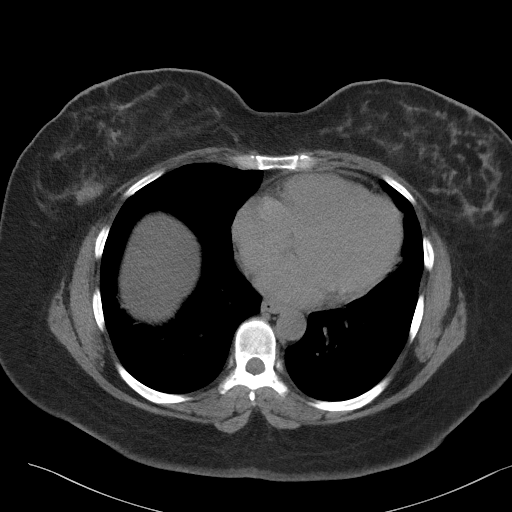

[Series 5: coronal st · coronal · 0.96mm/px · 3 of 107 slices shown]
[im 36/107  soft-tissue]
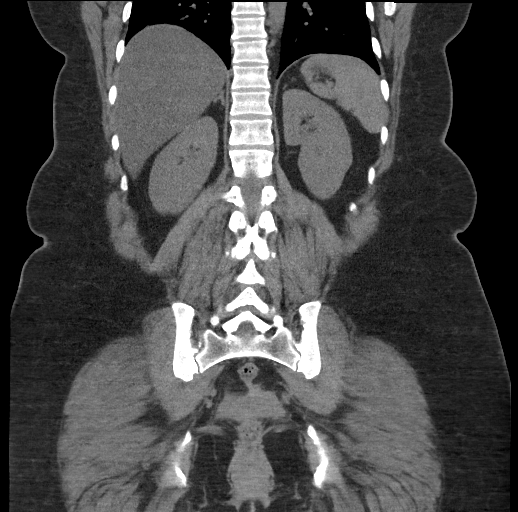
[im 48/107  soft-tissue]
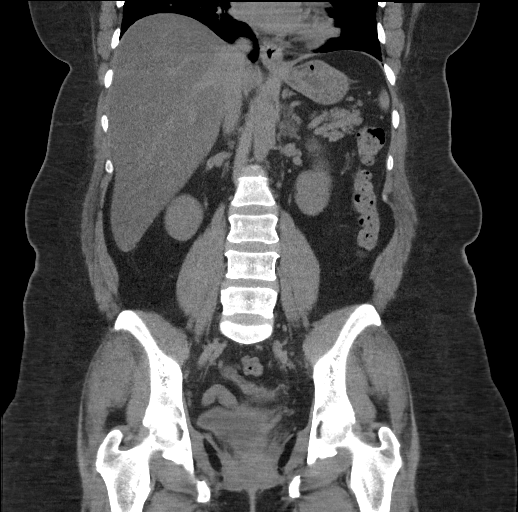
[im 59/107  soft-tissue]
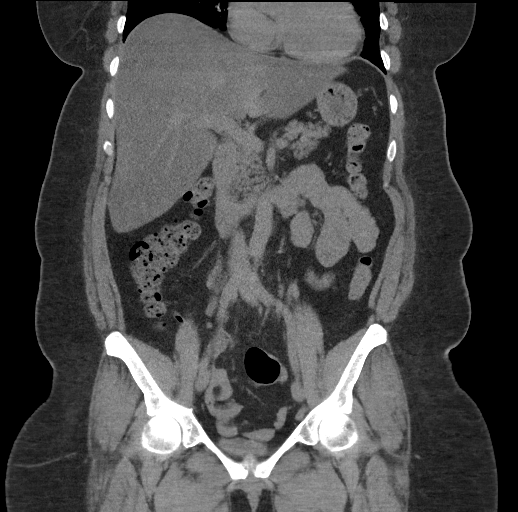

[17 of 46 positions shown; findings below may reference images not displayed]

FINDINGS: Lower chest: No acute abnormality.

Hepatobiliary: Liver mildly enlarged, 25 cm transversely. Liver
demonstrates diffuse decreased attenuation consistent with fatty
infiltration. Focal fatty sparing is noted adjacent to the
gallbladder. No liver mass. 2 small dependent gallstones. No
gallbladder wall thickening or pericholecystic fluid or
inflammation. No bile duct dilation.

Pancreas: Unremarkable. No pancreatic ductal dilatation or
surrounding inflammatory changes.

Spleen: Normal in size without focal abnormality.

Adrenals/Urinary Tract: No adrenal masses. Kidneys normal in size,
orientation and position. No renal mass, stone or hydronephrosis.
Normal ureters. Normal bladder.

Stomach/Bowel: Stomach is within normal limits. Appendix appears
normal. No evidence of bowel wall thickening, distention, or
inflammatory changes.

Vascular/Lymphatic: No significant vascular findings are present. No
enlarged abdominal or pelvic lymph nodes.

Reproductive: Status post hysterectomy. No adnexal masses.

Other: No abdominal wall hernia or abnormality. No abdominopelvic
ascites.

Musculoskeletal: No skeletal abnormality. No CT evidence of
significant disc bulging or of a disc herniation. No evidence of
lumbar nerve root impingement.
IMPRESSION: 1. No acute findings within the abdomen or pelvis. No findings to
account for the patient's right lower back and right leg pain.
2. Mild hepatomegaly and diffuse hepatic steatosis.

## 2023-03-16 NOTE — Telephone Encounter (Signed)
 Patient was identified as falling into the [place] Measure - Diabetes.   Patient was: Appointment scheduled with primary care provider in the next 30 days.  FOV 04/08/23

## 2023-03-24 NOTE — Progress Notes (Signed)
   03/24/2023  Patient ID: Kim Delgado, female   DOB: 10-19-1975, 48 y.o.   MRN: [id number]  Pharmacy Quality Measure Review  This patient is appearing on a report for TNM:  DM with A1c >8%  Last documented A1c or GMI 8.7% on 12/16/22  -Patient sees PCP for DM f/u 04/05/2023 -Pharmacy dispense report reflects good adherence to metformin XR 1000mg  BID and glipizide 5mg  BID, but it appears patient has not filled Jardiance  -Recommend identifying barrier(s) to Jardiance adherence and referral to PharmD to assist with this and medication management of DM  Lenna Gilford, PharmD, DPLA

## 2023-04-08 ENCOUNTER — Encounter: Payer: Self-pay | Admitting: Nurse Practitioner

## 2023-04-08 ENCOUNTER — Ambulatory Visit: Payer: BC Managed Care – PPO | Admitting: Nurse Practitioner

## 2023-04-08 VITALS — BP 136/74 | HR 82 | Temp 98.2°F | Ht 68.0 in | Wt 264.6 lb

## 2023-04-08 DIAGNOSIS — E1169 Type 2 diabetes mellitus with other specified complication: Secondary | ICD-10-CM | POA: Diagnosis not present

## 2023-04-08 DIAGNOSIS — E039 Hypothyroidism, unspecified: Secondary | ICD-10-CM

## 2023-04-08 DIAGNOSIS — Z7984 Long term (current) use of oral hypoglycemic drugs: Secondary | ICD-10-CM | POA: Diagnosis not present

## 2023-04-08 DIAGNOSIS — E1165 Type 2 diabetes mellitus with hyperglycemia: Secondary | ICD-10-CM

## 2023-04-08 DIAGNOSIS — R2242 Localized swelling, mass and lump, left lower limb: Secondary | ICD-10-CM | POA: Diagnosis not present

## 2023-04-08 DIAGNOSIS — E785 Hyperlipidemia, unspecified: Secondary | ICD-10-CM | POA: Diagnosis not present

## 2023-04-08 DIAGNOSIS — Z6841 Body Mass Index (BMI) 40.0 and over, adult: Secondary | ICD-10-CM

## 2023-04-08 LAB — BASIC METABOLIC PANEL WITH GFR
BUN: 12 mg/dL (ref 6–23)
CO2: 26 meq/L (ref 19–32)
Calcium: 9.3 mg/dL (ref 8.4–10.5)
Chloride: 102 meq/L (ref 96–112)
Creatinine, Ser: 0.67 mg/dL (ref 0.40–1.20)
GFR: 103.12 mL/min (ref >60.00)
Glucose, Bld: 153 mg/dL — ABNORMAL HIGH (ref 70–99)
Potassium: 4.1 meq/L (ref 3.5–5.1)
Sodium: 139 meq/L (ref 135–145)

## 2023-04-08 LAB — LDL CHOLESTEROL, DIRECT: Direct LDL: 86 mg/dL

## 2023-04-08 LAB — TSH: TSH: 1.22 u[IU]/mL (ref 0.35–5.50)

## 2023-04-08 LAB — HEMOGLOBIN A1C: Hgb A1c MFr Bld: 8.7 % — ABNORMAL HIGH (ref 4.6–6.5)

## 2023-04-08 LAB — T4, FREE: Free T4: 1.22 ng/dL (ref 0.60–1.60)

## 2023-04-08 MED ORDER — GLIPIZIDE 5 MG PO TABS: 5.0000 mg | ORAL_TABLET | Freq: Two times a day (BID) | ORAL | 1 refills | Status: DC

## 2023-04-08 MED ORDER — METFORMIN HCL ER 500 MG PO TB24: 1000.0000 mg | ORAL_TABLET | Freq: Two times a day (BID) | ORAL | 1 refills | Status: DC

## 2023-04-08 MED ORDER — PRAVASTATIN SODIUM 20 MG PO TABS: 20.0000 mg | ORAL_TABLET | Freq: Every evening | ORAL | 1 refills | Status: DC

## 2023-04-08 MED ORDER — FREESTYLE LIBRE 14 DAY SENSOR MISC: 1.0000 [IU] | 11 refills | Status: AC

## 2023-04-08 MED ORDER — EUTHYROX 150 MCG PO TABS: 150.0000 ug | ORAL_TABLET | Freq: Every day | ORAL | 1 refills | Status: DC

## 2023-04-08 MED ORDER — FREESTYLE LIBRE READER DEVI: 1.0000 [IU] | Freq: Once | 0 refills | Status: AC

## 2023-04-08 NOTE — Assessment & Plan Note (Signed)
 Agreed to weight management referral Wt Readings from Last 3 Encounters:  04/08/23 264 lb 9.6 oz (120 kg)  12/16/22 272 lb 9.6 oz (123.7 kg)  09/01/22 269 lb 6.4 oz (122.2 kg)

## 2023-04-08 NOTE — Assessment & Plan Note (Signed)
 Repeat Tsh and T4 Maintain euthyrox dose Refill sent

## 2023-04-08 NOTE — Assessment & Plan Note (Signed)
 Did not start jardiance as recommended. Fasting Home glucose: >150s Current use of glipizide and metformin States she has made dietary modification No consistent exercise regimen Did not schedule appointment with ophthalmology Normal UACr Last LDL at 77, no statin at this time  Repeat hgbA1c, bmp, direct ldl Advised to start and maintain jardiance as prescribed She agreed to ophthalmology referral F/up in 3months

## 2023-04-08 NOTE — Addendum Note (Signed)
 Addended by: Michaela Corner on: 04/08/2023 09:57 PM   Modules accepted: Orders

## 2023-04-08 NOTE — Progress Notes (Signed)
 Established Patient Visit  Patient: Kim Delgado   DOB: 03-28-1975   48 y.o. Female  MRN: [id number] Visit Date: 04/08/2023  Subjective:    Chief Complaint  Patient presents with   Follow-up    3 month f/u   Leg Pain  Incident onset: 3months ago. The incident occurred at home. Injury mechanism: blunt trauma. Pain location: left calf. The quality of the pain is described as aching and cramping. The pain is moderate. The pain has been Constant since onset. Pertinent negatives include no inability to bear weight, loss of motion, loss of sensation, muscle weakness, numbness or tingling. She reports no foreign bodies present. The symptoms are aggravated by palpation and weight bearing. Treatments tried: compression stocking. The treatment provided no relief.   Diabetes [facility]) Did not start jardiance as recommended. Fasting Home glucose: >150s Current use of glipizide and metformin States she has made dietary modification No consistent exercise regimen Did not schedule appointment with ophthalmology Normal UACr Last LDL at 77, no statin at this time  Repeat hgbA1c, bmp, direct ldl Advised to start and maintain jardiance as prescribed She agreed to ophthalmology referral F/up in 3months  Acquired hypothyroidism Repeat Tsh and T4 Maintain euthyrox dose Refill sent  Morbid obesity (HCC) Agreed to weight management referral Wt Readings from Last 3 Encounters:  04/08/23 264 lb 9.6 oz (120 kg)  12/16/22 272 lb 9.6 oz (123.7 kg)  09/01/22 269 lb 6.4 oz (122.2 kg)     Reviewed medical, surgical, and social history today  Medications: Outpatient Medications Prior to Visit  Medication Sig   BLACK CURRANT SEED OIL PO Take 2 capsules by mouth daily.   empagliflozin (JARDIANCE) 10 MG TABS tablet Take 1 tablet (10 mg total) by mouth daily before breakfast.   EUTHYROX 150 MCG tablet Take 1 tablet (150 mcg total) by mouth daily before breakfast.   glipiZIDE (GLUCOTROL) 5 MG  tablet Take 1 tablet (5 mg total) by mouth 2 (two) times daily with breakfast and lunch.   hydrocortisone (ANUSOL-HC) 2.5 % rectal cream Place 1 Application rectally 2 (two) times daily.   hydrocortisone (ANUSOL-HC) 25 MG suppository Place 1 suppository (25 mg total) rectally 2 (two) times daily.   hydrOXYzine (VISTARIL) 25 MG capsule Take 1 capsule (25 mg total) by mouth at bedtime as needed.   hyoscyamine (LEVSIN SL) 0.125 MG SL tablet Place 1 tablet (0.125 mg total) under the tongue every 6 (six) hours as needed for cramping (nausea, diarrhea).   linaclotide (LINZESS) 72 MCG capsule Take 1 capsule (72 mcg total) by mouth daily before breakfast.   metFORMIN (GLUCOPHAGE-XR) 500 MG 24 hr tablet Take 2 tablets (1,000 mg total) by mouth 2 (two) times daily after a meal.   Multiple Vitamins-Minerals (EMERGEN-C VITAMIN C PO) Take 1 packet by mouth daily as needed (immune health/support).   naproxen (NAPROSYN) 500 MG tablet Take 1 tablet (500 mg total) by mouth 2 (two) times daily as needed.   OVER THE COUNTER MEDICATION Take 2 capsules by mouth daily. Super cell food  Sea moss with bladderwrack and burdock root   sodium chloride (OCEAN) 0.65 % SOLN nasal spray Place 1 spray into both nostrils daily as needed for congestion.   tiZANidine (ZANAFLEX) 4 MG tablet Take 1 tablet (4 mg total) by mouth daily as needed for muscle spasms.   No facility-administered medications prior to visit.   Reviewed past medical and social  history.   ROS per HPI above      Objective:  BP 136/74 (BP Location: Right Arm, Patient Position: Sitting, Cuff Size: Large)   Pulse 82   Temp 98.2 F (36.8 C) (Temporal)   Ht 5\' 8"  (1.727 m)   Wt 264 lb 9.6 oz (120 kg)   SpO2 97%   BMI 40.23 kg/m      Physical Exam Vitals and nursing note reviewed.  Cardiovascular:     Rate and Rhythm: Normal rate and regular rhythm.     Pulses: Normal pulses.     Heart sounds: Normal heart sounds.  Pulmonary:     Effort:  Pulmonary effort is normal.     Breath sounds: Normal breath sounds.  Musculoskeletal:        General: Tenderness present.     Left lower leg: Edema present.  Skin:    Findings: No erythema.  Neurological:     Mental Status: She is alert and oriented to person, place, and time.     No results found for any visits on 04/08/23.    Assessment & Plan:    Problem List Items Addressed This Visit     Acquired hypothyroidism   Repeat Tsh and T4 Maintain euthyrox dose Refill sent      Relevant Orders   T4, free   TSH   Diabetes (HCC) - Primary   Did not start jardiance as recommended. Fasting Home glucose: >150s Current use of glipizide and metformin States she has made dietary modification No consistent exercise regimen Did not schedule appointment with ophthalmology Normal UACr Last LDL at 77, no statin at this time  Repeat hgbA1c, bmp, direct ldl Advised to start and maintain jardiance as prescribed She agreed to ophthalmology referral F/up in 3months      Relevant Medications   Continuous Glucose Sensor (FREESTYLE LIBRE 14 DAY SENSOR) MISC   Continuous Glucose Receiver (FREESTYLE LIBRE READER) DEVI   Other Relevant Orders   Hemoglobin A1c   Basic metabolic panel with GFR   Ambulatory referral to Ophthalmology   Hyperlipidemia associated with type 2 diabetes mellitus (HCC)   Relevant Orders   Direct LDL   Morbid obesity (HCC)   Agreed to weight management referral Wt Readings from Last 3 Encounters:  04/08/23 264 lb 9.6 oz (120 kg)  12/16/22 272 lb 9.6 oz (123.7 kg)  09/01/22 269 lb 6.4 oz (122.2 kg)         Relevant Orders   Amb Ref to Medical Weight Management   Other Visit Diagnoses       Localized swelling of left lower leg       Relevant Orders   US Venous Img Lower Unilateral Left (DVT)      Return in about 3 months (around 07/08/2023) for HTN, DM, hyperlipidemia (fasting).     Alysia Penna, NP

## 2023-04-08 NOTE — Patient Instructions (Signed)
 Go to lab Maintain Heart healthy diet and daily exercise. Maintain current medications.

## 2023-04-11 ENCOUNTER — Encounter (INDEPENDENT_AMBULATORY_CARE_PROVIDER_SITE_OTHER): Payer: Self-pay

## 2023-04-11 ENCOUNTER — Ambulatory Visit
Admission: RE | Admit: 2023-04-11 | Discharge: 2023-04-11 | Disposition: A | Discharge status: Home / Self Care | Source: Ambulatory Visit | Attending: Nurse Practitioner

## 2023-04-11 DIAGNOSIS — R2242 Localized swelling, mass and lump, left lower limb: Secondary | ICD-10-CM

## 2023-04-12 ENCOUNTER — Encounter: Payer: Self-pay | Admitting: Nurse Practitioner

## 2023-04-20 ENCOUNTER — Other Ambulatory Visit

## 2023-05-31 IMAGING — US US ABDOMEN LIMITED
1 series · 14 of 25 positions shown · non-contrast
Comparison: CT from 01/10/2021

CLINICAL DATA: Initial evaluation for right flank pain, elevated
lipase.

EXAM:
ULTRASOUND ABDOMEN LIMITED RIGHT UPPER QUADRANT

[Series 1: us abdomen limited · 0.25mm/px · 14 of 54 slices shown]
[im 1/54]
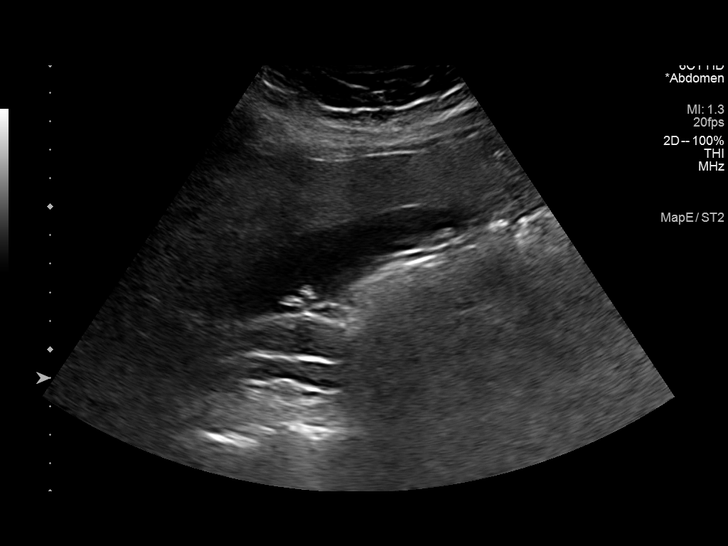
[im 5/54]
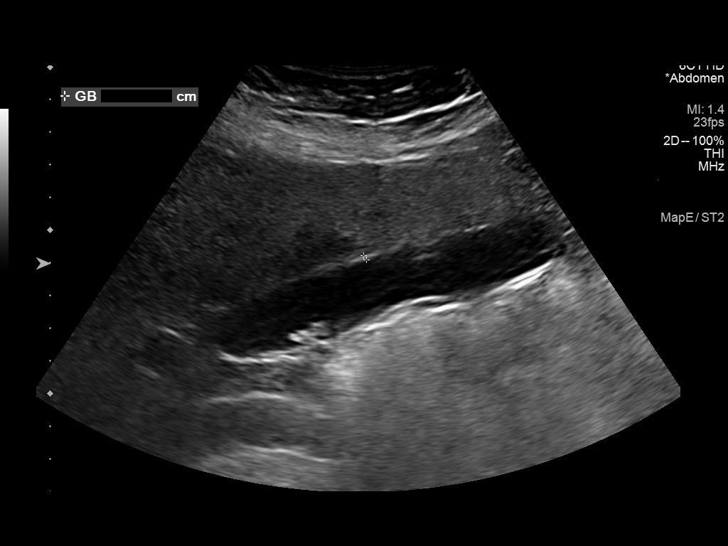
[im 9/54]
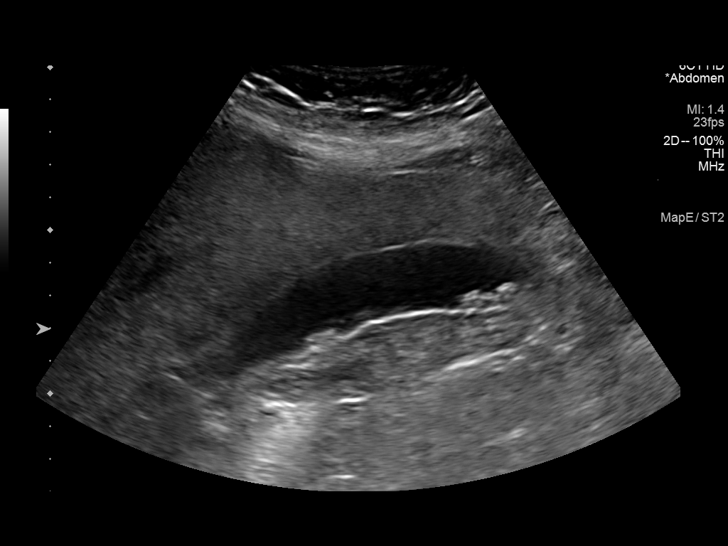
[im 14/54]
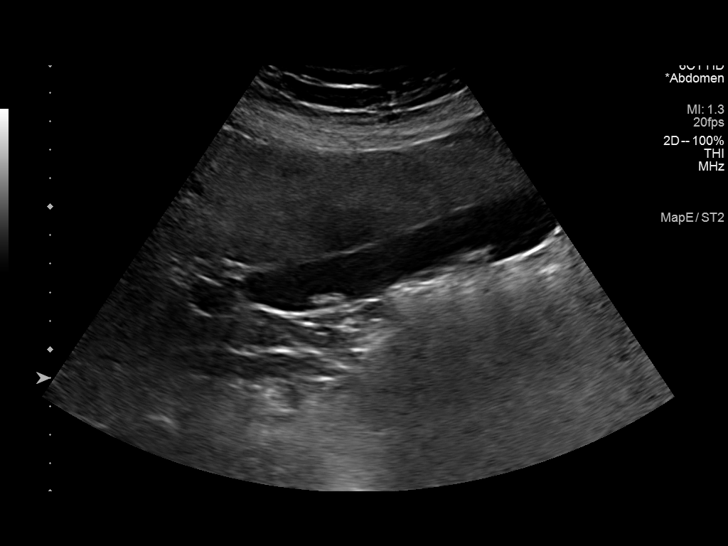
[im 18/54]
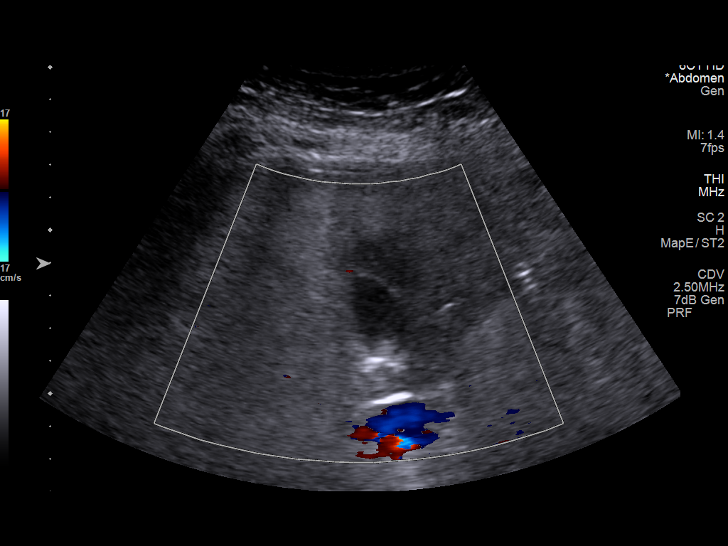
[im 20/54]
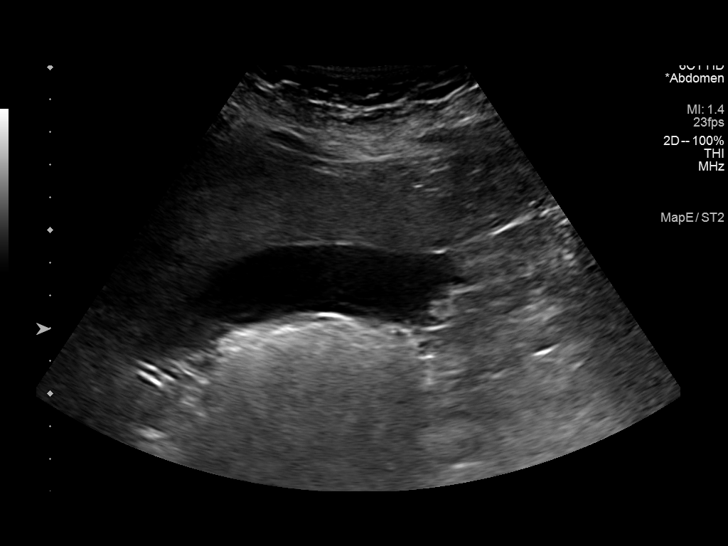
[im 25/54]
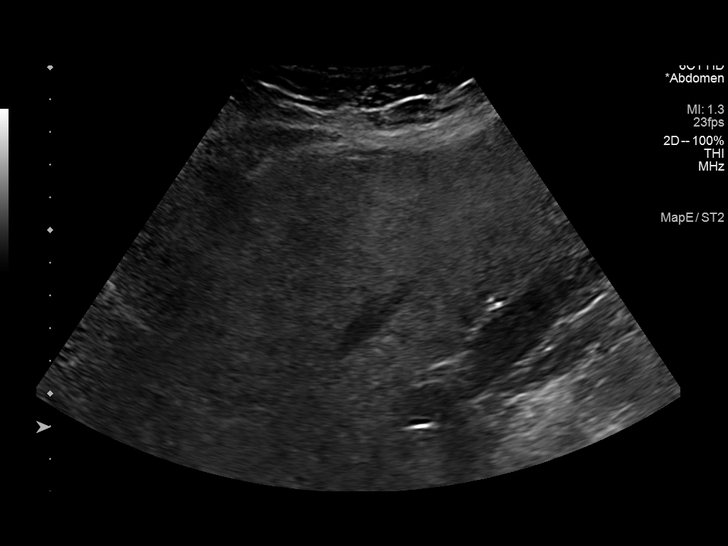
[im 29/54]
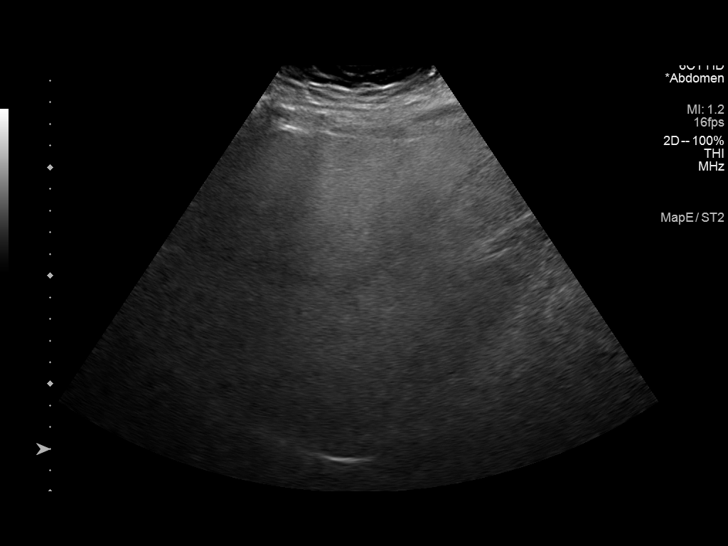
[im 34/54]
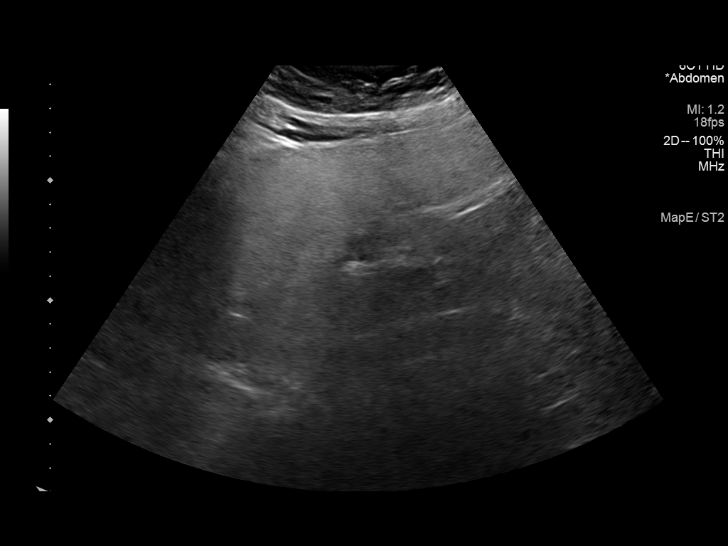
[im 36/54]
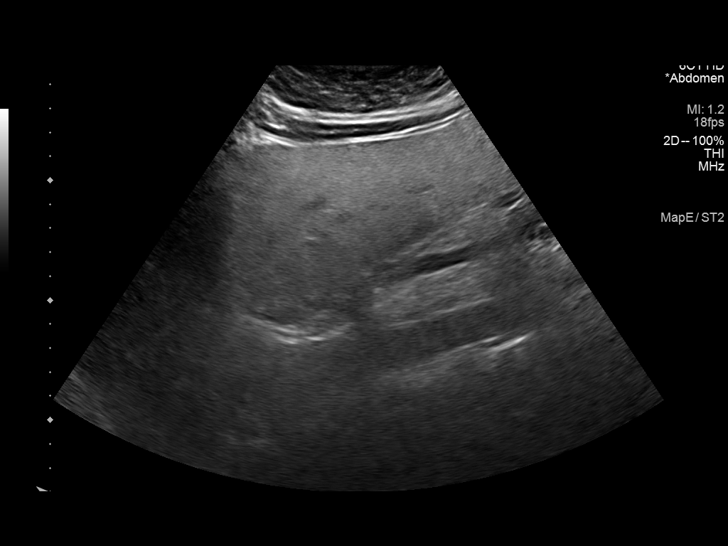
[im 40/54]
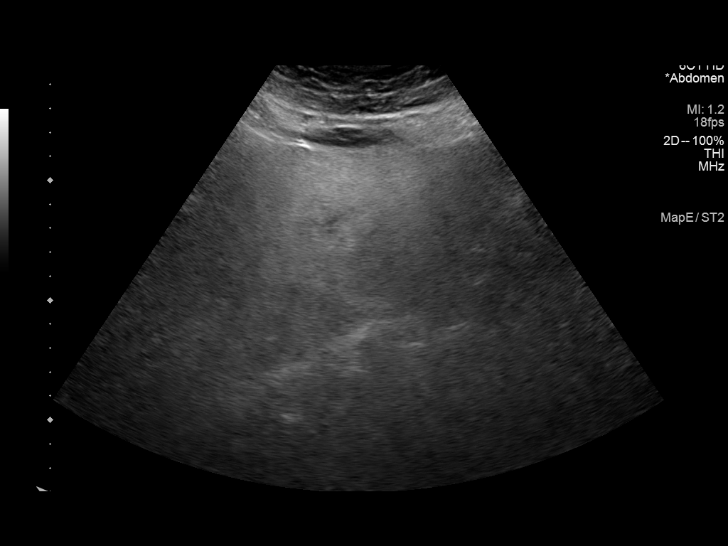
[im 45/54]
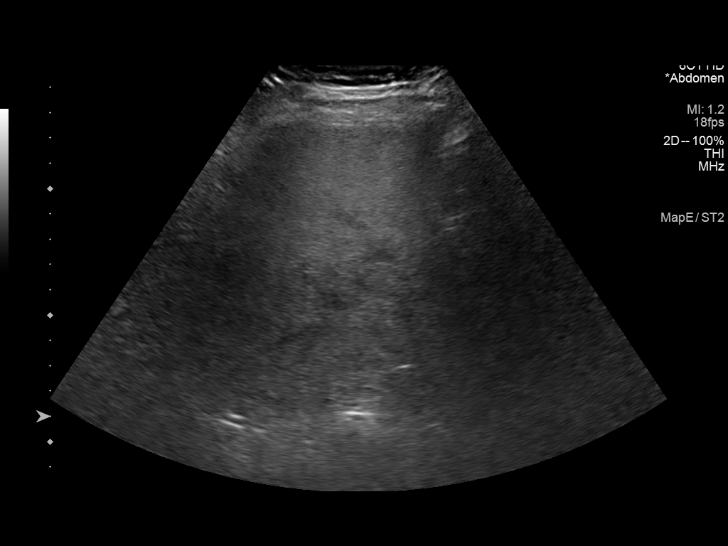
[im 49/54]
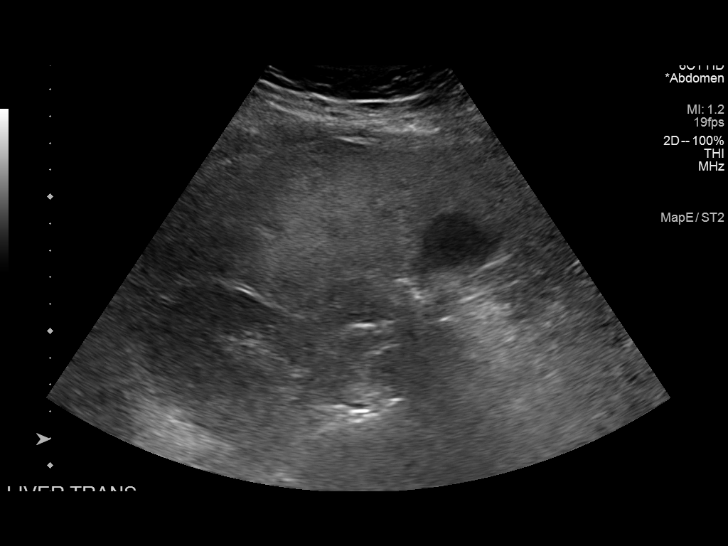
[im 54/54]
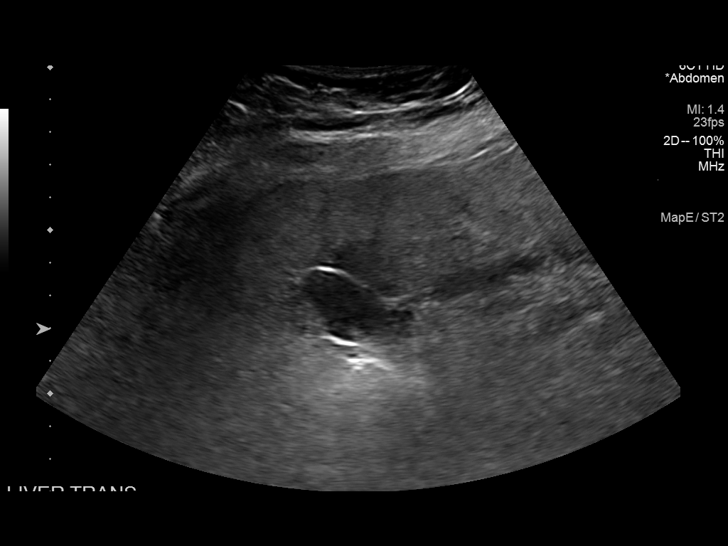

[14 of 25 positions shown; findings below may reference images not displayed]

FINDINGS: Gallbladder:

Multiple stones seen within the gallbladder lumen, largest of which
measures 8 mm. Small amount of sludge noted as well. Gallbladder
wall within normal limits measuring 1.6 mm. No free pericholecystic
fluid. No sonographic Murphy sign indicated by the sonographer.

Common bile duct:

Diameter: 3.3 mm

Liver:

No focal lesion identified. Dense echogenic echotexture seen within
the hepatic parenchyma. Focal fatty sparing noted adjacent to the
gallbladder fossa. Portal vein is patent on color Doppler imaging
with normal direction of blood flow towards the liver.

Other: None.
IMPRESSION: 1. Cholelithiasis with small amount of gallbladder sludge. No
sonographic features to suggest acute cholecystitis.
2. No biliary dilatation.
3. Dense echogenic echotexture within the hepatic parenchyma,
suggesting steatosis.

## 2023-07-01 ENCOUNTER — Telehealth: Payer: Self-pay

## 2023-07-01 DIAGNOSIS — E039 Hypothyroidism, unspecified: Secondary | ICD-10-CM

## 2023-07-01 MED ORDER — LEVOTHYROXINE SODIUM 150 MCG PO TABS: 150.0000 ug | ORAL_TABLET | Freq: Every day | ORAL | 1 refills | Status: DC

## 2023-07-01 NOTE — Telephone Encounter (Signed)
 Please advise  Copied from CRM [id number]. Topic: Clinical - Prescription Issue >> Jun 29, 2023  4:46 PM Rea BROCKS wrote: Reason for CRM: [facility] [phone] called in and stated that Euthyrox  150mg  can no longer be dispensed and is asking if the provider can switch to the levothyroxine  (alvogen brand).

## 2023-07-01 NOTE — Addendum Note (Signed)
 Addended by: KATHEEN GARDEN L on: 07/01/2023 12:20 PM   Modules accepted: Orders

## 2023-07-01 NOTE — Telephone Encounter (Signed)
 Called patient and informed her of the medication change. She asked if there major difference in the medications?

## 2023-07-06 NOTE — Telephone Encounter (Signed)
 Called patient and informed her of Charlotte's comment. She thanked me for calling and all (if any) questions were answered.

## 2023-07-16 IMAGING — RF DG CHOLANGIOGRAM OPERATIVE
1 series · 4 of 4 positions shown · non-contrast
Comparison: None Available.

CLINICAL DATA: 45-year-old female with a history of cholelithiasis

EXAM:
INTRAOPERATIVE CHOLANGIOGRAM
TECHNIQUE: Cholangiographic images from the C-arm fluoroscopic device were
submitted for interpretation post-operatively. Please see the
procedural report for the amount of contrast and the fluoroscopy
time utilized.
FLUOROSCOPY:
Radiation Exposure Index (as provided by the fluoroscopic device):
3.6 mGy Kerma

[Series 1: dg cholangiogram · 0.20mm/px · 4 of 42 frames shown]
[frame 7/42]
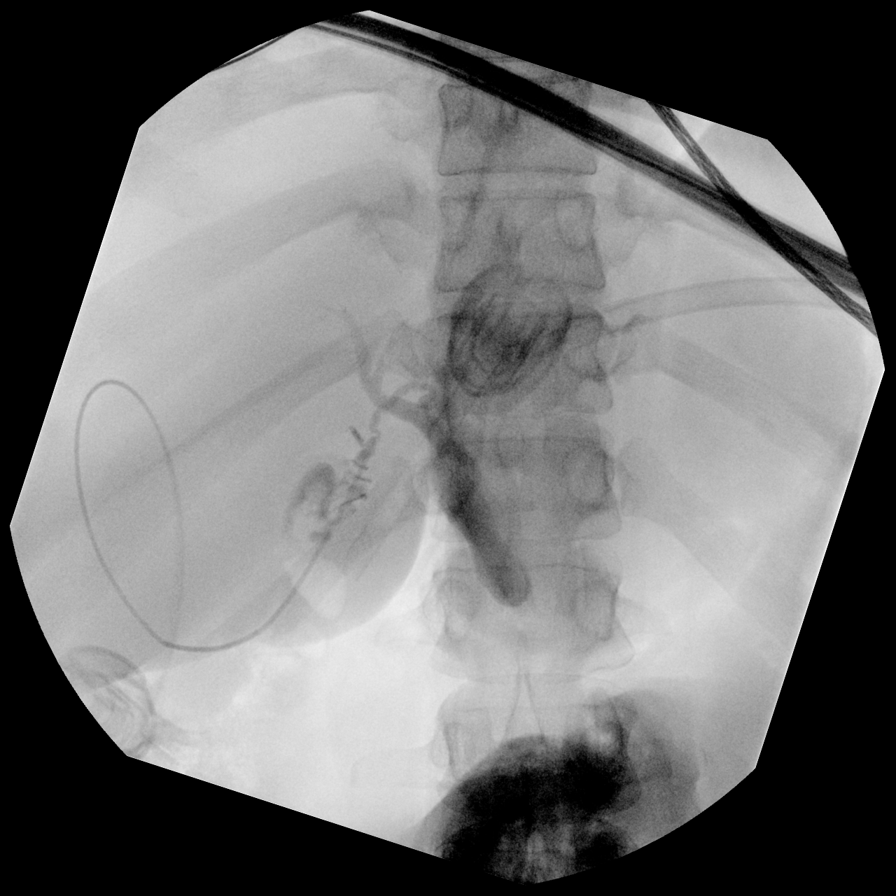
[frame 22/42]
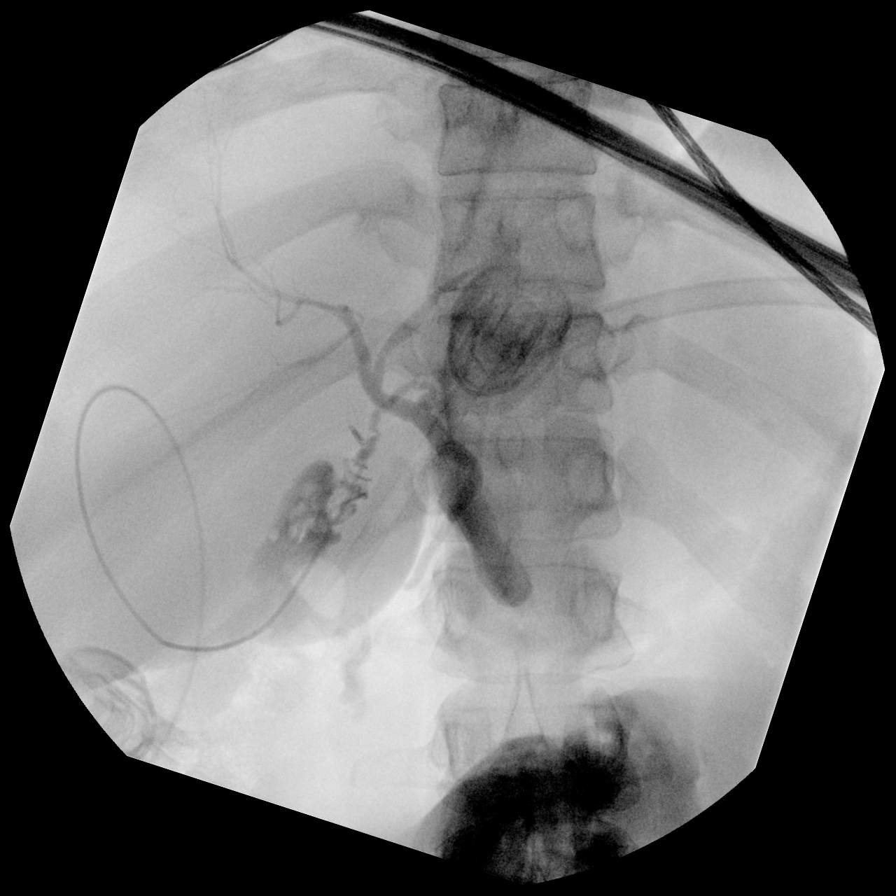
[frame 35/42]
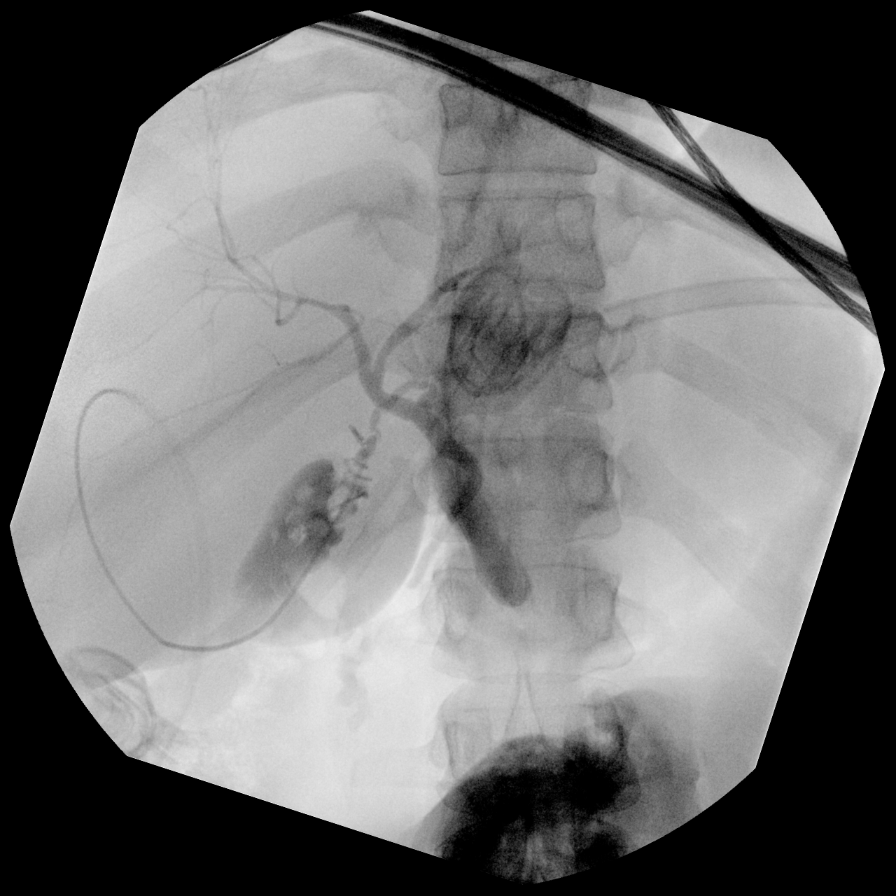
[frame 36/42]
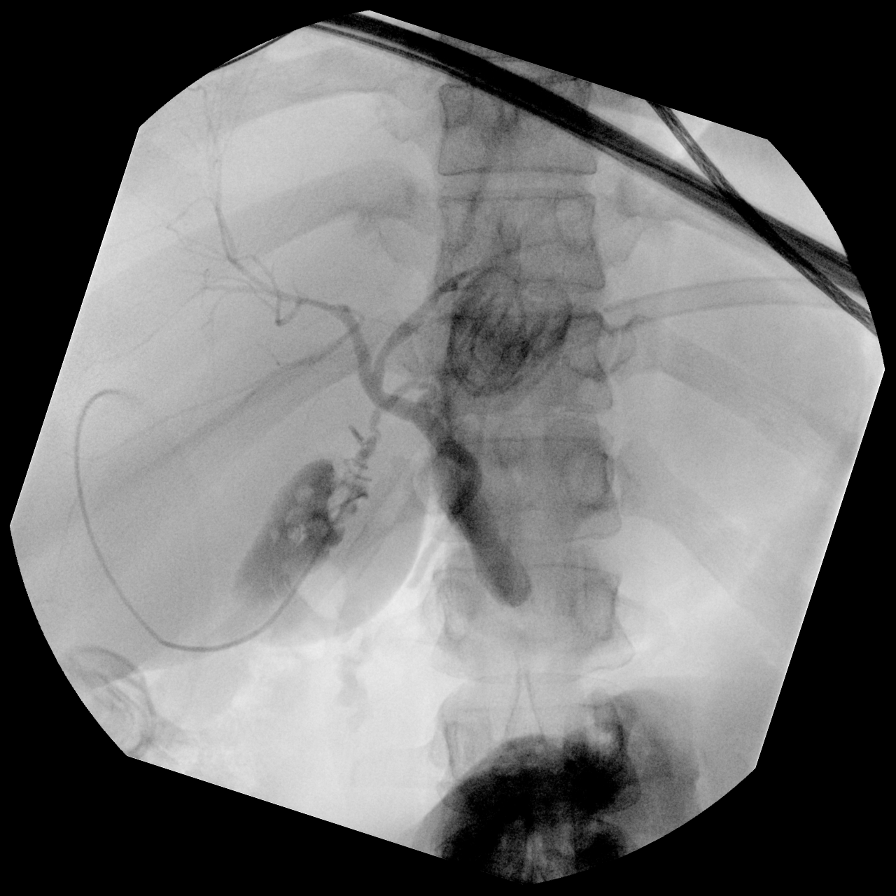

[4 of 4 positions shown; findings below may reference images not displayed]

FINDINGS: Surgical instruments project over the upper abdomen.

There is cannulation of the cystic duct/gallbladder neck, with
antegrade infusion of contrast. Caliber of the extrahepatic ductal
system within normal limits.

No definite filling defect within the extrahepatic ducts identified.

Free flow of contrast across the ampulla.
IMPRESSION: Intraoperative cholangiogram demonstrates extrahepatic biliary ducts
of unremarkable caliber, with no definite filling defects
identified. Free flow of contrast across the ampulla.

Please refer to the dictated operative report for full details of
intraoperative findings and procedure

## 2023-07-29 ENCOUNTER — Ambulatory Visit: Admitting: Nurse Practitioner

## 2023-07-29 ENCOUNTER — Encounter: Payer: Self-pay | Admitting: Nurse Practitioner

## 2023-07-29 VITALS — BP 138/76 | HR 76 | Temp 98.5°F | Ht 68.0 in | Wt 266.6 lb

## 2023-07-29 DIAGNOSIS — R109 Unspecified abdominal pain: Secondary | ICD-10-CM

## 2023-07-29 DIAGNOSIS — Z7984 Long term (current) use of oral hypoglycemic drugs: Secondary | ICD-10-CM

## 2023-07-29 DIAGNOSIS — G8929 Other chronic pain: Secondary | ICD-10-CM

## 2023-07-29 DIAGNOSIS — E785 Hyperlipidemia, unspecified: Secondary | ICD-10-CM | POA: Diagnosis not present

## 2023-07-29 DIAGNOSIS — E1169 Type 2 diabetes mellitus with other specified complication: Secondary | ICD-10-CM

## 2023-07-29 DIAGNOSIS — K5904 Chronic idiopathic constipation: Secondary | ICD-10-CM | POA: Diagnosis not present

## 2023-07-29 LAB — LIPID PANEL
Cholesterol: 153 mg/dL (ref 0–200)
HDL: 37.3 mg/dL — ABNORMAL LOW (ref >39.00)
LDL Cholesterol: 83 mg/dL (ref 0–99)
NonHDL: 115.37
Total CHOL/HDL Ratio: 4
Triglycerides: 160 mg/dL — ABNORMAL HIGH (ref 0.0–149.0)
VLDL: 32 mg/dL (ref 0.0–40.0)

## 2023-07-29 LAB — RENAL FUNCTION PANEL
Albumin: 4.4 g/dL (ref 3.5–5.2)
BUN: 10 mg/dL (ref 6–23)
CO2: 28 meq/L (ref 19–32)
Calcium: 9.3 mg/dL (ref 8.4–10.5)
Chloride: 101 meq/L (ref 96–112)
Creatinine, Ser: 0.68 mg/dL (ref 0.40–1.20)
GFR: 102.54 mL/min (ref >60.00)
Glucose, Bld: 177 mg/dL — ABNORMAL HIGH (ref 70–99)
Phosphorus: 3.3 mg/dL (ref 2.3–4.6)
Potassium: 3.9 meq/L (ref 3.5–5.1)
Sodium: 139 meq/L (ref 135–145)

## 2023-07-29 LAB — MICROALBUMIN / CREATININE URINE RATIO
Creatinine,U: 36.7 mg/dL
Microalb Creat Ratio: 19.2 mg/g (ref 0.0–30.0)
Microalb, Ur: 0.7 mg/dL (ref 0.0–1.9)

## 2023-07-29 LAB — HEMOGLOBIN A1C: Hgb A1c MFr Bld: 7.5 % — ABNORMAL HIGH (ref 4.6–6.5)

## 2023-07-29 MED ORDER — NAPROXEN 500 MG PO TABS: 500.0000 mg | ORAL_TABLET | Freq: Two times a day (BID) | ORAL | 5 refills | Status: AC | PRN

## 2023-07-29 MED ORDER — EMPAGLIFLOZIN 10 MG PO TABS: 10.0000 mg | ORAL_TABLET | Freq: Every day | ORAL | 1 refills | Status: DC

## 2023-07-29 MED ORDER — LINACLOTIDE 72 MCG PO CAPS: 72.0000 ug | ORAL_CAPSULE | Freq: Every day | ORAL | 5 refills | Status: AC

## 2023-07-29 NOTE — Patient Instructions (Signed)
 Schedule appointment for annual eye exam. Go to lab Maintain current med doses

## 2023-07-29 NOTE — Progress Notes (Signed)
 Established Patient Visit  Patient: Kim Delgado   DOB: [birth year]   48 y.o. Female  MRN: [id number] Visit Date: 07/29/2023  Subjective:    Chief Complaint  Patient presents with   Follow-up    (Fasting) 3 months for HTN, DM  Refills needed for Metformin ,Jardiance , Linzess , Muscle relaxer and pain medication     HPI Chronic right flank pain Use of naproxen  and linzess  prn Refill sent  Hyperlipidemia associated with type 2 diabetes mellitus (HCC) Declined to take any statin Repeat lipid panel  Diabetes (HCC) Denies any adverse effects with jardiance , glipizide  and metformin  Reminded to schedule appointment for DIABETES eye exam. BP at goal  Repeat hgbA1c, UACr, BMP and lipid panel today Maintain med doses F/up in 3months  Wt Readings from Last 3 Encounters:  07/29/23 266 lb 9.6 oz (120.9 kg)  04/08/23 264 lb 9.6 oz (120 kg)  12/16/22 272 lb 9.6 oz (123.7 kg)    Reviewed medical, surgical, and social history today  Medications: Outpatient Medications Prior to Visit  Medication Sig   BLACK CURRANT SEED OIL PO Take 2 capsules by mouth daily.   Continuous Glucose Sensor (FREESTYLE LIBRE 14 DAY SENSOR) MISC 1 Units by Does not apply route every 14 (fourteen) days.   empagliflozin  (JARDIANCE ) 10 MG TABS tablet Take 1 tablet (10 mg total) by mouth daily before breakfast.   glipiZIDE  (GLUCOTROL ) 5 MG tablet Take 1 tablet (5 mg total) by mouth 2 (two) times daily with breakfast and lunch.   hydrocortisone  (ANUSOL -HC) 2.5 % rectal cream Place 1 Application rectally 2 (two) times daily.   hydrocortisone  (ANUSOL -HC) 25 MG suppository Place 1 suppository (25 mg total) rectally 2 (two) times daily.   hydrOXYzine  (VISTARIL ) 25 MG capsule Take 1 capsule (25 mg total) by mouth at bedtime as needed.   hyoscyamine  (LEVSIN  SL) 0.125 MG SL tablet Place 1 tablet (0.125 mg total) under the tongue every 6 (six) hours as needed for cramping (nausea, diarrhea).   levothyroxine   (SYNTHROID ) 150 MCG tablet Take 1 tablet (150 mcg total) by mouth daily.   metFORMIN  (GLUCOPHAGE -XR) 500 MG 24 hr tablet Take 2 tablets (1,000 mg total) by mouth 2 (two) times daily after a meal.   Multiple Vitamins-Minerals (EMERGEN-C VITAMIN C PO) Take 1 packet by mouth daily as needed (immune health/support).   OVER THE COUNTER MEDICATION Take 2 capsules by mouth daily. Super cell food  Sea moss with bladderwrack and burdock root   sodium chloride  (OCEAN) 0.65 % SOLN nasal spray Place 1 spray into both nostrils daily as needed for congestion.   tiZANidine  (ZANAFLEX ) 4 MG tablet Take 1 tablet (4 mg total) by mouth daily as needed for muscle spasms.   [DISCONTINUED] linaclotide  (LINZESS ) 72 MCG capsule Take 1 capsule (72 mcg total) by mouth daily before breakfast.   [DISCONTINUED] naproxen  (NAPROSYN ) 500 MG tablet Take 1 tablet (500 mg total) by mouth 2 (two) times daily as needed.   [DISCONTINUED] pravastatin  (PRAVACHOL ) 20 MG tablet Take 1 tablet (20 mg total) by mouth every evening.   No facility-administered medications prior to visit.   Reviewed past medical and social history.   ROS per HPI above      Objective:  BP 138/76 (BP Location: Left Arm, Patient Position: Sitting, Cuff Size: Large)   Pulse 76   Temp 98.5 F (36.9 C) (Oral)   Ht 5' 8 (1.727 m)   Wt 266 lb  9.6 oz (120.9 kg)   SpO2 98%   BMI 40.54 kg/m      Physical Exam Vitals and nursing note reviewed.  Cardiovascular:     Rate and Rhythm: Normal rate and regular rhythm.     Pulses: Normal pulses.     Heart sounds: Normal heart sounds.  Pulmonary:     Effort: Pulmonary effort is normal.     Breath sounds: Normal breath sounds.  Musculoskeletal:     Right lower leg: Edema present.     Left lower leg: Edema present.  Neurological:     Mental Status: She is alert and oriented to person, place, and time.     No results found for any visits on 07/29/23.    Assessment & Plan:    Problem List Items  Addressed This Visit     Chronic idiopathic constipation   Relevant Medications   linaclotide  (LINZESS ) 72 MCG capsule   Chronic right flank pain   Use of naproxen  and linzess  prn Refill sent      Relevant Medications   naproxen  (NAPROSYN ) 500 MG tablet   Diabetes (HCC) - Primary   Denies any adverse effects with jardiance , glipizide  and metformin  Reminded to schedule appointment for DIABETES eye exam. BP at goal  Repeat hgbA1c, UACr, BMP and lipid panel today Maintain med doses F/up in 3months      Relevant Orders   Hemoglobin A1c   Renal Function Panel   Microalbumin / creatinine urine ratio   Hyperlipidemia associated with type 2 diabetes mellitus (HCC)   Declined to take any statin Repeat lipid panel      Relevant Orders   Lipid panel   Return in about 3 months (around 10/29/2023) for DM, hyperlipidemia (fasting).     Roselie Mood, NP

## 2023-07-29 NOTE — Assessment & Plan Note (Addendum)
 Denies any adverse effects with jardiance , glipizide  and metformin  Reminded to schedule appointment for DIABETES eye exam. BP at goal  Repeat hgbA1c, UACr, BMP and lipid panel today Maintain med doses F/up in 3months

## 2023-07-29 NOTE — Assessment & Plan Note (Signed)
 Use of naproxen  and linzess  prn Refill sent

## 2023-07-29 NOTE — Assessment & Plan Note (Signed)
 Declined to take any statin Repeat lipid panel

## 2023-08-04 ENCOUNTER — Ambulatory Visit: Payer: Self-pay | Admitting: Nurse Practitioner

## 2023-08-04 DIAGNOSIS — Z7984 Long term (current) use of oral hypoglycemic drugs: Secondary | ICD-10-CM

## 2023-08-04 DIAGNOSIS — E1169 Type 2 diabetes mellitus with other specified complication: Secondary | ICD-10-CM

## 2023-08-04 MED ORDER — EMPAGLIFLOZIN 25 MG PO TABS: 25.0000 mg | ORAL_TABLET | Freq: Every day | ORAL | 1 refills | Status: DC

## 2023-08-04 NOTE — Assessment & Plan Note (Signed)
 Improved hgbA1c but not at goal: increase jardiance  to 25mg  daily Stable renal function Abnormal lipid panel: elevated triglyceride and low HDL Maintain a mediterranean diet F/up in 3months (fasting)

## 2023-08-11 ENCOUNTER — Telehealth: Payer: Self-pay | Admitting: Nurse Practitioner

## 2023-08-11 ENCOUNTER — Other Ambulatory Visit (HOSPITAL_COMMUNITY): Payer: Self-pay

## 2023-08-11 ENCOUNTER — Telehealth: Payer: Self-pay | Admitting: Pharmacy Technician

## 2023-08-11 NOTE — Telephone Encounter (Signed)
 Pharmacy Patient Advocate Encounter   Received notification from Pt Calls Messages that prior authorization for Linzess  72 MCG capsules is required/requested.   Insurance verification completed.   The patient is insured through [employer] .   Per test claim: PA required; PA submitted to above mentioned insurance via Prompt PA Key/confirmation #/EOC [id number] Status is pending

## 2023-08-11 NOTE — Telephone Encounter (Signed)
 Copied from CRM [id number]. Topic: Clinical - Medication Prior Auth >> Aug 11, 2023  9:49 AM Charolett L wrote: Reason for CRM: [facility] pharmacy is requesting prior auth for linaclotide  (LINZESS ) 72 MCG capsule to be sent

## 2023-08-11 NOTE — Telephone Encounter (Signed)
 MyChart message sent to patient about need for PA.

## 2023-08-11 NOTE — Telephone Encounter (Signed)
 PA request has been Submitted. New Encounter has been or will be created for follow up. For additional info see Pharmacy Prior Auth telephone encounter from 08/11/23.

## 2023-08-12 ENCOUNTER — Other Ambulatory Visit (HOSPITAL_COMMUNITY): Payer: Self-pay

## 2023-08-15 ENCOUNTER — Other Ambulatory Visit (HOSPITAL_COMMUNITY): Payer: Self-pay

## 2023-08-17 ENCOUNTER — Other Ambulatory Visit: Payer: Self-pay | Admitting: Nurse Practitioner

## 2023-08-17 DIAGNOSIS — E1165 Type 2 diabetes mellitus with hyperglycemia: Secondary | ICD-10-CM

## 2023-08-20 ENCOUNTER — Other Ambulatory Visit: Payer: Self-pay | Admitting: Nurse Practitioner

## 2023-08-20 DIAGNOSIS — E1169 Type 2 diabetes mellitus with other specified complication: Secondary | ICD-10-CM

## 2023-08-20 DIAGNOSIS — G8929 Other chronic pain: Secondary | ICD-10-CM

## 2023-08-22 NOTE — Telephone Encounter (Signed)
 Requesting: Tizanidine  (Zanaflex ) 4 mg  Directions: Take 1 tablet by mouth daily as needed for muscle spasms   Last Visit: 07/29/2023 Next Visit: 10/31/2023 Last Refill: 12/16/22  Please Advise

## 2023-08-30 ENCOUNTER — Other Ambulatory Visit (HOSPITAL_COMMUNITY): Payer: Self-pay

## 2023-08-30 NOTE — Telephone Encounter (Signed)
 Pharmacy Patient Advocate Encounter  Received notification from [employer] that Prior Authorization for Linzess  72 MCG capsules  has been APPROVED from 08/13/23 to 08/11/24. Ran test claim, Copay is $25.00. This test claim was processed through [facility]- copay amounts may vary at other pharmacies due to pharmacy/plan contracts, or as the patient moves through the different stages of their insurance plan.   PA #/Case ID/Reference #: [id number]

## 2023-08-31 ENCOUNTER — Other Ambulatory Visit: Payer: Self-pay | Admitting: Nurse Practitioner

## 2023-08-31 DIAGNOSIS — E1165 Type 2 diabetes mellitus with hyperglycemia: Secondary | ICD-10-CM

## 2023-10-31 ENCOUNTER — Encounter: Payer: Self-pay | Admitting: Nurse Practitioner

## 2023-10-31 ENCOUNTER — Ambulatory Visit: Admitting: Nurse Practitioner

## 2023-10-31 VITALS — BP 114/70 | HR 72 | Temp 98.4°F | Ht 68.0 in | Wt 256.0 lb

## 2023-10-31 DIAGNOSIS — E559 Vitamin D deficiency, unspecified: Secondary | ICD-10-CM

## 2023-10-31 DIAGNOSIS — E1165 Type 2 diabetes mellitus with hyperglycemia: Secondary | ICD-10-CM | POA: Diagnosis not present

## 2023-10-31 DIAGNOSIS — E039 Hypothyroidism, unspecified: Secondary | ICD-10-CM

## 2023-10-31 DIAGNOSIS — Z7984 Long term (current) use of oral hypoglycemic drugs: Secondary | ICD-10-CM

## 2023-10-31 DIAGNOSIS — E785 Hyperlipidemia, unspecified: Secondary | ICD-10-CM

## 2023-10-31 DIAGNOSIS — E1169 Type 2 diabetes mellitus with other specified complication: Secondary | ICD-10-CM | POA: Diagnosis not present

## 2023-10-31 LAB — COMPREHENSIVE METABOLIC PANEL WITH GFR
ALT: 17 U/L (ref 0–35)
AST: 20 U/L (ref 0–37)
Albumin: 4.3 g/dL (ref 3.5–5.2)
Alkaline Phosphatase: 89 U/L (ref 39–117)
BUN: 13 mg/dL (ref 6–23)
CO2: 28 meq/L (ref 19–32)
Calcium: 9.5 mg/dL (ref 8.4–10.5)
Chloride: 102 meq/L (ref 96–112)
Creatinine, Ser: 0.66 mg/dL (ref 0.40–1.20)
GFR: 103.09 mL/min (ref >60.00)
Glucose, Bld: 152 mg/dL — ABNORMAL HIGH (ref 70–99)
Potassium: 4.5 meq/L (ref 3.5–5.1)
Sodium: 137 meq/L (ref 135–145)
Total Bilirubin: 0.2 mg/dL (ref 0.2–1.2)
Total Protein: 7.6 g/dL (ref 6.0–8.3)

## 2023-10-31 LAB — LIPID PANEL
Cholesterol: 146 mg/dL (ref 0–200)
HDL: 37.7 mg/dL — ABNORMAL LOW (ref >39.00)
LDL Cholesterol: 89 mg/dL (ref 0–99)
NonHDL: 107.98
Total CHOL/HDL Ratio: 4
Triglycerides: 96 mg/dL (ref 0.0–149.0)
VLDL: 19.2 mg/dL (ref 0.0–40.0)

## 2023-10-31 LAB — HEMOGLOBIN A1C: Hgb A1c MFr Bld: 6.6 % — ABNORMAL HIGH (ref 4.6–6.5)

## 2023-10-31 LAB — VITAMIN D 25 HYDROXY (VIT D DEFICIENCY, FRACTURES): VITD: 38.55 ng/mL (ref 30.00–100.00)

## 2023-10-31 LAB — T4, FREE: Free T4: 0.98 ng/dL (ref 0.60–1.60)

## 2023-10-31 LAB — TSH: TSH: 1.39 u[IU]/mL (ref 0.35–5.50)

## 2023-10-31 MED ORDER — GLIPIZIDE 5 MG PO TABS: 5.0000 mg | ORAL_TABLET | Freq: Two times a day (BID) | ORAL | 1 refills | Status: AC

## 2023-10-31 MED ORDER — LEVOTHYROXINE SODIUM 150 MCG PO TABS: 150.0000 ug | ORAL_TABLET | Freq: Every day | ORAL | 1 refills | Status: AC

## 2023-10-31 MED ORDER — METFORMIN HCL ER 500 MG PO TB24: 1000.0000 mg | ORAL_TABLET | Freq: Two times a day (BID) | ORAL | 1 refills | Status: AC

## 2023-10-31 NOTE — Patient Instructions (Addendum)
 Dr. Reuben Burkes at [facility] [address] [place] at [place]  Go to lab Schedule appointment for DIABETES eye exam

## 2023-10-31 NOTE — Assessment & Plan Note (Signed)
 Declined to take any statin Repeat lipid panel

## 2023-10-31 NOTE — Progress Notes (Signed)
 Established Patient Visit  Patient: Kim Delgado   DOB: [birth year]   48 y.o. Female  MRN: [id number] Visit Date: 10/31/2023  Subjective:    Chief Complaint  Patient presents with   Follow-up    FASTING 3 month follow up for DM Hyper; Hyperlipidemia   HPI Diabetes (HCC) Denies any adverse effects with jardiance , glipizide , and metformin  Advised to schedule appointment for annual eye exam. BP at goal  Repeat hgbA1c, CMP, lipid panel Maintain med doses  Hyperlipidemia associated with type 2 diabetes mellitus (HCC) Declined to take any statin Repeat lipid panel  Acquired hypothyroidism Repeat Tsh and T4 Maintain euthyrox  dose  Wt Readings from Last 3 Encounters:  10/31/23 256 lb (116.1 kg)  07/29/23 266 lb 9.6 oz (120.9 kg)  04/08/23 264 lb 9.6 oz (120 kg)    Reviewed medical, surgical, and social history today  Medications: Outpatient Medications Prior to Visit  Medication Sig   BLACK CURRANT SEED OIL PO Take 2 capsules by mouth daily.   Continuous Glucose Sensor (FREESTYLE LIBRE 14 DAY SENSOR) MISC 1 Units by Does not apply route every 14 (fourteen) days.   empagliflozin  (JARDIANCE ) 25 MG TABS tablet Take 1 tablet (25 mg total) by mouth daily before breakfast.   glipiZIDE  (GLUCOTROL ) 5 MG tablet TAKE 1 TABLET BY MOUTH TWICE DAILY WITH BREAKFAST AND WITH LUNCH   hydrocortisone  (ANUSOL -HC) 2.5 % rectal cream Place 1 Application rectally 2 (two) times daily.   hydrocortisone  (ANUSOL -HC) 25 MG suppository Place 1 suppository (25 mg total) rectally 2 (two) times daily.   hydrOXYzine  (VISTARIL ) 25 MG capsule Take 1 capsule (25 mg total) by mouth at bedtime as needed.   hyoscyamine  (LEVSIN  SL) 0.125 MG SL tablet Place 1 tablet (0.125 mg total) under the tongue every 6 (six) hours as needed for cramping (nausea, diarrhea).   levothyroxine  (SYNTHROID ) 150 MCG tablet Take 1 tablet (150 mcg total) by mouth daily.   linaclotide  (LINZESS ) 72 MCG capsule Take 1 capsule  (72 mcg total) by mouth daily before breakfast.   metFORMIN  (GLUCOPHAGE -XR) 500 MG 24 hr tablet TAKE 2 TABLETS BY MOUTH TWICE DAILY AFTER A MEAL   Multiple Vitamins-Minerals (EMERGEN-C VITAMIN C PO) Take 1 packet by mouth daily as needed (immune health/support).   naproxen  (NAPROSYN ) 500 MG tablet Take 1 tablet (500 mg total) by mouth 2 (two) times daily as needed.   OVER THE COUNTER MEDICATION Take 2 capsules by mouth daily. Super cell food  Sea moss with bladderwrack and burdock root   sodium chloride  (OCEAN) 0.65 % SOLN nasal spray Place 1 spray into both nostrils daily as needed for congestion.   tiZANidine  (ZANAFLEX ) 4 MG tablet TAKE 1 TABLET BY MOUTH ONCE DAILY AS NEEDED FOR MUSCLE SPASM   No facility-administered medications prior to visit.   Reviewed past medical and social history.   ROS per HPI above      Objective:  BP 114/70 (BP Location: Left Arm, Patient Position: Sitting, Cuff Size: Large)   Pulse 72   Temp 98.4 F (36.9 C) (Oral)   Ht 5' 8 (1.727 m)   Wt 256 lb (116.1 kg)   SpO2 96%   BMI 38.92 kg/m      Physical Exam Vitals and nursing note reviewed.  Cardiovascular:     Rate and Rhythm: Normal rate and regular rhythm.     Pulses: Normal pulses.     Heart sounds: Normal heart  sounds.  Pulmonary:     Effort: Pulmonary effort is normal.     Breath sounds: Normal breath sounds.  Musculoskeletal:     Cervical back: Normal range of motion and neck supple.     Right lower leg: No edema.     Left lower leg: No edema.  Neurological:     Mental Status: She is alert and oriented to person, place, and time.     No results found for any visits on 10/31/23.    Assessment & Plan:    Problem List Items Addressed This Visit     Acquired hypothyroidism   Repeat Tsh and T4 Maintain euthyrox  dose      Relevant Orders   T4, free   TSH   Diabetes (HCC) - Primary   Denies any adverse effects with jardiance , glipizide , and metformin  Advised to schedule  appointment for annual eye exam. BP at goal  Repeat hgbA1c, CMP, lipid panel Maintain med doses      Relevant Orders   Comprehensive metabolic panel with GFR   Hemoglobin A1c   Hyperlipidemia associated with type 2 diabetes mellitus (HCC)   Declined to take any statin Repeat lipid panel      Relevant Orders   Comprehensive metabolic panel with GFR   Lipid panel   Other Visit Diagnoses       Vitamin D  deficiency       Relevant Orders   VITAMIN D  25 Hydroxy (Vit-D Deficiency, Fractures)      Return in about 3 months (around 01/31/2024) for CPE (fasting).     Roselie Mood, NP

## 2023-10-31 NOTE — Assessment & Plan Note (Signed)
 Denies any adverse effects with jardiance , glipizide , and metformin  Advised to schedule appointment for annual eye exam. BP at goal  Repeat hgbA1c, CMP, lipid panel Maintain med doses

## 2023-10-31 NOTE — Assessment & Plan Note (Signed)
 Repeat Tsh and T4 Maintain euthyrox  dose

## 2023-11-01 ENCOUNTER — Ambulatory Visit: Payer: Self-pay | Admitting: Nurse Practitioner

## 2023-11-24 ENCOUNTER — Other Ambulatory Visit: Payer: Self-pay | Admitting: Nurse Practitioner

## 2023-11-24 DIAGNOSIS — G8929 Other chronic pain: Secondary | ICD-10-CM

## 2023-11-24 DIAGNOSIS — E1169 Type 2 diabetes mellitus with other specified complication: Secondary | ICD-10-CM

## 2024-02-10 ENCOUNTER — Telehealth: Payer: Self-pay | Admitting: Nurse Practitioner

## 2024-02-10 ENCOUNTER — Ambulatory Visit: Admitting: Nurse Practitioner

## 2024-02-10 ENCOUNTER — Encounter: Payer: Self-pay | Admitting: Nurse Practitioner

## 2024-02-10 VITALS — BP 132/76 | HR 75 | Ht 68.0 in | Wt 267.8 lb

## 2024-02-10 DIAGNOSIS — E1169 Type 2 diabetes mellitus with other specified complication: Secondary | ICD-10-CM

## 2024-02-10 DIAGNOSIS — Z8049 Family history of malignant neoplasm of other genital organs: Secondary | ICD-10-CM | POA: Insufficient documentation

## 2024-02-10 LAB — MICROALBUMIN / CREATININE URINE RATIO
Creatinine,U: 46.1 mg/dL
Microalb Creat Ratio: 68.9 mg/g — ABNORMAL HIGH (ref 0.0–30.0)
Microalb, Ur: 3.2 mg/dL — ABNORMAL HIGH (ref 0.7–1.9)

## 2024-02-10 LAB — HEMOGLOBIN A1C: Hgb A1c MFr Bld: 7.6 % — ABNORMAL HIGH (ref 4.6–6.5)

## 2024-02-10 NOTE — Patient Instructions (Signed)
 Go to lab Maintain Heart healthy diet and daily exercise. Maintain current medications.

## 2024-02-10 NOTE — Assessment & Plan Note (Addendum)
 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 She is requesting for no med change if hgbA1c <10. She plans to incorporate diet modifications and daily exercise.  Repeat hgbA1c, BMP, LDL, and UACr Maintain med doses F/up in 3-4months

## 2024-02-10 NOTE — Telephone Encounter (Signed)
 Patient was called by Phlebotomist Prudy Sharps, and spoke to her regarding her that she needed to come back due to the lab calling and saying the tube that was sent had a issue

## 2024-02-10 NOTE — Progress Notes (Unsigned)
 "               Established Patient Visit  Patient: Kim Delgado   DOB: 02/18/1975   49 y.o. Female  MRN: [id number] Visit Date: 02/10/2024  Subjective:    Chief Complaint  Patient presents with   Follow-up    FASTING 3 month follow up for DM.  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 She is requesting for no med change if hgbA1c <10. She plans to incorporate diet modifications and daily exercise.  Repeat hgbA1c, BMP, LDL, and UACr Maintain med doses F/up in 3-69months  Reviewed medical, surgical, and social history today  Medications: Show/hide medication list[1] Reviewed past medical and social history.   ROS per HPI above  {Show previous labs (optional):23779}    Objective:  BP 132/76 (BP Location: Left Arm, Patient Position: Sitting, Cuff Size: Large)   Pulse 75   Ht 5' 8 (1.727 m)   Wt 267 lb 12.8 oz (121.5 kg)   SpO2 98%   BMI 40.72 kg/m      Physical Exam Vitals and nursing note reviewed.  Cardiovascular:     Rate and Rhythm: Normal rate and regular rhythm.     Pulses: Normal pulses.     Heart sounds: Normal heart sounds.  Pulmonary:     Effort: Pulmonary effort is normal.     Breath sounds: Normal breath sounds.  Neurological:     Mental Status: She is alert and oriented to person, place, and time.     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 She is requesting for no med change if hgbA1c <10. She plans to incorporate diet modifications and daily exercise.  Repeat hgbA1c, BMP, LDL, and UACr Maintain med doses F/up in 3-85months      Relevant Orders   Hemoglobin A1c (Completed)   Microalbumin / creatinine urine ratio   Renal Function Panel   Family history of cervical cancer   Hyperlipidemia associated with type 2 diabetes mellitus (HCC)   Repeat direct LDL      Relevant Orders   Direct LDL   Return in about 6 months (around 08/09/2024) for HTN, DM, hyperlipidemia (fasting).     Roselie Mood, NP      [1]  Outpatient Medications Prior to Visit  Medication Sig   BLACK CURRANT SEED OIL PO Take 2 capsules by mouth daily.   Continuous Glucose Sensor (FREESTYLE LIBRE 14 DAY SENSOR) MISC 1 Units by Does not apply route every 14 (fourteen) days.   glipiZIDE  (GLUCOTROL ) 5 MG tablet Take 1 tablet (5 mg total) by mouth 2 (two) times daily before a meal.   hydrocortisone  (ANUSOL -HC) 2.5 %  rectal cream Place 1 Application rectally 2 (two) times daily.   hydrocortisone  (ANUSOL -HC) 25 MG suppository Place 1 suppository (25 mg total) rectally 2 (two) times daily.   hydrOXYzine  (VISTARIL ) 25 MG capsule Take 1 capsule (25 mg total) by mouth at bedtime as needed.   hyoscyamine  (LEVSIN  SL) 0.125 MG SL tablet Place 1 tablet (0.125 mg total) under the tongue every 6 (six) hours as needed for cramping (nausea, diarrhea).   levothyroxine  (SYNTHROID ) 150 MCG tablet Take 1 tablet (150 mcg total) by mouth daily before breakfast.   linaclotide  (LINZESS ) 72 MCG capsule Take 1 capsule (72 mcg total) by mouth daily before breakfast.   metFORMIN  (GLUCOPHAGE -XR) 500 MG 24 hr tablet Take 2 tablets (1,000 mg total) by mouth 2 (two) times daily with a meal.   Multiple Vitamins-Minerals (EMERGEN-C  VITAMIN C PO) Take 1 packet by mouth daily as needed (immune health/support).   naproxen  (NAPROSYN ) 500 MG tablet Take 1 tablet (500 mg total) by mouth 2 (two) times daily as needed.   OVER THE COUNTER MEDICATION Take 2 capsules by mouth daily. Super cell food  Sea moss with bladderwrack and burdock root   sodium chloride  (OCEAN) 0.65 % SOLN nasal spray Place 1 spray into both nostrils daily as needed for congestion.   tiZANidine  (ZANAFLEX ) 4 MG tablet TAKE 1 TABLET BY MOUTH ONCE DAILY AS NEEDED FOR MUSCLE SPASM   [DISCONTINUED] empagliflozin  (JARDIANCE ) 25 MG TABS tablet Take 1 tablet (25 mg total) by mouth daily before breakfast.   No facility-administered medications prior to visit.   "

## 2024-02-10 NOTE — Assessment & Plan Note (Signed)
Repeat direct LDL

## 2024-08-09 ENCOUNTER — Ambulatory Visit: Admitting: Nurse Practitioner
